# Patient Record
Sex: Male | Born: 1960 | Race: Black or African American | Hispanic: No | Smoking: Never smoker
Health system: Southern US, Community
[De-identification: ages and names within clinical notes are randomized; demographics above are authoritative.]

## PROBLEM LIST (undated history)

## (undated) DIAGNOSIS — I10 Essential (primary) hypertension: Secondary | ICD-10-CM

## (undated) DIAGNOSIS — M109 Gout, unspecified: Secondary | ICD-10-CM

## (undated) DIAGNOSIS — I2699 Other pulmonary embolism without acute cor pulmonale: Secondary | ICD-10-CM

## (undated) DIAGNOSIS — E119 Type 2 diabetes mellitus without complications: Secondary | ICD-10-CM

## (undated) HISTORY — DX: Type 2 diabetes mellitus without complications: E11.9

## (undated) HISTORY — PX: ROTATOR CUFF REPAIR: SHX139

---

## 1997-06-23 ENCOUNTER — Emergency Department (HOSPITAL_COMMUNITY): Admission: EM | Admit: 1997-06-23 | Discharge: 1997-06-23 | Payer: Self-pay | Admitting: Emergency Medicine

## 1997-06-30 ENCOUNTER — Encounter: Admission: RE | Admit: 1997-06-30 | Discharge: 1997-06-30 | Payer: Self-pay | Admitting: Internal Medicine

## 1997-07-07 ENCOUNTER — Encounter: Admission: RE | Admit: 1997-07-07 | Discharge: 1997-07-07 | Payer: Self-pay | Admitting: Hematology and Oncology

## 1997-07-10 ENCOUNTER — Ambulatory Visit: Admission: RE | Admit: 1997-07-10 | Discharge: 1997-07-10 | Payer: Self-pay

## 1997-07-16 ENCOUNTER — Encounter: Admission: RE | Admit: 1997-07-16 | Discharge: 1997-07-16 | Payer: Self-pay | Admitting: Hematology and Oncology

## 1997-09-28 ENCOUNTER — Encounter: Payer: Self-pay | Admitting: Emergency Medicine

## 1997-09-28 ENCOUNTER — Emergency Department (HOSPITAL_COMMUNITY): Admission: EM | Admit: 1997-09-28 | Discharge: 1997-09-28 | Payer: Self-pay | Admitting: Emergency Medicine

## 1997-12-24 ENCOUNTER — Emergency Department (HOSPITAL_COMMUNITY): Admission: EM | Admit: 1997-12-24 | Discharge: 1997-12-24 | Payer: Self-pay | Admitting: Emergency Medicine

## 1999-03-07 ENCOUNTER — Emergency Department (HOSPITAL_COMMUNITY): Admission: EM | Admit: 1999-03-07 | Discharge: 1999-03-07 | Payer: Self-pay | Admitting: Internal Medicine

## 1999-06-21 ENCOUNTER — Emergency Department (HOSPITAL_COMMUNITY): Admission: EM | Admit: 1999-06-21 | Discharge: 1999-06-21 | Payer: Self-pay | Admitting: Emergency Medicine

## 1999-07-13 ENCOUNTER — Emergency Department (HOSPITAL_COMMUNITY): Admission: EM | Admit: 1999-07-13 | Discharge: 1999-07-13 | Payer: Self-pay | Admitting: Internal Medicine

## 2000-08-24 ENCOUNTER — Encounter: Payer: Self-pay | Admitting: Emergency Medicine

## 2000-08-24 ENCOUNTER — Emergency Department (HOSPITAL_COMMUNITY): Admission: EM | Admit: 2000-08-24 | Discharge: 2000-08-24 | Payer: Self-pay | Admitting: Emergency Medicine

## 2000-09-04 ENCOUNTER — Encounter: Admission: RE | Admit: 2000-09-04 | Discharge: 2000-12-03 | Payer: Self-pay | Admitting: Family Medicine

## 2002-03-19 ENCOUNTER — Emergency Department (HOSPITAL_COMMUNITY): Admission: EM | Admit: 2002-03-19 | Discharge: 2002-03-19 | Payer: Self-pay

## 2002-04-06 ENCOUNTER — Emergency Department (HOSPITAL_COMMUNITY): Admission: EM | Admit: 2002-04-06 | Discharge: 2002-04-06 | Payer: Self-pay | Admitting: Emergency Medicine

## 2004-08-01 ENCOUNTER — Emergency Department (HOSPITAL_COMMUNITY): Admission: EM | Admit: 2004-08-01 | Discharge: 2004-08-01 | Payer: Self-pay | Admitting: Family Medicine

## 2005-03-06 ENCOUNTER — Encounter: Admission: RE | Admit: 2005-03-06 | Discharge: 2005-03-06 | Payer: Self-pay | Admitting: Family Medicine

## 2005-04-03 ENCOUNTER — Emergency Department (HOSPITAL_COMMUNITY): Admission: EM | Admit: 2005-04-03 | Discharge: 2005-04-03 | Payer: Self-pay | Admitting: Emergency Medicine

## 2005-09-28 ENCOUNTER — Emergency Department (HOSPITAL_COMMUNITY): Admission: EM | Admit: 2005-09-28 | Discharge: 2005-09-28 | Payer: Self-pay | Admitting: Emergency Medicine

## 2005-10-21 ENCOUNTER — Inpatient Hospital Stay (HOSPITAL_COMMUNITY): Admission: EM | Admit: 2005-10-21 | Discharge: 2005-10-27 | Payer: Self-pay | Admitting: Emergency Medicine

## 2005-10-22 ENCOUNTER — Encounter: Payer: Self-pay | Admitting: Vascular Surgery

## 2005-12-26 ENCOUNTER — Emergency Department (HOSPITAL_COMMUNITY): Admission: EM | Admit: 2005-12-26 | Discharge: 2005-12-26 | Payer: Self-pay | Admitting: Emergency Medicine

## 2006-04-03 ENCOUNTER — Ambulatory Visit (HOSPITAL_BASED_OUTPATIENT_CLINIC_OR_DEPARTMENT_OTHER): Admission: RE | Admit: 2006-04-03 | Discharge: 2006-04-03 | Payer: Self-pay | Admitting: Orthopedic Surgery

## 2006-06-19 ENCOUNTER — Encounter: Admission: RE | Admit: 2006-06-19 | Discharge: 2006-06-19 | Payer: Self-pay | Admitting: Family Medicine

## 2006-09-08 ENCOUNTER — Emergency Department (HOSPITAL_COMMUNITY): Admission: EM | Admit: 2006-09-08 | Discharge: 2006-09-08 | Payer: Self-pay | Admitting: Emergency Medicine

## 2006-10-09 ENCOUNTER — Emergency Department (HOSPITAL_COMMUNITY): Admission: EM | Admit: 2006-10-09 | Discharge: 2006-10-09 | Payer: Self-pay | Admitting: Emergency Medicine

## 2007-02-14 ENCOUNTER — Emergency Department (HOSPITAL_COMMUNITY): Admission: EM | Admit: 2007-02-14 | Discharge: 2007-02-15 | Payer: Self-pay | Admitting: Emergency Medicine
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
## 2007-03-17 IMAGING — CR DG CHEST 2V
2 series · 2 of 2 positions shown · non-contrast
Comparison: None.

CLINICAL DATA: 44-year-old with difficulty breathing and shortness of breath. Chest pain.
 CHEST - 2 VIEW:

[view not recorded (1 of 2)]
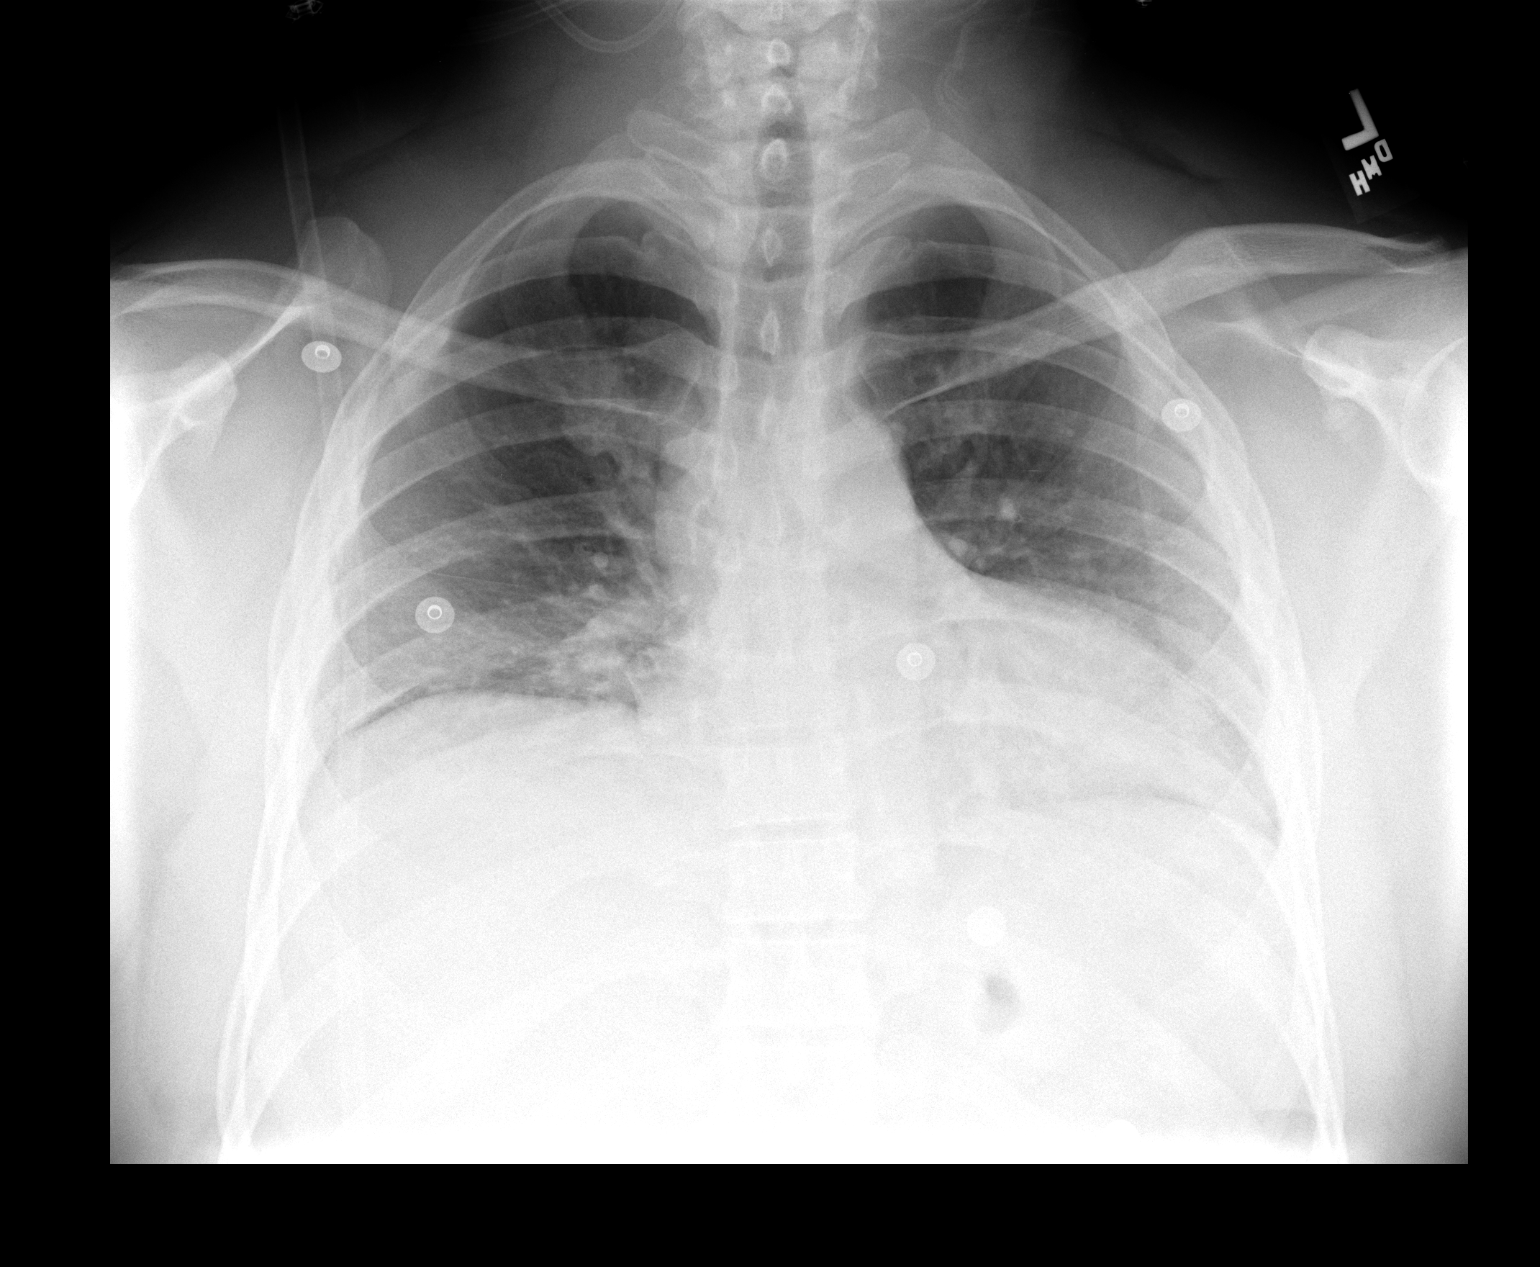

[view not recorded (2 of 2)]
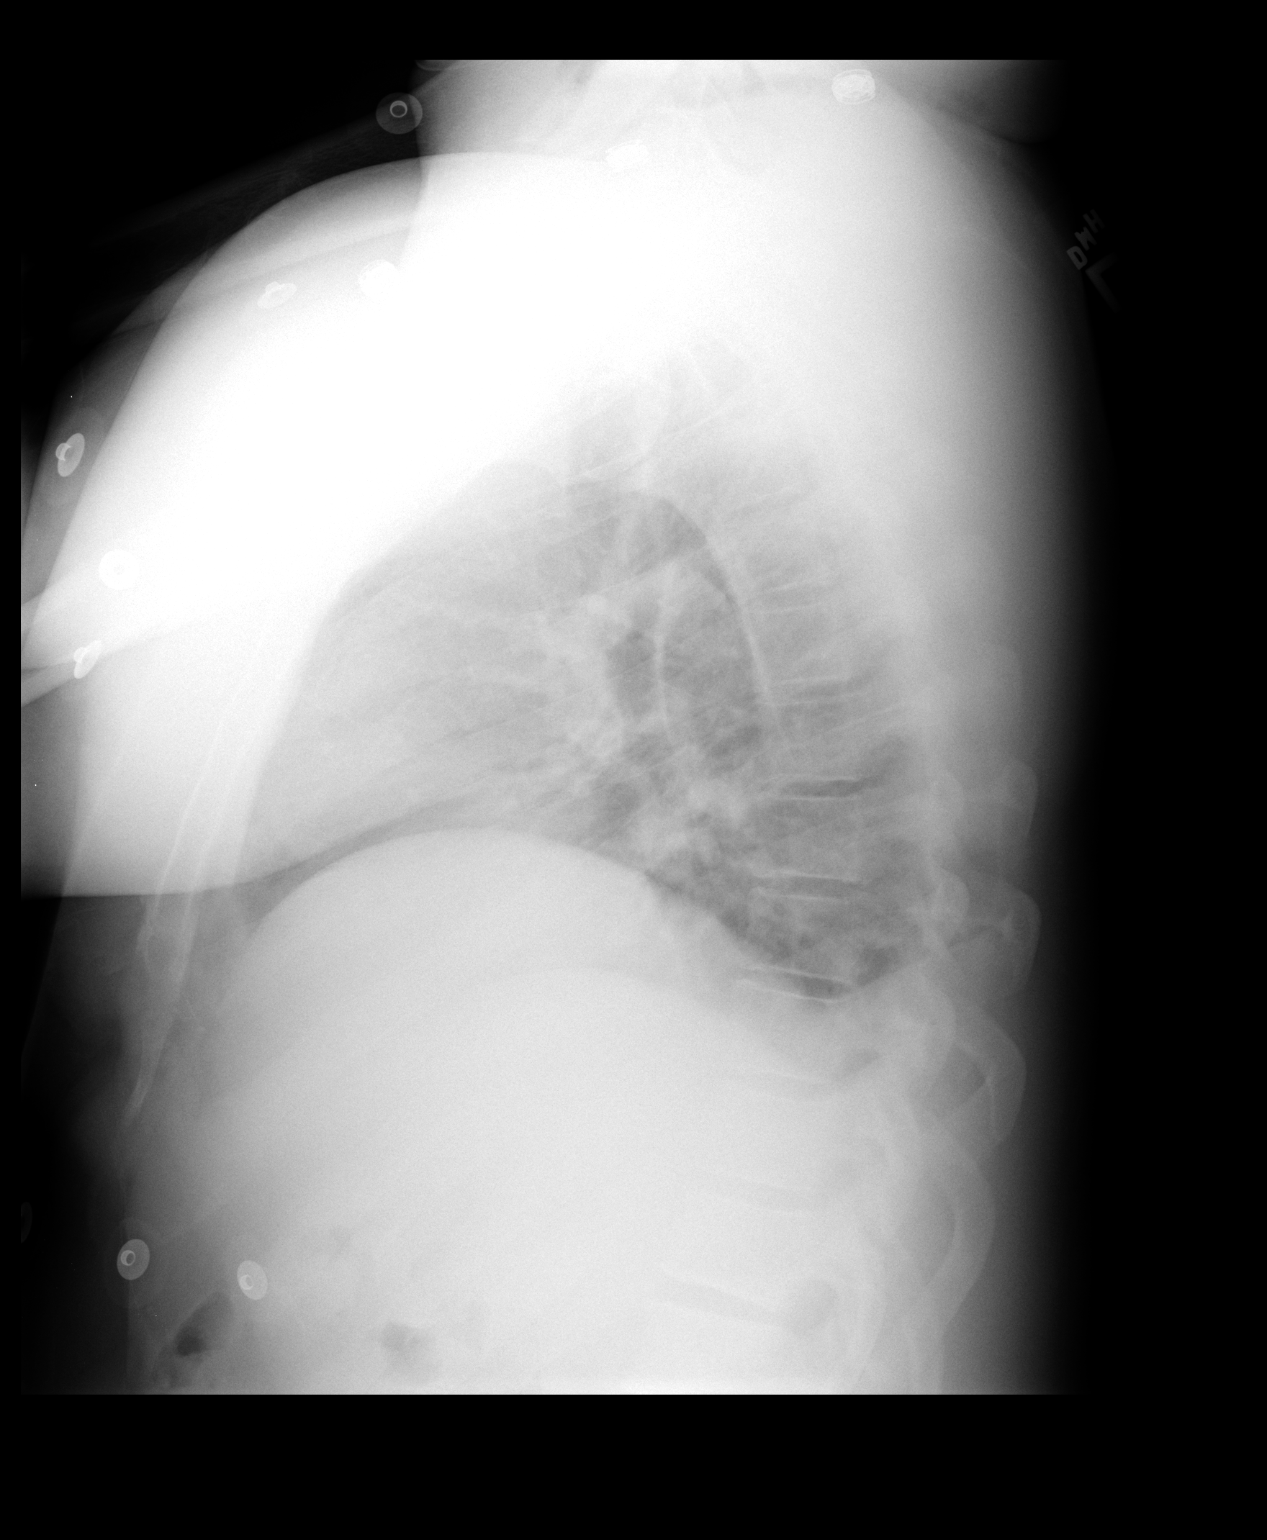

[2 of 2 positions shown; findings below may reference images not displayed]

FINDINGS: Very low volume chest film with vascular crowding, bibasilar atelectasis, and accentuation of heart size. No edema or effusions. Remote healed left clavicle fracture is noted.
IMPRESSION: Very low volume chest film with vascular crowding, bibasilar atelectasis, and accentuation of heart size.

## 2007-12-03 ENCOUNTER — Emergency Department (HOSPITAL_COMMUNITY): Admission: EM | Admit: 2007-12-03 | Discharge: 2007-12-03 | Payer: Self-pay | Admitting: Emergency Medicine

## 2008-10-06 ENCOUNTER — Encounter: Admission: RE | Admit: 2008-10-06 | Discharge: 2008-10-06 | Payer: Self-pay | Admitting: Family Medicine

## 2008-10-08 ENCOUNTER — Encounter: Admission: RE | Admit: 2008-10-08 | Discharge: 2008-11-19 | Payer: Self-pay | Admitting: Family Medicine

## 2009-04-07 ENCOUNTER — Emergency Department (HOSPITAL_COMMUNITY): Admission: EM | Admit: 2009-04-07 | Discharge: 2009-04-07 | Payer: Self-pay | Admitting: Family Medicine

## 2009-05-15 ENCOUNTER — Emergency Department (HOSPITAL_COMMUNITY): Admission: EM | Admit: 2009-05-15 | Discharge: 2009-05-15 | Payer: Self-pay | Admitting: Emergency Medicine

## 2009-07-12 ENCOUNTER — Emergency Department (HOSPITAL_COMMUNITY): Admission: EM | Admit: 2009-07-12 | Discharge: 2009-07-12 | Payer: Self-pay | Admitting: Emergency Medicine

## 2010-03-22 LAB — CBC
HCT: 38.9 % — ABNORMAL LOW (ref 39.0–52.0)
Hemoglobin: 12.9 g/dL — ABNORMAL LOW (ref 13.0–17.0)
Platelets: 213 10*3/uL (ref 150–400)
RDW: 13.5 % (ref 11.5–15.5)
WBC: 5.8 10*3/uL (ref 4.0–10.5)

## 2010-03-22 LAB — DIFFERENTIAL
Basophils Absolute: 0.1 10*3/uL (ref 0.0–0.1)
Eosinophils Relative: 1 % (ref 0–5)
Lymphocytes Relative: 40 % (ref 12–46)
Lymphs Abs: 2.3 10*3/uL (ref 0.7–4.0)
Neutro Abs: 3 10*3/uL (ref 1.7–7.7)

## 2010-03-22 LAB — URIC ACID: Uric Acid, Serum: 7.5 mg/dL (ref 4.0–7.8)

## 2010-03-22 LAB — SEDIMENTATION RATE: Sed Rate: 19 mm/hr — ABNORMAL HIGH (ref 0–16)
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
## 2010-09-23 LAB — CBC
HCT: 47.8
Platelets: 243
RDW: 13.1

## 2010-09-23 LAB — URINALYSIS, ROUTINE W REFLEX MICROSCOPIC
Hgb urine dipstick: NEGATIVE
Leukocytes, UA: NEGATIVE
Nitrite: NEGATIVE
Protein, ur: 100 — AB
Specific Gravity, Urine: 1.024
Urobilinogen, UA: 1

## 2010-09-23 LAB — URINE MICROSCOPIC-ADD ON

## 2010-09-23 LAB — BASIC METABOLIC PANEL
BUN: 19
Calcium: 9
Creatinine, Ser: 1.44
GFR calc non Af Amer: 53 — ABNORMAL LOW
Glucose, Bld: 158 — ABNORMAL HIGH
Potassium: 3.3 — ABNORMAL LOW

## 2010-09-23 LAB — DIFFERENTIAL
Basophils Absolute: 0.1
Eosinophils Relative: 0
Lymphocytes Relative: 24
Neutro Abs: 8.5 — ABNORMAL HIGH

## 2010-10-13 LAB — RPR: RPR Ser Ql: NONREACTIVE

## 2011-05-01 ENCOUNTER — Emergency Department (HOSPITAL_COMMUNITY)
Admission: EM | Admit: 2011-05-01 | Discharge: 2011-05-01 | Disposition: A | Discharge status: Home / Self Care | Payer: Self-pay | Attending: Emergency Medicine | Admitting: Emergency Medicine

## 2011-05-01 ENCOUNTER — Encounter (HOSPITAL_COMMUNITY): Payer: Self-pay | Admitting: *Deleted

## 2011-05-01 DIAGNOSIS — IMO0001 Reserved for inherently not codable concepts without codable children: Secondary | ICD-10-CM | POA: Insufficient documentation

## 2011-05-01 DIAGNOSIS — I10 Essential (primary) hypertension: Secondary | ICD-10-CM | POA: Insufficient documentation

## 2011-05-01 DIAGNOSIS — L0231 Cutaneous abscess of buttock: Secondary | ICD-10-CM | POA: Insufficient documentation

## 2011-05-01 DIAGNOSIS — L03317 Cellulitis of buttock: Secondary | ICD-10-CM | POA: Insufficient documentation

## 2011-05-01 DIAGNOSIS — R609 Edema, unspecified: Secondary | ICD-10-CM | POA: Insufficient documentation

## 2011-05-01 DIAGNOSIS — Z79899 Other long term (current) drug therapy: Secondary | ICD-10-CM | POA: Insufficient documentation

## 2011-05-01 HISTORY — DX: Essential (primary) hypertension: I10

## 2011-05-01 MED ORDER — ATENOLOL 100 MG PO TABS
100.0000 mg | ORAL_TABLET | Freq: Once | ORAL | Status: AC
  Administered 2011-05-01: 100 mg via ORAL
  Filled 2011-05-01: qty 1

## 2011-05-01 MED ORDER — TRAMADOL HCL 50 MG PO TABS: 50.0000 mg | ORAL_TABLET | Freq: Four times a day (QID) | ORAL | Status: AC | PRN

## 2011-05-01 MED ORDER — SULFAMETHOXAZOLE-TRIMETHOPRIM 800-160 MG PO TABS: 1.0000 | ORAL_TABLET | Freq: Two times a day (BID) | ORAL | Status: AC

## 2011-05-01 MED ORDER — CEPHALEXIN 500 MG PO CAPS: 500.0000 mg | ORAL_CAPSULE | Freq: Four times a day (QID) | ORAL | Status: AC

## 2011-05-01 NOTE — ED Notes (Signed)
Pt in c/o abscess x3 weeks, over last few days new drainage noted, abscess to buttock area

## 2011-05-01 NOTE — Discharge Instructions (Signed)
Return in 2-3 days if your abscess condition worsen. Continue with sitz bath, changing dressing at least twice daily, and taking antibiotic as prescribed.  You will also need to take your blood pressure medications as your blood pressure is high today.  Follow up with your doctor for further management.     Abscess Care After An abscess (also called a boil or furuncle) is an infected area that contains a collection of pus. Signs and symptoms of an abscess include pain, tenderness, redness, or hardness, or you may feel a moveable soft area under your skin. An abscess can occur anywhere in the body. The infection may spread to surrounding tissues causing cellulitis. A cut (incision) by the surgeon was made over your abscess and the pus was drained out. Gauze may have been packed into the space to provide a drain that will allow the cavity to heal from the inside outwards. The boil may be painful for 5 to 7 days. Most people with a boil do not have high fevers. Your abscess, if seen early, may not have localized, and may not have been lanced. If not, another appointment may be required for this if it does not get better on its own or with medications. HOME CARE INSTRUCTIONS   Only take over-the-counter or prescription medicines for pain, discomfort, or fever as directed by your caregiver.   When you bathe, soak and then remove gauze or iodoform packs at least daily or as directed by your caregiver. You may then wash the wound gently with mild soapy water. Repack with gauze or do as your caregiver directs.  SEEK IMMEDIATE MEDICAL CARE IF:   You develop increased pain, swelling, redness, drainage, or bleeding in the wound site.   You develop signs of generalized infection including muscle aches, chills, fever, or a general ill feeling.   An oral temperature above 102 F (38.9 C) develops, not controlled by medication.  See your caregiver for a recheck if you develop any of the symptoms described above.  If medications (antibiotics) were prescribed, take them as directed. Document Released: 07/07/2004 Document Revised: 12/08/2010 Document Reviewed: 03/04/2007 [facility] Patient Information 2012 [place], [place].Sitz Bath A sitz bath is a warm water bath taken in the sitting position that covers only the hips and buttocks. It may be used for either healing or hygiene purposes. Sitz baths are also used to relieve pain, itching, or muscle spasms. The water may contain medicine. Moist heat will help you heal and relax.  HOME CARE INSTRUCTIONS   Fill the bathtub half full with warm water.   Sit in the water and open the drain a little.   Turn on the warm water to keep the tub half full. Keep the water running constantly.   Soak in the water for 15 to 20 minutes.   After the sitz bath, pat the affected area dry first.   Take 3 to 4 sitz baths a day.  SEEK MEDICAL CARE IF:  You get worse instead of better. Stop the sitz baths if you get worse. MAKE SURE YOU:  Understand these instructions.   Will watch your condition.   Will get help right away if you are not doing well or get worse.  Document Released: 09/11/2003 Document Revised: 12/08/2010 Document Reviewed: 03/18/2010 [facility] Patient Information 2012 [place], [place].

## 2011-05-01 NOTE — ED Provider Notes (Signed)
History     CSN: [id number]  Arrival date & time 05/01/11  1450   First MD Initiated Contact with Patient 05/01/11 1527      Chief Complaint  Patient presents with  . Abscess    (Consider location/radiation/quality/duration/timing/severity/associated sxs/prior treatment) HPI  51 year old male presents with chief complaints of abscess. Patient states for the past 3 weeks he has been having pain and swelling to his L buttock.  States that abscess has increased in size and tenderness but a few days ago it has ruptured on its own and has been draining pus and blood. He has noticed that the pain has improved. He denies fever, rectal pain, or abdominal pain. He denies any prior trauma. 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  Has prior hx of abscess in leg and arm in the past. His primary concern is the bloody drainage.  His wife has been changing dressing several times daily.    He takes ibuprofen for pain.  Pt also were found to have an elevated BP of 220/125 today.  He denies headache, chest pain, shortness of breath, numbness or tingling sensation.  Pt sts he works early in the morning and usually takes his medications in the dark.  He believes he may have been taking wrong medication.  Admits to take lisinopril daily but thinks he hasn't been taking his atenolol as prescribed.  Does have medication at home.  Pt does have a PCP to f/u.      Past Medical History  Diagnosis Date  . Hypertension     History reviewed. No pertinent past surgical history.  History reviewed. No pertinent family history.  History  Substance Use Topics  . Smoking status: Not on file  . Smokeless tobacco: Not on file  . Alcohol Use:       Review of Systems  All other systems reviewed and are negative.    Allergies  Iohexol  Home Medications   Current Outpatient Rx  Name Route Sig Dispense Refill  . ATENOLOL 100 MG PO TABS Oral Take 100 mg by mouth  daily.    . IBUPROFEN 200 MG PO TABS Oral Take 800 mg by mouth every 6 (six) hours as needed. PAIN    . LISINOPRIL-HYDROCHLOROTHIAZIDE 20-25 MG PO TABS Oral Take 1 tablet by mouth daily.    . ADULT MULTIVITAMIN W/MINERALS CH Oral Take 1 tablet by mouth daily.    Marland Kitchen NIACIN ER 500 MG PO CPCR Oral Take 500 mg by mouth at bedtime.      BP 220/125  Pulse 65  Temp(Src) 98.1 F (36.7 C) (Oral)  Resp 20  SpO2 99%  Physical Exam  Nursing note and vitals reviewed. Constitutional: He appears well-developed and well-nourished. No distress.  HENT:  Head: Atraumatic.  Eyes: Conjunctivae are normal.  Neck: Neck supple.  Cardiovascular: Normal rate and regular rhythm.   Pulmonary/Chest: Effort normal. He has no wheezes. He exhibits no tenderness.  Abdominal: Soft. There is no tenderness.  Musculoskeletal: Normal range of motion. He exhibits no edema.  Neurological: He is alert.  Skin: Skin is warm.       ED Course  Procedures (including critical care time)  Labs Reviewed - No data to display No results found.   No diagnosis found.    MDM  R upper buttock with a drainable abscess.  No significant cellulitis.  Able to expel serous sanguinous with mixture of pus with manual manipulation.  No obvious fluctuance on exam.  No lymphangitis.  No rectal involvement.  No requirement for I&D at this time.  Plan to offer abx and f/u instruction.  Pt amendable to plan.  Sitz bath recommended.     Patient has an elevated blood pressure of 220/125. He does have history of hypertension. He does have hypertension medication at home but states he has been taking it as prescribed. There is no evidence of end organ damage. Since he misses his morning dose of atenolol, will give a dose here and encourage resuming his meds tomorrow. Recommend f/u with PCP for further management.  Pt voice understanding.    4:29 PM Blood pressures improves. will discharge.    Fayrene Helper, PA-C 05/01/11 1629

## 2011-05-02 NOTE — ED Provider Notes (Signed)
Medical screening examination/treatment/procedure(s) were performed by non-physician practitioner and as supervising physician I was immediately available for consultation/collaboration.   Rolan Bucco, MD 05/02/11 0010

## 2012-10-24 ENCOUNTER — Emergency Department (HOSPITAL_COMMUNITY): Payer: Self-pay

## 2012-10-24 ENCOUNTER — Encounter (HOSPITAL_COMMUNITY): Payer: Self-pay | Admitting: Emergency Medicine

## 2012-10-24 ENCOUNTER — Emergency Department (HOSPITAL_COMMUNITY)
Admission: EM | Admit: 2012-10-24 | Discharge: 2012-10-24 | Disposition: A | Discharge status: Home / Self Care | Payer: Self-pay | Attending: Emergency Medicine | Admitting: Emergency Medicine

## 2012-10-24 DIAGNOSIS — M25469 Effusion, unspecified knee: Secondary | ICD-10-CM | POA: Insufficient documentation

## 2012-10-24 DIAGNOSIS — I10 Essential (primary) hypertension: Secondary | ICD-10-CM | POA: Insufficient documentation

## 2012-10-24 DIAGNOSIS — IMO0001 Reserved for inherently not codable concepts without codable children: Secondary | ICD-10-CM | POA: Insufficient documentation

## 2012-10-24 DIAGNOSIS — M25562 Pain in left knee: Secondary | ICD-10-CM

## 2012-10-24 DIAGNOSIS — M25569 Pain in unspecified knee: Secondary | ICD-10-CM | POA: Insufficient documentation

## 2012-10-24 DIAGNOSIS — Z79899 Other long term (current) drug therapy: Secondary | ICD-10-CM | POA: Insufficient documentation

## 2012-10-24 DIAGNOSIS — M25462 Effusion, left knee: Secondary | ICD-10-CM

## 2012-10-24 MED ORDER — TRAMADOL HCL 50 MG PO TABS: 50.0000 mg | ORAL_TABLET | Freq: Four times a day (QID) | ORAL | Status: DC | PRN

## 2012-10-24 MED ORDER — NAPROXEN 500 MG PO TABS
500.0000 mg | ORAL_TABLET | Freq: Once | ORAL | Status: AC
  Administered 2012-10-24: 500 mg via ORAL
  Filled 2012-10-24: qty 1

## 2012-10-24 MED ORDER — NAPROXEN 500 MG PO TABS: 500.0000 mg | ORAL_TABLET | Freq: Two times a day (BID) | ORAL | Status: DC

## 2012-10-24 NOTE — Progress Notes (Signed)
[facility] provided pt with a list of primary care resources and a [facility] [employer] Card application.  °

## 2012-10-24 NOTE — ED Provider Notes (Signed)
CSN: [id number]     Arrival date & time 10/24/12  1015 History   First MD Initiated Contact with Patient 10/24/12 1037     Chief Complaint  Patient presents with  . Knee Pain    left   (Consider location/radiation/quality/duration/timing/severity/associated sxs/prior Treatment) HPI Pt is a 52yo male c/o of persistent left knee pain associated with increased pain and swelling after twisting it 1 week ago.  Pain is constant, aching, throbbing, 8/10, worse with ambulation or standing after prolonged sitting. Describes knee as "stiff feeling."  Has tried ice and ibuprofen w/o relief. Reports he does do a lot of walking at work but no heavy lifting.  Knee is more sore when he gets home from work. Denies hx of previous injury to same knee. Denies hx of gout, fever, n/v/d.  Past Medical History  Diagnosis Date  . Hypertension    History reviewed. No pertinent past surgical history. No family history on file. History  Substance Use Topics  . Smoking status: Never Smoker   . Smokeless tobacco: Not on file  . Alcohol Use: Yes    Review of Systems  Constitutional: Negative for fever, chills and fatigue.  Musculoskeletal: Positive for arthralgias, joint swelling and myalgias. Negative for back pain and gait problem.  Skin: Negative for wound.  All other systems reviewed and are negative.    Allergies  Iohexol  Home Medications   Current Outpatient Rx  Name  Route  Sig  Dispense  Refill  . atenolol (TENORMIN) 100 MG tablet   Oral   Take 100 mg by mouth daily.         Marland Kitchen ibuprofen (ADVIL,MOTRIN) 200 MG tablet   Oral   Take 800 mg by mouth every 6 (six) hours as needed. PAIN         . lisinopril-hydrochlorothiazide (PRINZIDE,ZESTORETIC) 20-25 MG per tablet   Oral   Take 1 tablet by mouth daily.         . naproxen (NAPROSYN) 500 MG tablet   Oral   Take 1 tablet (500 mg total) by mouth 2 (two) times daily with a meal.   30 tablet   0   . traMADol (ULTRAM) 50 MG tablet   Oral   Take 1 tablet (50 mg total) by mouth every 6 (six) hours as needed for pain.   15 tablet   0    BP 158/103  Pulse 86  Temp(Src) 98.6 F (37 C)  Resp 20  SpO2 98% Physical Exam  Nursing note and vitals reviewed. Constitutional: He is oriented to person, place, and time. He appears well-developed and well-nourished.  HENT:  Head: Normocephalic and atraumatic.  Eyes: EOM are normal.  Neck: Normal range of motion.  Cardiovascular: Normal rate.   Pulmonary/Chest: Effort normal.  Musculoskeletal: Normal range of motion. He exhibits edema ( mild edema of left lateral knee) and tenderness ( left superior, lateral knee).       Legs: Neurological: He is alert and oriented to person, place, and time.  Skin: Skin is warm and dry. No erythema.  No erythema or warmth of left knee. Skin in tact.  Psychiatric: He has a normal mood and affect. His behavior is normal.    ED Course  Procedures (including critical care time) Labs Review Labs Reviewed - No data to display Imaging Review Dg Knee Complete 4 Views Left  10/24/2012   CLINICAL DATA:  Twisting injury 1 week ago with increasing pain and swelling.  EXAM: LEFT KNEE -  COMPLETE 4+ VIEW  COMPARISON:  Left knee radiographs 10/06/2008.  FINDINGS: The mineralization and alignment are normal. There is no evidence of acute fracture or dislocation. Bipartite patella appears stable. There is a new moderate size knee joint effusion. There appears to be mild subchondral cyst formation involving 1 of the femoral condyles posteriorly on the lateral view.  IMPRESSION: No acute osseous findings. Moderate size knee joint effusion may be a manifestation of internal derangement.   Electronically Signed   By: Roxy Horseman M.D.   On: 10/24/2012 11:51    EKG Interpretation   None       MDM   1. Left knee pain   2. Joint effusion, knee, left    Left knee pain and mild swelling after twisting 1 week ago. No hx of previous injury or gout. No  evidence of gout or cellulitis on exam.  Will get plain films.  Rx: knee sleeve, tramadol, ibuprofen, and [facility] f/u. Pt verbalized understanding and agreement with tx plan.    Junius Finner, PA-C 10/24/12 1828

## 2012-10-24 NOTE — ED Notes (Signed)
Pt states he twisted knee about 1 week ago and now states that knee is swelling up and painful.

## 2012-10-25 NOTE — ED Provider Notes (Signed)
Medical screening examination/treatment/procedure(s) were performed by non-physician practitioner and as supervising physician I was immediately available for consultation/collaboration.  EKG Interpretation   None         Roney Marion, MD 10/25/12 [phone]

## 2014-01-16 ENCOUNTER — Encounter (HOSPITAL_COMMUNITY): Payer: Self-pay | Admitting: Emergency Medicine

## 2014-01-16 ENCOUNTER — Emergency Department (HOSPITAL_COMMUNITY)
Admission: EM | Admit: 2014-01-16 | Discharge: 2014-01-16 | Disposition: A | Discharge status: Home / Self Care | Payer: Self-pay | Attending: Emergency Medicine | Admitting: Emergency Medicine

## 2014-01-16 DIAGNOSIS — Z79899 Other long term (current) drug therapy: Secondary | ICD-10-CM | POA: Insufficient documentation

## 2014-01-16 DIAGNOSIS — I10 Essential (primary) hypertension: Secondary | ICD-10-CM | POA: Insufficient documentation

## 2014-01-16 DIAGNOSIS — K0889 Other specified disorders of teeth and supporting structures: Secondary | ICD-10-CM

## 2014-01-16 DIAGNOSIS — K088 Other specified disorders of teeth and supporting structures: Secondary | ICD-10-CM | POA: Insufficient documentation

## 2014-01-16 MED ORDER — OXYCODONE-ACETAMINOPHEN 5-325 MG PO TABS: 2.0000 | ORAL_TABLET | ORAL | Status: DC | PRN

## 2014-01-16 MED ORDER — PENICILLIN V POTASSIUM 500 MG PO TABS: 500.0000 mg | ORAL_TABLET | Freq: Four times a day (QID) | ORAL | Status: AC

## 2014-01-16 MED ORDER — AMLODIPINE BESYLATE 10 MG PO TABS: 10.0000 mg | ORAL_TABLET | Freq: Once | ORAL | Status: DC

## 2014-01-16 MED ORDER — AMLODIPINE BESYLATE 10 MG PO TABS
10.0000 mg | ORAL_TABLET | Freq: Once | ORAL | Status: AC
  Administered 2014-01-16: 10 mg via ORAL
  Filled 2014-01-16: qty 1

## 2014-01-16 MED ORDER — CLONIDINE HCL 0.1 MG PO TABS
0.2000 mg | ORAL_TABLET | Freq: Three times a day (TID) | ORAL | Status: DC | PRN
  Administered 2014-01-16: 0.2 mg via ORAL
  Filled 2014-01-16: qty 2

## 2014-01-16 MED ORDER — BUPIVACAINE-EPINEPHRINE (PF) 0.5% -1:200000 IJ SOLN
1.8000 mL | Freq: Once | INTRAMUSCULAR | Status: AC
  Administered 2014-01-16: 1.8 mL
  Filled 2014-01-16: qty 1.8

## 2014-01-16 NOTE — ED Notes (Signed)
Pt reports dental pain x 3 weeks that is worse the past two day. Pain is lower dental pain with broken tooth and pain to right side of face. Pain 10/10.

## 2014-01-16 NOTE — ED Notes (Signed)
Per telephone.  OK to d/c pt home with elevated bp.  Notified of 180/118.  Give script for home medication to control.

## 2014-01-16 NOTE — ED Notes (Signed)
Pt bp elevated 187/117 and reports being off pain meds for 1 year.

## 2014-01-16 NOTE — Discharge Instructions (Signed)
Dental Care and Dentist Visits Dental care supports good overall health. Regular dental visits can also help you avoid dental pain, bleeding, infection, and other more serious health problems in the future. It is important to keep the mouth healthy because diseases in the teeth, gums, and other oral tissues can spread to other areas of the body. Some problems, such as diabetes, heart disease, and pre-term labor have been associated with poor oral health.  See your dentist every 6 months. If you experience emergency problems such as a toothache or broken tooth, go to the dentist right away. If you see your dentist regularly, you may catch problems early. It is easier to be treated for problems in the early stages.  WHAT TO EXPECT AT A DENTIST VISIT  Your dentist will look for many common oral health problems and recommend proper treatment. At your regular dental visit, you can expect:  Gentle cleaning of the teeth and gums. This includes scraping and polishing. This helps to remove the sticky substance around the teeth and gums (plaque). Plaque forms in the mouth shortly after eating. Over time, plaque hardens on the teeth as tartar. If tartar is not removed regularly, it can cause problems. Cleaning also helps remove stains.  Periodic X-rays. These pictures of the teeth and supporting bone will help your dentist assess the health of your teeth.  Periodic fluoride treatments. Fluoride is a natural mineral shown to help strengthen teeth. Fluoride treatmentinvolves applying a fluoride gel or varnish to the teeth. It is most commonly done in children.  Examination of the mouth, tongue, jaws, teeth, and gums to look for any oral health problems, such as:  Cavities (dental caries). This is decay on the tooth caused by plaque, sugar, and acid in the mouth. It is best to catch a cavity when it is small.  Inflammation of the gums caused by plaque buildup (gingivitis).  Problems with the mouth or malformed  or misaligned teeth.  Oral cancer or other diseases of the soft tissues or jaws. KEEP YOUR TEETH AND GUMS HEALTHY For healthy teeth and gums, follow these general guidelines as well as your dentist's specific advice:  Have your teeth professionally cleaned at the dentist every 6 months.  Brush twice daily with a fluoride toothpaste.  Floss your teeth daily.  Ask your dentist if you need fluoride supplements, treatments, or fluoride toothpaste.  Eat a healthy diet. Reduce foods and drinks with added sugar.  Avoid smoking. TREATMENT FOR ORAL HEALTH PROBLEMS If you have oral health problems, treatment varies depending on the conditions present in your teeth and gums.  Your caregiver will most likely recommend good oral hygiene at each visit.  For cavities, gingivitis, or other oral health disease, your caregiver will perform a procedure to treat the problem. This is typically done at a separate appointment. Sometimes your caregiver will refer you to another dental specialist for specific tooth problems or for surgery. SEEK IMMEDIATE DENTAL CARE IF:  You have pain, bleeding, or soreness in the gum, tooth, jaw, or mouth area.  A permanent tooth becomes loose or separated from the gum socket.  You experience a blow or injury to the mouth or jaw area. Document Released: 08/31/2010 Document Revised: 03/13/2011 Document Reviewed: 08/31/2010 [facility] Patient Information 2015 [place], [place]. This information is not intended to replace advice given to you by your health care provider. Make sure you discuss any questions you have with your health care provider.   Emergency Department Resource Guide 1) Find a  Doctor and Pay Out of Pocket Although you won't have to find out who is covered by your insurance plan, it is a good idea to ask around and get recommendations. You will then need to call the office and see if the doctor you have chosen will accept you as a new patient and what types of  options they offer for patients who are self-pay. Some doctors offer discounts or will set up payment plans for their patients who do not have insurance, but you will need to ask so you aren't surprised when you get to your appointment.  2) Contact Your Local Health Department Not all health departments have doctors that can see patients for sick visits, but many do, so it is worth a call to see if yours does. If you don't know where your local health department is, you can check in your phone book. The CDC also has a tool to help you locate your state's health department, and many state websites also have listings of all of their local health departments.  3) Find a Walk-in Clinic If your illness is not likely to be very severe or complicated, you may want to try a walk in clinic. These are popping up all over the country in pharmacies, drugstores, and shopping centers. They're usually staffed by nurse practitioners or physician assistants that have been trained to treat common illnesses and complaints. They're usually fairly quick and inexpensive. However, if you have serious medical issues or chronic medical problems, these are probably not your best option.  No Primary Care Doctor: - Call Health Connect at  [phone] - they can help you locate a primary care doctor that  accepts your insurance, provides certain services, etc. - Physician Referral Service- [phone]  Chronic Pain Problems: Organization         Address  Phone   Notes  [facility]  [phone] Patients need to be referred by their primary care doctor.   Medication Assistance: Organization         Address  Phone   Notes  [facility]ication [facility] [address] [place], [place] [address] [phone] --Must be a resident of [facility] -- Must have NO insurance coverage whatsoever (no Medicaid/ Medicare, etc.) -- The pt. MUST have a primary care doctor that directs  their care regularly and follows them in the community   MedAssist  [phone]) [phone]   [employer]  [phone]    Agencies that provide inexpensive medical care: Organization         Address  Phone   Notes  [facility]  [phone]   [facility]    [phone]   [facility] [address] [place], [place] [address] [phone]   [facility] [place] [address], [place] [phone]   Planned Parenthood    [phone]) [phone]   [facility]    [phone]   [facility]  [address], [place] Phone:  [phone], Fax:  [phone] Hours of Operation:  9 am - 6 pm, M-F.  Also accepts Medicaid/Medicare and self-pay.  [facility]  [address], [place] Phone: [phone]) [phone], Fax: [phone]. Hours of Operation:  8:30 am - 5:30 pm, M-F.  Also accepts Medicaid and self-pay.  [facility] [address], [place] Phone: [phone])  [place], [place], [place] [phone], Ext. [phone] Mondays & Thursdays: 7-9 AM.  First 15 patients are seen on a first come, first serve basis.    [place] Providers:  Organization         Address  Phone   Notes  [facility] [address], [place] [phone] Also accepts self-pay patients.  [facility] [address], [place]  [phone]   [place], [address], [place] [phone]   [facility] [address], [place] [phone]   Lucianne Lei [address], [place]   [phone] Only accepts [place] Access [place] patients after they have their name applied to their card.   Self-Pay (no insurance) in [facility]:  Organization          Address  Phone   Notes  Sickle Cell Patients, [facility] [place] [phone]   [facility] [place] [phone]   [facility] [place]  [place], [place], [place] [phone]   [facility]/Dr. Osei-Bonsu  [address] [place] or [address], [place] [phone] Phone number for both [place] and [place] locations is the same.  [facility] [address], [place] [phone]   [facility] [address] [place] or [address] [phone]   [facility] [address] [place] [phone], phone; [phone], fax Sees patients 1st and 3rd Saturday of every month.  Must not qualify for public or private insurance (i.e. Medicaid, Medicare, [place] Health Choice, Veterans' Benefits)  Household income should be no more than 200% of the poverty level The clinic cannot treat you if you are pregnant or think you are pregnant  Sexually transmitted diseases are not treated at the clinic.    Dental Care: Organization         Address  Phone  Notes  [facility] [place] [phone] Accepts children up to age 32 who are enrolled in [place] or [place]; pregnant women with a Medicaid card; and children who have applied for Medicaid or [place] Health Choice, but were declined, whose parents can pay a reduced fee at time of service.  [facility] Department of [facility]  [address], [place] [phone] Accepts children up to age 4 who are enrolled in [place] or [place]; pregnant women with a Medicaid card; and children who have applied for Medicaid or [place] Health Choice, but were declined, whose parents can pay a reduced fee at time of  service.  [place] [facility] Access PRO[facility]  [place] [phone] Patients are seen by appointment only. Walk-ins are not accepted. [place] will see patients 52 years of age and older. Monday - Tuesday (8am-5pm) Most Wednesdays (8:30-5pm) $30 per visit, cash only  [facility] Adult Dental Access PROGRAM  [address], [facility] [phone] Patients are seen by appointment only. Walk-ins are not accepted. [place] will see patients 3 years of age and older. One Wednesday Evening (Monthly: Volunteer Based).  $  30 per visit, cash only  Needles  [phone] for adults; Children under age 56, call Graduate Pediatric Dentistry at [phone]. Children aged 5-14, please call [phone] to request a pediatric application.  Dental services are provided in all areas of dental care including fillings, crowns and bridges, complete and partial dentures, implants, gum treatment, root canals, and extractions. Preventive care is also provided. Treatment is provided to both adults and children. Patients are selected via a lottery and there is often a waiting list.   [facility] [address] [place]  [phone] [email or web address]   [facility] [address] [place], [place] [phone], Ext. [phone] Second and Fourth Thursday of each month, opens at 6:30 AM; Clinic ends at 9 AM.  Patients are seen on a first-come first-served basis, and a limited number are seen during each clinic.   [facility]  [address] [place], [place] [phone]   Eligibility Requirements You must have lived in [place], [place], or [place] counties for at least the last three months.   You cannot be eligible for state or federal sponsored [employer], including [employer], [place], or [employer].   You generally cannot be eligible for healthcare insurance through your employer.     How to apply: Eligibility screenings are held every Tuesday and Wednesday afternoon from 1:00 pm until 4:00 pm. You do not need an appointment for the interview!  [facility] [address] [place], [place]   [place] Department  [place]  [phone]    Behavioral Health Resources in the Community: Intensive Outpatient Programs Organization         Address  Phone  Notes  [place] [address] [place], [place] [phone]   [facility]patient [address] [place], [place]   ADS: Alcohol & Drug Svcs [address] [place], [place]   [place] [address],  [place], [place] or [phone]   Substance Abuse Resources Organization         Address  Phone  Notes  Alcohol and Drug Services  [phone]   [place]  [phone]   [facility] [place]   Chinita Pester  [phone]   Residential & Outpatient Substance Abuse Program  [phone]   Psychological Services Organization         Address  Phone  Notes  [facility] [place]  [place]  [phone]   [place] [address] [place] or [phone]    Mobile Crisis Teams Organization         Address  Phone  Notes  [facility], Mobile Crisis Care Unit  [phone]   [facility]  [address] [place], [place]   [place] [address], [place] [phone]    Self-Help/Support Groups Organization         Address  Phone             Notes  [place]. of [place] - variety of support groups  [place] Call for more information  Narcotics Anonymous (NA), [facility] [address], [employer] [place]  2 meetings at this location   Special educational needs teacher  Address  Phone  Notes  [facility] [address],    [place]  [phone]   [facility]  [address], [place] [address], [place], [place] [phone]   [facility] [address] [place], [place] [phone] Admissions: 8am-3pm M-F  [facility] [address]    [place], [place] [phone]   [facility] [address] [place], [place], [place] [phone]   [facility] [address]  [place], [place] [phone]   [facility] - Intensive Outpatient [address], [place], [place] [phone]   [facility] ([facility] [address]  [place], [place] [phone] or [phone]   [facility] Services (RTS) [address], [place], [place] [phone] Accepts Medicaid  [facility] [place] [address]  [place] [phone] Substance Abuse/Addiction Treatment   [facility] Organization         Address  Phone  Notes  [facility]  [phone]   Angie Fava, PhD [address] Ervin Knack [place], [place]   [phone] or [phone]   [facility]   [address] [place], [place] [phone]   [facility] [address] [place], [place] [phone] Insurance/Medicaid/sponsorship through [facility] and Families [address]                                    [place], [place] [phone] Therapy/tele-psych/case  [facility] [address], [place] [phone]    Dr. Lolly Mustache  [phone]   [facility]  [employer] [facility] Dept. 1) [address] [place] 2) [address] [place] 3)  [address], [place] [phone]  [phone]   [facility] Child Abuse  Hotline 321-138-2904 or [phone] (After Hours)      It is important for you to follow-up with dentistry for definitive care of your dental pain. Please take all of your antibiotics as prescribed. He may take your pain medicine as needed for severe pain. Continue to take your Motrin for mild discomfort. Please take your blood pressure medicine as prescribed and follow-up with your primary care for further evaluation and management of your blood pressure.

## 2014-01-16 NOTE — ED Provider Notes (Signed)
CSN: [id number]     Arrival date & time 01/16/14  0907 History   First MD Initiated Contact with Patient 01/16/14 [phone]     Chief Complaint  Patient presents with  . Dental Pain     (Consider location/radiation/quality/duration/timing/severity/associated sxs/prior Treatment) HPI Edward Cabrera is a 54 y.o. male who comes in for evaluation of tooth pain. Patient states for the past month or so he has had right lower tooth pain. Reports he is going to the dentist next week. He characterizes the pain as a dull ache. Pain is worsened with chewing and cold temperatures. Ibuprofen will relieve the discomfort temporarily. Denies fevers, difficulties breathing, difficulty swallowing, sore throat, drooling, recent illnesses.  Past Medical History  Diagnosis Date  . Hypertension    History reviewed. No pertinent past surgical history. No family history on file. History  Substance Use Topics  . Smoking status: Never Smoker   . Smokeless tobacco: Not on file  . Alcohol Use: Yes    Review of Systems  Constitutional: Negative for fever.  HENT: Negative for drooling.   Gastrointestinal: Negative for abdominal pain.  All other systems reviewed and are negative.     Allergies  Iohexol  Home Medications   Prior to Admission medications   Medication Sig Start Date End Date Taking? Authorizing Provider  ibuprofen (ADVIL,MOTRIN) 200 MG tablet Take 800 mg by mouth every 6 (six) hours as needed. PAIN   Yes Historical Provider, MD  Omega-3 Fatty Acids (OMEGA 3 PO) Take 1 capsule by mouth daily.   Yes Historical Provider, MD  amLODipine (NORVASC) 10 MG tablet Take 1 tablet (10 mg total) by mouth once. 01/16/14   Sharlene MottsBenjamin W Brentlee Delage, PA-C  naproxen (NAPROSYN) 500 MG tablet Take 1 tablet (500 mg total) by mouth 2 (two) times daily with a meal. Patient not taking: Reported on 01/16/2014 10/24/12   Junius FinnerErin O'Malley, PA-C  oxyCODONE-acetaminophen (PERCOCET/ROXICET) 5-325 MG per tablet Take 2 tablets by mouth  every 4 (four) hours as needed for moderate pain or severe pain. 01/16/14   Earle GellBenjamin W Zaelynn Fuchs, PA-C  penicillin v potassium (VEETID) 500 MG tablet Take 1 tablet (500 mg total) by mouth 4 (four) times daily. 01/16/14 01/23/14  Earle GellBenjamin W Laqueena Hinchey, PA-C  traMADol (ULTRAM) 50 MG tablet Take 1 tablet (50 mg total) by mouth every 6 (six) hours as needed for pain. Patient not taking: Reported on 01/16/2014 10/24/12   Junius FinnerErin O'Malley, PA-C   BP 167/110 mmHg  Pulse 66  Temp(Src) 98.6 F (37 C) (Oral)  Resp 18  SpO2 99% Physical Exam  Constitutional:  Awake, alert, nontoxic appearance.  HENT:  Head: Atraumatic.  Mouth/Throat: Oropharynx is clear and moist. No oropharyngeal exudate.  Tenderness to right lower posterior molar, with significant erosion. Overall poor dentition. Multiple missing teeth with active caries. Mucous membranes are moist. No unilateral tonsillar swelling, uvula midline, no glossal swelling or elevation. No trismus. No fluctuance or evidence of a drainable abscess. No other evidence of emergent infection, Ludwig or Vincents angina. Tolerating secretions well. Patent airway   Eyes: Conjunctivae are normal. Right eye exhibits no discharge. Left eye exhibits no discharge.  Neck: Normal range of motion. Neck supple.  Cardiovascular: Normal rate, regular rhythm, normal heart sounds and intact distal pulses.   Pulmonary/Chest: Effort normal and breath sounds normal. No respiratory distress. He has no wheezes. He has no rales. He exhibits no tenderness.  Abdominal: Soft. He exhibits no distension. There is no tenderness. There is no rebound.  Musculoskeletal:  Normal range of motion. He exhibits no edema or tenderness.  Baseline ROM, no obvious new focal weakness.  Lymphadenopathy:    He has no cervical adenopathy.  Neurological:  Mental status and motor strength appears baseline for patient and situation.  Skin: No rash noted.  Psychiatric: He has a normal mood and affect.  Nursing  note and vitals reviewed.   ED Course  Procedures (including critical care time) Labs Review Labs Reviewed - No data to display  Imaging Review No results found.   EKG Interpretation None     NERVE BLOCK Performed by: Sharlene Motts Consent: Verbal consent obtained. Required items: required blood products, implants, devices, and special equipment available Time out: Immediately prior to procedure a "time out" was called to verify the correct patient, procedure, equipment, support staff and site/side marked as required.  Indication: Dental pain  Nerve block body site: Inferior alveolar, right   Preparation: Patient was prepped and draped in the usual sterile fashion. Needle gauge: 27 G Location technique: anatomical landmarks  Local anesthetic: Bupivacaine   Anesthetic total: 2 ml  Outcome: pain improved Patient tolerance: Patient tolerated the procedure well with no immediate complications.  Meds given in ED:  Medications  cloNIDine (CATAPRES) tablet 0.2 mg (0.2 mg Oral Given 01/16/14 1125)  bupivacaine-epinephrine (MARCAINE W/ EPI) 0.5% -1:200000 injection 1.8 mL (1.8 mLs Infiltration Given 01/16/14 1016)  amLODipine (NORVASC) tablet 10 mg (10 mg Oral Given 01/16/14 1052)    New Prescriptions   AMLODIPINE (NORVASC) 10 MG TABLET    Take 1 tablet (10 mg total) by mouth once.   OXYCODONE-ACETAMINOPHEN (PERCOCET/ROXICET) 5-325 MG PER TABLET    Take 2 tablets by mouth every 4 (four) hours as needed for moderate pain or severe pain.   PENICILLIN V POTASSIUM (VEETID) 500 MG TABLET    Take 1 tablet (500 mg total) by mouth 4 (four) times daily.   Filed Vitals:   01/16/14 0937 01/16/14 1052 01/16/14 1055 01/16/14 1125  BP: 187/117 185/123 192/131 167/110  Pulse:   66   Temp:      TempSrc:      Resp:      SpO2:   99%     MDM  Patient with hypertension--improving in ED, states he has been off blood pressure medications for the past year. Vitals otherwise stable.  Given amlodipine 10 mg in the ED, we'll discharge with same and instructed to follow-up with primary care. Resource guide given. Pt resting comfortably in ED. pain much improved after dental block PE--patient has poor dentition but there is no evidence of Ludwig or vincents angina. No drainable abscess. Otherwise normal exam, no evidence of end organ damage.  Patient reports that he will follow-up with dentistry next week. Referral given for other dentistry options as well as resource guide. Discharged with pain medicines, antibiotics and blood pressure medicine. Instructions to follow up with and establish care with primary.  I discussed all relevant lab findings and imaging results with pt and they verbalized understanding. Discussed f/u with PCP within 48 hrs and return precautions, pt very amenable to plan.  Prior to patient discharge, I discussed and reviewed this case with Dr.Zavitz    Final diagnoses:  Pain, dental  Essential hypertension        Sharlene Motts, PA-C 01/16/14 1148  Enid Skeens, MD 01/19/14 1014

## 2014-03-04 ENCOUNTER — Encounter (HOSPITAL_COMMUNITY): Payer: Self-pay | Admitting: Emergency Medicine

## 2014-03-04 ENCOUNTER — Emergency Department (HOSPITAL_COMMUNITY)
Admission: EM | Admit: 2014-03-04 | Discharge: 2014-03-04 | Disposition: A | Discharge status: Home / Self Care | Payer: 59 | Attending: Emergency Medicine | Admitting: Emergency Medicine

## 2014-03-04 ENCOUNTER — Emergency Department (HOSPITAL_COMMUNITY): Payer: 59

## 2014-03-04 DIAGNOSIS — I10 Essential (primary) hypertension: Secondary | ICD-10-CM | POA: Insufficient documentation

## 2014-03-04 DIAGNOSIS — R0602 Shortness of breath: Secondary | ICD-10-CM | POA: Insufficient documentation

## 2014-03-04 DIAGNOSIS — M546 Pain in thoracic spine: Secondary | ICD-10-CM | POA: Diagnosis present

## 2014-03-04 DIAGNOSIS — Z86711 Personal history of pulmonary embolism: Secondary | ICD-10-CM | POA: Diagnosis not present

## 2014-03-04 DIAGNOSIS — Z79899 Other long term (current) drug therapy: Secondary | ICD-10-CM | POA: Insufficient documentation

## 2014-03-04 DIAGNOSIS — M5489 Other dorsalgia: Secondary | ICD-10-CM

## 2014-03-04 HISTORY — DX: Other pulmonary embolism without acute cor pulmonale: I26.99

## 2014-03-04 LAB — D-DIMER, QUANTITATIVE (NOT AT ARMC)

## 2014-03-04 MED ORDER — KETOROLAC TROMETHAMINE 60 MG/2ML IM SOLN
60.0000 mg | Freq: Once | INTRAMUSCULAR | Status: AC
  Administered 2014-03-04: 60 mg via INTRAMUSCULAR
  Filled 2014-03-04: qty 2

## 2014-03-04 MED ORDER — IBUPROFEN 600 MG PO TABS: 600.0000 mg | ORAL_TABLET | Freq: Four times a day (QID) | ORAL | Status: DC | PRN

## 2014-03-04 MED ORDER — METHOCARBAMOL 750 MG PO TABS: 750.0000 mg | ORAL_TABLET | Freq: Four times a day (QID) | ORAL | Status: DC

## 2014-03-04 NOTE — Discharge Instructions (Signed)

## 2014-03-04 NOTE — ED Notes (Signed)
Pt states has had back pain for last 3 days. Pt denies shortness of breath. Pt states pain in back is more painful with movement. 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 Pt states has been off coumadin since 2011.
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
## 2014-03-04 NOTE — ED Notes (Signed)
Pt denied questions/concerns, a&o x 4 ambulatory at dc

## 2015-05-04 ENCOUNTER — Encounter: Payer: Self-pay | Admitting: Gastroenterology

## 2015-06-14 ENCOUNTER — Telehealth: Payer: Self-pay

## 2015-06-14 NOTE — Telephone Encounter (Signed)
[facility] and avoid procedure [facility]-28-15 being canceled.

## 2015-06-28 ENCOUNTER — Encounter: Payer: Self-pay | Admitting: Gastroenterology

## 2015-11-29 ENCOUNTER — Telehealth: Payer: Self-pay | Admitting: *Deleted

## 2015-11-29 ENCOUNTER — Emergency Department (HOSPITAL_COMMUNITY)
Admission: EM | Admit: 2015-11-29 | Discharge: 2015-11-29 | Disposition: A | Discharge status: Home / Self Care | Payer: BLUE CROSS/BLUE SHIELD | Attending: Emergency Medicine | Admitting: Emergency Medicine

## 2015-11-29 ENCOUNTER — Encounter (HOSPITAL_COMMUNITY): Payer: Self-pay | Admitting: *Deleted

## 2015-11-29 DIAGNOSIS — Z79899 Other long term (current) drug therapy: Secondary | ICD-10-CM | POA: Diagnosis not present

## 2015-11-29 DIAGNOSIS — Y999 Unspecified external cause status: Secondary | ICD-10-CM | POA: Diagnosis not present

## 2015-11-29 DIAGNOSIS — Z760 Encounter for issue of repeat prescription: Secondary | ICD-10-CM | POA: Diagnosis not present

## 2015-11-29 DIAGNOSIS — Y939 Activity, unspecified: Secondary | ICD-10-CM | POA: Insufficient documentation

## 2015-11-29 DIAGNOSIS — I10 Essential (primary) hypertension: Secondary | ICD-10-CM | POA: Diagnosis not present

## 2015-11-29 DIAGNOSIS — Y92410 Unspecified street and highway as the place of occurrence of the external cause: Secondary | ICD-10-CM | POA: Insufficient documentation

## 2015-11-29 DIAGNOSIS — M542 Cervicalgia: Secondary | ICD-10-CM | POA: Insufficient documentation

## 2015-11-29 MED ORDER — LISINOPRIL-HYDROCHLOROTHIAZIDE 10-12.5 MG PO TABS: 1.0000 | ORAL_TABLET | Freq: Every day | ORAL | 0 refills | Status: DC

## 2015-11-29 MED ORDER — METHOCARBAMOL 500 MG PO TABS: 500.0000 mg | ORAL_TABLET | Freq: Three times a day (TID) | ORAL | 0 refills | Status: DC | PRN

## 2015-11-29 MED ORDER — AMLODIPINE BESYLATE 10 MG PO TABS: 10.0000 mg | ORAL_TABLET | Freq: Once | ORAL | 0 refills | Status: DC

## 2015-11-29 MED ORDER — IBUPROFEN 600 MG PO TABS: 600.0000 mg | ORAL_TABLET | Freq: Four times a day (QID) | ORAL | 0 refills | Status: DC | PRN

## 2015-11-29 NOTE — ED Notes (Signed)
Bed: WTR8 Expected date:  Expected time:  Means of arrival:  Comments: 

## 2015-11-29 NOTE — ED Notes (Signed)
Pt verbalized understanding of discharge instructions and denies any further questions at this time.   

## 2015-11-29 NOTE — Telephone Encounter (Signed)
Pharmacy called to verify directions for Norvasc.  EDCM ([facility]) notified EDCM ([facility]) to clarify as EDP was still there.
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
## 2017-02-06 ENCOUNTER — Encounter: Payer: Self-pay | Admitting: Specialist

## 2017-03-12 ENCOUNTER — Encounter (HOSPITAL_COMMUNITY): Payer: Self-pay | Admitting: Emergency Medicine

## 2017-03-12 ENCOUNTER — Emergency Department (HOSPITAL_COMMUNITY)
Admission: EM | Admit: 2017-03-12 | Discharge: 2017-03-12 | Disposition: A | Discharge status: Home / Self Care | Payer: BLUE CROSS/BLUE SHIELD | Attending: Emergency Medicine | Admitting: Emergency Medicine

## 2017-03-12 DIAGNOSIS — I10 Essential (primary) hypertension: Secondary | ICD-10-CM | POA: Insufficient documentation

## 2017-03-12 DIAGNOSIS — R1011 Right upper quadrant pain: Secondary | ICD-10-CM | POA: Insufficient documentation

## 2017-03-12 DIAGNOSIS — Z79899 Other long term (current) drug therapy: Secondary | ICD-10-CM | POA: Insufficient documentation

## 2017-03-12 LAB — CBC
HCT: 38.9 % — ABNORMAL LOW (ref 39.0–52.0)
Hemoglobin: 13.1 g/dL (ref 13.0–17.0)
MCH: 27.8 pg (ref 26.0–34.0)
MCHC: 33.7 g/dL (ref 30.0–36.0)
MCV: 82.6 fL (ref 78.0–100.0)
PLATELETS: 222 10*3/uL (ref 150–400)
RBC: 4.71 MIL/uL (ref 4.22–5.81)
RDW: 13.5 % (ref 11.5–15.5)
WBC: 7.2 10*3/uL (ref 4.0–10.5)

## 2017-03-12 LAB — COMPREHENSIVE METABOLIC PANEL
ALK PHOS: 64 U/L (ref 38–126)
ALT: 26 U/L (ref 17–63)
AST: 17 U/L (ref 15–41)
Albumin: 3.3 g/dL — ABNORMAL LOW (ref 3.5–5.0)
Anion gap: 6 (ref 5–15)
BUN: 14 mg/dL (ref 6–20)
CHLORIDE: 104 mmol/L (ref 101–111)
CO2: 27 mmol/L (ref 22–32)
CREATININE: 1.22 mg/dL (ref 0.61–1.24)
Calcium: 8.8 mg/dL — ABNORMAL LOW (ref 8.9–10.3)
GFR calc Af Amer: >60 mL/min (ref >60)
Glucose, Bld: 225 mg/dL — ABNORMAL HIGH (ref 65–99)
Potassium: 3.6 mmol/L (ref 3.5–5.1)
SODIUM: 137 mmol/L (ref 135–145)
Total Bilirubin: 0.6 mg/dL (ref 0.3–1.2)
Total Protein: 6.8 g/dL (ref 6.5–8.1)

## 2017-03-12 LAB — LIPASE, BLOOD: LIPASE: 26 U/L (ref 11–51)

## 2017-03-12 LAB — URINALYSIS, ROUTINE W REFLEX MICROSCOPIC
Bacteria, UA: NONE SEEN
Bilirubin Urine: NEGATIVE
Glucose, UA: 50 mg/dL — AB
Hgb urine dipstick: NEGATIVE
KETONES UR: NEGATIVE mg/dL
Leukocytes, UA: NEGATIVE
Nitrite: NEGATIVE
PH: 5 (ref 5.0–8.0)
Protein, ur: 30 mg/dL — AB
RBC / HPF: NONE SEEN RBC/hpf (ref 0–5)
SQUAMOUS EPITHELIAL / LPF: NONE SEEN
Specific Gravity, Urine: 1.015 (ref 1.005–1.030)

## 2017-03-12 MED ORDER — ROSUVASTATIN CALCIUM 10 MG PO TABS: 10.0000 mg | ORAL_TABLET | Freq: Every day | ORAL | 0 refills | Status: DC

## 2017-03-12 NOTE — ED Notes (Signed)
Patient states he had a PE in 2007.

## 2017-03-12 NOTE — ED Triage Notes (Signed)
Pt c/o right side pain for couple days that is now constant. Pt denies n/v/d or urinary problems. Reports that has more constipation now.

## 2017-03-12 NOTE — ED Notes (Signed)
LAST INGESTION OF "POT 4 MONTHS AGO". INGESTED 800 MG MOTRIN DAILY FOR THE PAST MONTH.

## 2017-03-12 NOTE — ED Provider Notes (Signed)
[facility]-EMERGENCY DEPT Provider Note   CSN: [id number] Arrival date & time: 03/12/17  0843     History   Chief Complaint Chief Complaint  Patient presents with  . Abdominal Pain    HPI Edward Cabrera is a 57 y.o. male.  HPI Patient presents with concern of pain in the right infracostal area. Onset  was a subtle, yesterday, since onset the pain is been persistent, with worsening pain, described as sharp in the same area, with no radiation, no associated chest pain, other abdominal pain, dysuria, hematuria, nausea, vomiting, dyspnea. Patient has history of hypertension, prior blood clot, is not on anticoagulant, states that he was well prior to the onset of symptoms. Patient does take ibuprofen for other complaints, notes no change in dosing after this pain began, and there are no clear alleviating or exacerbating factors. Past Medical History:  Diagnosis Date  . Hypertension   . Pulmonary embolism (HCC)     There are no active problems to display for this patient.   Past Surgical History:  Procedure Laterality Date  . ROTATOR CUFF REPAIR Left        Home Medications    Prior to Admission medications   Medication Sig Start Date End Date Taking? Authorizing Provider  amLODipine (NORVASC) 10 MG tablet Take 1 tablet (10 mg total) by mouth once. 11/29/15 03/12/17 Yes Fayrene Helper, PA-C  ibuprofen (ADVIL,MOTRIN) 800 MG tablet Take 800 mg by mouth daily as needed for moderate pain.   Yes [provider]  lisinopril-hydrochlorothiazide (PRINZIDE,ZESTORETIC) 10-12.5 MG tablet Take 1 tablet by mouth daily. 11/29/15  Yes Fayrene Helper, PA-C  Omega-3 Fatty Acids (OMEGA 3 PO) Take 1 capsule by mouth daily.   Yes [provider]  ibuprofen (ADVIL,MOTRIN) 600 MG tablet Take 1 tablet (600 mg total) by mouth every 6 (six) hours as needed. Patient not taking: Reported on 03/12/2017 11/29/15   Fayrene Helper, PA-C  methocarbamol (ROBAXIN) 500 MG tablet  Take 1 tablet (500 mg total) by mouth every 8 (eight) hours as needed for muscle spasms. Patient not taking: Reported on 03/12/2017 11/29/15   Fayrene Helper, PA-C    Family History No family history on file.  Social History Social History   Tobacco Use  . Smoking status: Never Smoker  . Smokeless tobacco: Never Used  Substance Use Topics  . Alcohol use: Yes  . Drug use: No     Allergies   Iohexol   Review of Systems Review of Systems  Constitutional:       Per HPI, otherwise negative  HENT:       Per HPI, otherwise negative  Respiratory:       Per HPI, otherwise negative  Cardiovascular:       Per HPI, otherwise negative  Gastrointestinal: Negative for vomiting.  Endocrine:       Negative aside from HPI  Genitourinary:       Neg aside from HPI   Musculoskeletal:       Per HPI, otherwise negative  Skin: Negative.   Neurological: Negative for syncope.     Physical Exam Updated Vital Signs BP 130/78   Pulse 73   Temp 98.6 F (37 C) (Oral)   Resp 18   Ht 5\' 8"  (1.727 m)   Wt 100.2 kg (221 lb)   SpO2 97%   BMI 33.60 kg/m   Physical Exam  Constitutional: He is oriented to person, place, and time. He appears well-developed. No distress.  HENT:  Head: Normocephalic and atraumatic.  Eyes: Conjunctivae and EOM are normal.  Cardiovascular: Normal rate and regular rhythm.  Pulmonary/Chest: Effort normal. No stridor. No respiratory distress.  Abdominal: He exhibits no distension. There is tenderness.  Tenderness in the right inferior axilla, without crepitus, without deformity  Musculoskeletal: He exhibits no edema.  Neurological: He is alert and oriented to person, place, and time.  Skin: Skin is warm and dry.  Psychiatric: He has a normal mood and affect.  Nursing note and vitals reviewed.    ED Treatments / Results  Labs (all labs ordered are listed, but only abnormal results are displayed) Labs Reviewed  COMPREHENSIVE METABOLIC PANEL - Abnormal;  Notable for the following components:      Result Value   Glucose, Bld 225 (*)    Calcium 8.8 (*)    Albumin 3.3 (*)    All other components within normal limits  CBC - Abnormal; Notable for the following components:   HCT 38.9 (*)    All other components within normal limits  URINALYSIS, ROUTINE W REFLEX MICROSCOPIC - Abnormal; Notable for the following components:   Glucose, UA 50 (*)    Protein, ur 30 (*)    All other components within normal limits  LIPASE, BLOOD    EKG  EKG Interpretation None       Radiology No results found.  Procedures Procedures (including critical care time)  Medications Ordered in ED Medications - No data to display   Initial Impression / Assessment and Plan / ED Course  I have reviewed the triage vital signs and the nursing notes.  Pertinent labs & imaging results that were available during my care of the patient were reviewed by me and considered in my medical decision making (see chart for details).     12:43 PM On repeat exam the patient is awake and alert, no new complaints, appears calm, in no distress, we discussed all findings at length, including reassuring urinalysis, no evidence for stone or infection, reassuring labs, no evidence for hepatobiliary dysfunction. Given the patient's generally reassuring physical exam, some suspicion for either musculoskeletal etiology or possibly a passed kidney stone. Lower suspicion for colonic pathology, the patient is already scheduling a colonoscopy, and was encouraged to ensure that this occurs soon. Given his reassuring findings, absence of distress, reassuring physical exam, patient was discharged with close outpatient follow-up Per request the patient was provided refill of his cholesterol medication.   Final Clinical Impressions(s) / ED Diagnoses  Abdominal pain, right upper quadrant   Gerhard MunchLockwood, Toye Rouillard, MD 03/12/17 1244

## 2017-03-12 NOTE — Discharge Instructions (Signed)
As discussed, your evaluation today has been largely reassuring.  But, it is important that you monitor your condition carefully, and do not hesitate to return to the ED if you develop new, or concerning changes in your condition.  Otherwise, please follow-up with your physician for appropriate ongoing care. Please be sure to schedule your colonoscopy as well.

## 2017-06-13 ENCOUNTER — Encounter (HOSPITAL_COMMUNITY): Payer: Self-pay | Admitting: Emergency Medicine

## 2017-06-13 ENCOUNTER — Emergency Department (HOSPITAL_COMMUNITY)
Admission: EM | Admit: 2017-06-13 | Discharge: 2017-06-13 | Disposition: A | Discharge status: Home / Self Care | Payer: BLUE CROSS/BLUE SHIELD | Attending: Emergency Medicine | Admitting: Emergency Medicine

## 2017-06-13 DIAGNOSIS — M25571 Pain in right ankle and joints of right foot: Secondary | ICD-10-CM | POA: Diagnosis present

## 2017-06-13 DIAGNOSIS — Z79899 Other long term (current) drug therapy: Secondary | ICD-10-CM | POA: Insufficient documentation

## 2017-06-13 DIAGNOSIS — M10071 Idiopathic gout, right ankle and foot: Secondary | ICD-10-CM | POA: Diagnosis not present

## 2017-06-13 DIAGNOSIS — I10 Essential (primary) hypertension: Secondary | ICD-10-CM | POA: Diagnosis not present

## 2017-06-13 MED ORDER — COLCHICINE 0.6 MG PO TABS
1.2000 mg | ORAL_TABLET | Freq: Once | ORAL | Status: AC
  Administered 2017-06-13: 1.2 mg via ORAL
  Filled 2017-06-13: qty 2

## 2017-06-13 MED ORDER — INDOMETHACIN 50 MG PO CAPS: 50.0000 mg | ORAL_CAPSULE | Freq: Three times a day (TID) | ORAL | 0 refills | Status: AC

## 2017-06-13 MED ORDER — IBUPROFEN 800 MG PO TABS: 800.0000 mg | ORAL_TABLET | Freq: Once | ORAL | Status: DC

## 2017-06-13 MED ORDER — COLCHICINE 0.6 MG PO TABS: 0.6000 mg | ORAL_TABLET | Freq: Once | ORAL | Status: DC

## 2017-06-13 MED ORDER — COLCHICINE 0.6 MG PO TABS
0.6000 mg | ORAL_TABLET | Freq: Once | ORAL | Status: DC
  Filled 2017-06-13: qty 1

## 2017-06-13 MED ORDER — INDOMETHACIN 25 MG PO CAPS: 50.0000 mg | ORAL_CAPSULE | Freq: Once | ORAL | Status: DC

## 2017-06-13 NOTE — Discharge Instructions (Addendum)
Follow-up with your PCP to discuss possible maintenance therapy to prevent future gout episodes.  Be mindful of your diet and try to cut back/eliminate foods that you know cause your gout to flare.

## 2017-06-13 NOTE — ED Provider Notes (Addendum)
[facility]-EMERGENCY DEPT Provider Note  CSN: [id number] Arrival date & time: 06/13/17  0930    History   Chief Complaint Chief Complaint  Patient presents with  . Gout    HPI Edward Cabrera is a 57 y.o. male with a medical history of gout HTN and PE who presented to the ED for right ankle pain x1 day. Patient endorses swelling, redness and warmth of affected joint. Denies recent trauma, injury or accidents. Admits to increased alcohol intake over the last couple of weeks which he thinks precipitated this flare.Patient endorses past gout attacks which have usually affected his right great toe and that are relieved over days with NSAIDs. Currently not on maintenance or preventive therapy for gout. Patient has tried nothing prior to coming to the ED.  Past Medical History:  Diagnosis Date  . Hypertension   . Pulmonary embolism (HCC)     There are no active problems to display for this patient.   Past Surgical History:  Procedure Laterality Date  . ROTATOR CUFF REPAIR Left         Home Medications    Prior to Admission medications   Medication Sig Start Date End Date Taking? Authorizing Provider  amLODipine (NORVASC) 10 MG tablet Take 1 tablet (10 mg total) by mouth once. 11/29/15 03/12/17  Fayrene Helperran, [place], PA-C  ibuprofen (ADVIL,MOTRIN) 600 MG tablet Take 1 tablet (600 mg total) by mouth every 6 (six) hours as needed. Patient not taking: Reported on 03/12/2017 11/29/15   Fayrene Helperran, [place], PA-C  ibuprofen (ADVIL,MOTRIN) 800 MG tablet Take 800 mg by mouth daily as needed for moderate pain.    [provider]  indomethacin (INDOCIN) 50 MG capsule Take 1 capsule (50 mg total) by mouth 3 (three) times daily for 14 days. 06/13/17 06/27/17  Jestine Bicknell, Jerrel IvoryGabrielle I, PA-C  lisinopril-hydrochlorothiazide (PRINZIDE,ZESTORETIC) 10-12.5 MG tablet Take 1 tablet by mouth daily. 11/29/15   Fayrene Helperran, [place], PA-C  methocarbamol (ROBAXIN) 500 MG tablet Take 1 tablet (500 mg total) by  mouth every 8 (eight) hours as needed for muscle spasms. Patient not taking: Reported on 03/12/2017 11/29/15   Fayrene Helperran, [place], PA-C  Omega-3 Fatty Acids (OMEGA 3 PO) Take 1 capsule by mouth daily.    [provider]  rosuvastatin (CRESTOR) 10 MG tablet Take 1 tablet (10 mg total) by mouth daily. 03/12/17   Gerhard MunchLockwood, Robert, MD    Family History No family history on file.  Social History Social History   Tobacco Use  . Smoking status: Never Smoker  . Smokeless tobacco: Never Used  Substance Use Topics  . Alcohol use: Yes  . Drug use: No     Allergies   Iohexol   Review of Systems Review of Systems  Constitutional: Negative for activity change, chills and fever.  Endocrine: Negative.   Musculoskeletal: Positive for arthralgias, gait problem and joint swelling. Negative for myalgias.  Skin: Negative for color change and rash.  Neurological: Negative for weakness and numbness.     Physical Exam Updated Vital Signs BP (!) 148/88   Pulse 78   Temp 98.3 F (36.8 C) (Oral)   Resp 18   SpO2 99%   Physical Exam  Constitutional: He appears well-developed and well-nourished. No distress.  Cardiovascular:  Pulses:      Dorsalis pedis pulses are 2+ on the right side, and 2+ on the left side.       Posterior tibial pulses are 2+ on the right side, and 2+ on the left side.  Musculoskeletal:       Right knee: Normal.       Left knee: Normal.       Right ankle: He exhibits decreased range of motion and swelling. He exhibits no ecchymosis and no deformity. No tenderness.       Left ankle: Normal.  Feet:  Right Foot:  Skin Integrity: Positive for warmth.  Neurological: He displays normal reflexes. No sensory deficit. He exhibits normal muscle tone. Coordination normal.  Decreased strength in right ankle, but neurovascular intact.   Skin: Skin is warm and dry. Capillary refill takes less than 2 seconds.  Nursing note and vitals reviewed.    ED Treatments / Results    Labs (all labs ordered are listed, but only abnormal results are displayed) Labs Reviewed - No data to display  EKG None  Radiology No results found.  Procedures Procedures (including critical care time)  Medications Ordered in ED Medications  colchicine tablet 0.6 mg (has no administration in time range)  colchicine tablet 1.2 mg (1.2 mg Oral Given 06/13/17 1151)     Initial Impression / Assessment and Plan / ED Course  Triage vital signs and the nursing notes have been reviewed.  Pertinent labs & imaging results that were available during care of the patient were reviewed and considered in medical decision making (see chart for details). Clinical Course as of Jun 14 1219  Wed Jun 13, 2017  1131 Colchicine 1.2mg  x1 given in theED for acute flare.   [GM]    Clinical Course User Index [GM] Alizae Bechtel, Sharyon Medicus, PA-C   Patient presents in no acute distress. Right ankle joint is swollen, tender and warm; however, he is still able to ambulate and bear weight. No bony tenderness that would warrant imaging. He denies systemic s/s that would suggest an infectious etiology. Pt's PCP office does not utilize Epic system, but pt states he has been seen by them in the past for gout flares. Treated acute gout flare with colchicine in the ED since patient is within 24 hour window for that and will follow with NSAIDs after discharge.   Final Clinical Impressions(s) / ED Diagnoses  1. Gout. Colchicine 1.2mg  x1 given in the ED with instructions to take 0.6mg  1 hour later. Indocin 50mg  TID prescribed. Education provided on gout and appropriate diet recommendations. Advised to follow-up with PCP to discuss maintenance or preventative therapy.  Dispo: Home. After thorough clinical evaluation, this patient is determined to be medically stable and can be safely discharged with the previously mentioned treatment and/or outpatient follow-up/referral(s). At this time, there are no other apparent medical  conditions that require further screening, evaluation or treatment.  Final diagnoses:  Acute idiopathic gout of right ankle    ED Discharge Orders        Ordered    indomethacin (INDOCIN) 50 MG capsule  3 times daily     06/13/17 1146        Daysie Helf, [place] I, PA-C 06/13/17 1225    [address] [place] I, PA-C 06/13/17 1226    Derwood Kaplan, MD 06/13/17 1643

## 2017-06-13 NOTE — ED Triage Notes (Signed)
Pt c/o gout in right ankle that started this morning.

## 2018-01-21 ENCOUNTER — Other Ambulatory Visit: Payer: Self-pay

## 2018-01-21 ENCOUNTER — Encounter (HOSPITAL_COMMUNITY): Payer: Self-pay

## 2018-01-21 ENCOUNTER — Emergency Department (HOSPITAL_COMMUNITY)
Admission: EM | Admit: 2018-01-21 | Discharge: 2018-01-21 | Disposition: A | Discharge status: Home / Self Care | Payer: BLUE CROSS/BLUE SHIELD | Attending: Emergency Medicine | Admitting: Emergency Medicine

## 2018-01-21 DIAGNOSIS — M10071 Idiopathic gout, right ankle and foot: Secondary | ICD-10-CM | POA: Diagnosis not present

## 2018-01-21 DIAGNOSIS — Z79899 Other long term (current) drug therapy: Secondary | ICD-10-CM | POA: Insufficient documentation

## 2018-01-21 DIAGNOSIS — M109 Gout, unspecified: Secondary | ICD-10-CM

## 2018-01-21 DIAGNOSIS — M25571 Pain in right ankle and joints of right foot: Secondary | ICD-10-CM | POA: Diagnosis present

## 2018-01-21 MED ORDER — OXYCODONE-ACETAMINOPHEN 5-325 MG PO TABS
1.0000 | ORAL_TABLET | Freq: Once | ORAL | Status: AC
  Administered 2018-01-21: 1 via ORAL
  Filled 2018-01-21: qty 1

## 2018-01-21 MED ORDER — PREDNISONE 20 MG PO TABS: 40.0000 mg | ORAL_TABLET | Freq: Every day | ORAL | 0 refills | Status: DC

## 2018-01-21 MED ORDER — HYDROCODONE-ACETAMINOPHEN 5-325 MG PO TABS: 1.0000 | ORAL_TABLET | Freq: Four times a day (QID) | ORAL | 0 refills | Status: DC | PRN

## 2018-01-21 MED ORDER — COLCHICINE 0.6 MG PO TABS
1.2000 mg | ORAL_TABLET | Freq: Once | ORAL | Status: AC
  Administered 2018-01-21: 1.2 mg via ORAL
  Filled 2018-01-21: qty 2

## 2018-01-21 MED ORDER — PREDNISONE 20 MG PO TABS
40.0000 mg | ORAL_TABLET | Freq: Once | ORAL | Status: AC
  Administered 2018-01-21: 40 mg via ORAL
  Filled 2018-01-21: qty 2

## 2018-01-21 MED ORDER — KETOROLAC TROMETHAMINE 15 MG/ML IJ SOLN
15.0000 mg | Freq: Once | INTRAMUSCULAR | Status: AC
  Administered 2018-01-21: 15 mg via INTRAMUSCULAR
  Filled 2018-01-21: qty 1

## 2018-01-21 NOTE — ED Triage Notes (Signed)
Pt c/o right foot and ankle swelling and pain x 1 month. Hx of gout.

## 2018-01-28 NOTE — ED Provider Notes (Signed)
[facility]-EMERGENCY DEPT Provider Note   CSN: [id number] Arrival date & time: 01/21/18  0759     History   Chief Complaint Chief Complaint  Patient presents with  . Foot Pain    HPI Edward Cabrera is a 58 y.o. male.  HPI   58 year old male with right foot and ankle pain.  Onset about a month ago.  Progressively worse in the past several days.  Past history of gout.  Denies any acute trauma.  No fevers or chills.  No unusual numbness or tingling.  Past Medical History:  Diagnosis Date  . Hypertension   . Pulmonary embolism (HCC)     There are no active problems to display for this patient.   Past Surgical History:  Procedure Laterality Date  . ROTATOR CUFF REPAIR Left         Home Medications    Prior to Admission medications   Medication Sig Start Date End Date Taking? Authorizing Provider  acetaminophen (TYLENOL) 325 MG tablet Take 650 mg by mouth every 6 (six) hours as needed for mild pain.   Yes [provider]  amLODipine (NORVASC) 10 MG tablet Take 10 mg by mouth daily. 11/07/17  Yes [provider]  atorvastatin (LIPITOR) 10 MG tablet Take 10 mg by mouth daily. 12/27/17  Yes [provider]  lisinopril-hydrochlorothiazide (PRINZIDE,ZESTORETIC) 10-12.5 MG tablet Take 1 tablet by mouth daily. 11/29/15  Yes Fayrene Helper, PA-C  amLODipine (NORVASC) 10 MG tablet Take 1 tablet (10 mg total) by mouth once. 11/29/15 03/12/17  Fayrene Helper, PA-C  HYDROcodone-acetaminophen (NORCO/VICODIN) 5-325 MG tablet Take 1 tablet by mouth every 6 (six) hours as needed. 01/21/18   Raeford Razor, MD  ibuprofen (ADVIL,MOTRIN) 600 MG tablet Take 1 tablet (600 mg total) by mouth every 6 (six) hours as needed. Patient not taking: Reported on 03/12/2017 11/29/15   Fayrene Helper, PA-C  methocarbamol (ROBAXIN) 500 MG tablet Take 1 tablet (500 mg total) by mouth every 8 (eight) hours as needed for muscle spasms. Patient not taking: Reported on  03/12/2017 11/29/15   Fayrene Helper, PA-C  predniSONE (DELTASONE) 20 MG tablet Take 2 tablets (40 mg total) by mouth daily. 01/21/18   Raeford Razor, MD  rosuvastatin (CRESTOR) 10 MG tablet Take 1 tablet (10 mg total) by mouth daily. Patient not taking: Reported on 01/21/2018 03/12/17   Gerhard Munch, MD    Family History History reviewed. No pertinent family history.  Social History Social History   Tobacco Use  . Smoking status: Never Smoker  . Smokeless tobacco: Never Used  Substance Use Topics  . Alcohol use: Yes  . Drug use: No     Allergies   Iohexol   Review of Systems Review of Systems All systems reviewed and negative, other than as noted in HPI.   Physical Exam Updated Vital Signs BP (!) 175/97   Pulse 79   Temp 98.1 F (36.7 C) (Oral)   Resp 16   Ht 5\' 8"  (1.727 m)   Wt 105.2 kg   SpO2 98%   BMI 35.28 kg/m   Physical Exam Vitals signs and nursing note reviewed.  Constitutional:      General: He is not in acute distress.    Appearance: He is well-developed.  HENT:     Head: Normocephalic and atraumatic.  Eyes:     General:        Right eye: No discharge.        Left eye: No discharge.  Conjunctiva/sclera: Conjunctivae normal.  Neck:     Musculoskeletal: Neck supple.  Cardiovascular:     Rate and Rhythm: Normal rate and regular rhythm.     Heart sounds: Normal heart sounds. No murmur. No friction rub. No gallop.   Pulmonary:     Effort: Pulmonary effort is normal. No respiratory distress.     Breath sounds: Normal breath sounds.  Abdominal:     General: There is no distension.     Palpations: Abdomen is soft.     Tenderness: There is no abdominal tenderness.  Musculoskeletal:        General: Swelling and tenderness present.     Comments: Right foot/ankle mild swelling.  Faint erythema over the dorsum of the foot extending to around the area of the first MT joint.  Severe pain with attempted range of motion of the first MTP joint.  No  abscess.  Sensation intact light touch.  Palpable DP pulse.  Skin:    General: Skin is warm and dry.  Neurological:     Mental Status: He is alert.  Psychiatric:        Behavior: Behavior normal.        Thought Content: Thought content normal.      ED Treatments / Results  Labs (all labs ordered are listed, but only abnormal results are displayed) Labs Reviewed - No data to display  EKG None  Radiology No results found.  Procedures Procedures (including critical care time)  Medications Ordered in ED Medications  colchicine tablet 1.2 mg (1.2 mg Oral Given 01/21/18 1014)  ketorolac (TORADOL) 15 MG/ML injection 15 mg (15 mg Intramuscular Given 01/21/18 1014)  predniSONE (DELTASONE) tablet 40 mg (40 mg Oral Given 01/21/18 1014)  oxyCODONE-acetaminophen (PERCOCET/ROXICET) 5-325 MG per tablet 1 tablet (1 tablet Oral Given 01/21/18 1014)     Initial Impression / Assessment and Plan / ED Course  I have reviewed the triage vital signs and the nursing notes.  Pertinent labs & imaging results that were available during my care of the patient were reviewed by me and considered in my medical decision making (see chart for details).     58 year old male with history and exam consistent with gout.  Will treat as such.  No signs of acute vascular insufficiency.  Doubt infectious process. Final Clinical Impressions(s) / ED Diagnoses   Final diagnoses:  Gout of right foot, unspecified cause, unspecified chronicity    ED Discharge Orders         Ordered    predniSONE (DELTASONE) 20 MG tablet  Daily     01/21/18 0949    HYDROcodone-acetaminophen (NORCO/VICODIN) 5-325 MG tablet  Every 6 hours PRN     01/21/18 0949           Raeford Razor, MD 01/28/18 1031

## 2018-03-02 ENCOUNTER — Encounter (HOSPITAL_COMMUNITY): Payer: Self-pay | Admitting: *Deleted

## 2018-03-02 ENCOUNTER — Other Ambulatory Visit: Payer: Self-pay

## 2018-03-02 ENCOUNTER — Emergency Department (HOSPITAL_COMMUNITY)
Admission: EM | Admit: 2018-03-02 | Discharge: 2018-03-02 | Disposition: A | Discharge status: Home / Self Care | Payer: BLUE CROSS/BLUE SHIELD | Attending: Emergency Medicine | Admitting: Emergency Medicine

## 2018-03-02 DIAGNOSIS — M7918 Myalgia, other site: Secondary | ICD-10-CM | POA: Insufficient documentation

## 2018-03-02 DIAGNOSIS — J029 Acute pharyngitis, unspecified: Secondary | ICD-10-CM | POA: Insufficient documentation

## 2018-03-02 DIAGNOSIS — I10 Essential (primary) hypertension: Secondary | ICD-10-CM | POA: Diagnosis not present

## 2018-03-02 DIAGNOSIS — R05 Cough: Secondary | ICD-10-CM | POA: Diagnosis not present

## 2018-03-02 DIAGNOSIS — Z79899 Other long term (current) drug therapy: Secondary | ICD-10-CM | POA: Diagnosis not present

## 2018-03-02 DIAGNOSIS — R6889 Other general symptoms and signs: Secondary | ICD-10-CM

## 2018-03-02 DIAGNOSIS — R509 Fever, unspecified: Secondary | ICD-10-CM | POA: Diagnosis not present

## 2018-03-02 MED ORDER — BENZONATATE 100 MG PO CAPS: 100.0000 mg | ORAL_CAPSULE | Freq: Three times a day (TID) | ORAL | 0 refills | Status: DC | PRN

## 2018-03-02 MED ORDER — ACETAMINOPHEN 325 MG PO TABS
650.0000 mg | ORAL_TABLET | Freq: Once | ORAL | Status: AC
  Administered 2018-03-02: 650 mg via ORAL
  Filled 2018-03-02: qty 2

## 2018-03-02 NOTE — ED Triage Notes (Signed)
Pt complains of generalized body aches, productive cough since yesterday. Pt states he has been around people who have been sick at work.

## 2018-03-02 NOTE — ED Provider Notes (Signed)
[facility]-EMERGENCY DEPT Provider Note   CSN: [id number] Arrival date & time: 03/02/18  1121    History   Chief Complaint Chief Complaint  Patient presents with  . Generalized Body Aches  . Cough    HPI Edward Cabrera is a 58 y.o. male with a history of hypertension presenting today to the emergency department with chief complaint of flulike symptoms x1 day.  Patient reports when he woke up this morning he had generalized body aches, nonproductive cough, sore throat.   He describes the cough as mild and gradual onset.  It has been intermittent and remains unchanged since onset.  He does not smoke.  He describes his sore throat as constant, feeling scratchy when he swallows.  He did not take anything for his symptoms prior to arrival.  Patient admits to multiple sick contacts at work.  He reports several coworkers have the flu.  Patient did not get influenza vaccine this year.  Patient did not check his temperature prior to arrival but states he felt warm. Patient denies associated chest pain, shortness of breath, nausea, vomiting, diarrhea.       Past Medical History:  Diagnosis Date  . Hypertension   . Pulmonary embolism (HCC)     There are no active problems to display for this patient.   Past Surgical History:  Procedure Laterality Date  . ROTATOR CUFF REPAIR Left         Home Medications    Prior to Admission medications   Medication Sig Start Date End Date Taking? Authorizing Provider  acetaminophen (TYLENOL) 325 MG tablet Take 650 mg by mouth every 6 (six) hours as needed for mild pain.    [provider]  amLODipine (NORVASC) 10 MG tablet Take 1 tablet (10 mg total) by mouth once. 11/29/15 03/12/17  Fayrene Helper, PA-C  amLODipine (NORVASC) 10 MG tablet Take 10 mg by mouth daily. 11/07/17   [provider]  atorvastatin (LIPITOR) 10 MG tablet Take 10 mg by mouth daily. 12/27/17   [provider]  benzonatate  (TESSALON) 100 MG capsule Take 1 capsule (100 mg total) by mouth 3 (three) times daily as needed for cough. 03/02/18   Albrizze, Kaitlyn E, PA-C  HYDROcodone-acetaminophen (NORCO/VICODIN) 5-325 MG tablet Take 1 tablet by mouth every 6 (six) hours as needed. 01/21/18   Raeford Razor, MD  ibuprofen (ADVIL,MOTRIN) 600 MG tablet Take 1 tablet (600 mg total) by mouth every 6 (six) hours as needed. Patient not taking: Reported on 03/12/2017 11/29/15   Fayrene Helper, PA-C  lisinopril-hydrochlorothiazide (PRINZIDE,ZESTORETIC) 10-12.5 MG tablet Take 1 tablet by mouth daily. 11/29/15   Fayrene Helper, PA-C  methocarbamol (ROBAXIN) 500 MG tablet Take 1 tablet (500 mg total) by mouth every 8 (eight) hours as needed for muscle spasms. Patient not taking: Reported on 03/12/2017 11/29/15   Fayrene Helper, PA-C  predniSONE (DELTASONE) 20 MG tablet Take 2 tablets (40 mg total) by mouth daily. 01/21/18   Raeford Razor, MD  rosuvastatin (CRESTOR) 10 MG tablet Take 1 tablet (10 mg total) by mouth daily. Patient not taking: Reported on 01/21/2018 03/12/17   Gerhard Munch, MD    Family History No family history on file.  Social History Social History   Tobacco Use  . Smoking status: Never Smoker  . Smokeless tobacco: Never Used  Substance Use Topics  . Alcohol use: Yes  . Drug use: No     Allergies   Iohexol   Review of Systems Review of Systems  Constitutional: Positive for fever. Negative for chills and diaphoresis.  HENT: Positive for sore throat. Negative for congestion, ear pain and trouble swallowing.   Eyes: Negative for pain and visual disturbance.  Respiratory: Positive for cough. Negative for shortness of breath.   Cardiovascular: Negative for chest pain and palpitations.  Gastrointestinal: Negative for abdominal pain, diarrhea and vomiting.  Genitourinary: Negative for dysuria and hematuria.  Musculoskeletal: Positive for myalgias. Negative for arthralgias and back pain.  Skin: Negative for color  change and rash.  Neurological: Negative for seizures and syncope.  All other systems reviewed and are negative.    Physical Exam Updated Vital Signs BP (!) 150/94 (BP Location: Right Arm)   Pulse 88   Temp 99 F (37.2 C) (Oral)   Resp 20   SpO2 96%   Physical Exam Vitals signs and nursing note reviewed.  Constitutional:      Appearance: He is well-developed. He is not toxic-appearing.  HENT:     Head: Normocephalic and atraumatic.     Comments: Sinuses nontender to palpation.    Right Ear: Tympanic membrane and external ear normal.     Left Ear: Tympanic membrane and external ear normal.     Nose: Nose normal.     Mouth/Throat:     Mouth: Mucous membranes are moist.     Pharynx: Oropharynx is clear.     Comments: Minor erythema to oropharynx, no edema, no exudate, no tonsillar swelling, voice normal, neck supple without lymphadenopathy  Eyes:     General: No scleral icterus.       Right eye: No discharge.        Left eye: No discharge.     Conjunctiva/sclera: Conjunctivae normal.     Pupils: Pupils are equal, round, and reactive to light.  Neck:     Musculoskeletal: Normal range of motion. No muscular tenderness.  Cardiovascular:     Rate and Edward: Normal rate and regular Edward.     Pulses: Normal pulses.     Heart sounds: Normal heart sounds.  Pulmonary:     Effort: Pulmonary effort is normal.     Breath sounds: Normal breath sounds.  Abdominal:     General: There is no distension.     Palpations: Abdomen is soft.  Musculoskeletal: Normal range of motion.  Lymphadenopathy:     Cervical: No cervical adenopathy.  Skin:    General: Skin is warm and dry.  Neurological:     Mental Status: He is oriented to person, place, and time.  Psychiatric:        Behavior: Behavior normal.      ED Treatments / Results  Labs (all labs ordered are listed, but only abnormal results are displayed) Labs Reviewed - No data to display  EKG None  Radiology No results  found.  Procedures Procedures (including critical care time)  Medications Ordered in ED Medications  acetaminophen (TYLENOL) tablet 650 mg (650 mg Oral Given 03/02/18 1225)     Initial Impression / Assessment and Plan / ED Course  I have reviewed the triage vital signs and the nursing notes.  Pertinent labs & imaging results that were available during my care of the patient were reviewed by me and considered in my medical decision making (see chart for details).    Patient with symptoms consistent with influenza.  Vitals are stable, low-grade fever.  No signs of dehydration, tolerating PO's.  Lungs are clear. Due to patient's presentation and physical exam a chest x-ray was not  ordered bc likely diagnosis of flu.  Patient given Tylenol for his fever and body aches given that he has not taken anything prior to arrival.  Discussed the risk versus benefit of Tamiflu treatment with the patient.  The patient understands that symptoms are in than the recommended 24-48 hour window of treatment however he declines Tamiflu.  We discussed at length over-the-counter symptomatic treatment.  Patient will be discharged with instructions to orally hydrate, rest, and use over-the-counter medications Tylenol for fever and muscle aches. Patient will also be given a cough suppressant.  Patient has a primary care doctor that he plans to follow-up with by the end of the week.      Final Clinical Impressions(s) / ED Diagnoses   Final diagnoses:  Flu-like symptoms    ED Discharge Orders         Ordered    benzonatate (TESSALON) 100 MG capsule  3 times daily PRN     03/02/18 1227           Sherene Sires, PA-C 03/02/18 1308    Benjiman Core, MD 03/02/18 [phone]

## 2018-03-02 NOTE — Discharge Instructions (Addendum)
You're symptoms are consistent with the flu andthis is a viral infection that will likely start to improve after 5-7 days, antibiotics are not helpful in treating viral infections.  You will benefit from symptomatic treatment.    Please make sure you are drinking plenty of fluids.  Prescription has been sent to your pharmacy for [facility].  This is a cough medicine, please take as directed.   You can treat your symptoms supportively with tylenol/ for fevers and pains, and Flonase to heal with nasal congestion, and throat lozenges to help with cough. If your symptoms are not improving please follow up with you Primary doctor.   If you develop persistent fevers, shortness of breath or difficulty breathing, chest pain, severe headache and neck pain, persistent nausea and vomiting or other new or concerning symptoms return to the Emergency department.

## 2019-04-10 ENCOUNTER — Encounter (HOSPITAL_COMMUNITY): Payer: Self-pay

## 2019-04-10 ENCOUNTER — Ambulatory Visit (HOSPITAL_COMMUNITY)
Admission: EM | Admit: 2019-04-10 | Discharge: 2019-04-10 | Disposition: A | Discharge status: Home / Self Care | Payer: 59 | Attending: Family Medicine | Admitting: Family Medicine

## 2019-04-10 ENCOUNTER — Other Ambulatory Visit: Payer: Self-pay

## 2019-04-10 DIAGNOSIS — I10 Essential (primary) hypertension: Secondary | ICD-10-CM | POA: Diagnosis not present

## 2019-04-10 DIAGNOSIS — Z760 Encounter for issue of repeat prescription: Secondary | ICD-10-CM

## 2019-04-10 MED ORDER — AMLODIPINE BESYLATE 10 MG PO TABS: 10.0000 mg | ORAL_TABLET | Freq: Every day | ORAL | 1 refills | Status: DC

## 2019-04-10 MED ORDER — LISINOPRIL-HYDROCHLOROTHIAZIDE 10-12.5 MG PO TABS: 1.0000 | ORAL_TABLET | Freq: Every day | ORAL | 1 refills | Status: DC

## 2019-04-10 NOTE — Discharge Instructions (Signed)
Take the BP medicine every day Return as needed

## 2019-04-10 NOTE — ED Triage Notes (Signed)
Pt reports his insurance recently changed, has appointment with new PCP in a month, but needs refill on lisinopril-hctz 10/12.5mg  tab and amlodipine 10mg  tab.

## 2019-04-10 NOTE — ED Provider Notes (Signed)
[facility]    CSN: [id number] Arrival date & time: 04/10/19  1001      History   Chief Complaint Chief Complaint  Patient presents with  . Medication Refill    HPI Edward Cabrera is a 59 y.o. male.   HPI  Patient is here for medication refill He is on lisinopril and amlodipine, has not had this medicine since yesterday Blood pressure today is reasonably well controlled at 137/102 He has no headache, dizzy spell.  No history of heart disease.  No chest pain or shortness of breath He previously took lisinopril 10/HCTZ 12.5 and Amlodipine 10. His chart indicates that he also used to take lipid-lowering medications.  He states he no longer takes these.  I told him when he sees his new PCP that he needs to discuss this with them He starts with a new primary care doctor next month  Past Medical History:  Diagnosis Date  . Hypertension   . Pulmonary embolism (HCC)     There are no problems to display for this patient.   Past Surgical History:  Procedure Laterality Date  . ROTATOR CUFF REPAIR Left        Home Medications    Prior to Admission medications   Medication Sig Start Date End Date Taking? Authorizing Provider  acetaminophen (TYLENOL) 325 MG tablet Take 650 mg by mouth every 6 (six) hours as needed for mild pain.    [provider]  amLODipine (NORVASC) 10 MG tablet Take 1 tablet (10 mg total) by mouth daily. 04/10/19   Eustace Moore, MD  atorvastatin (LIPITOR) 10 MG tablet Take 10 mg by mouth daily. 12/27/17   [provider]  benzonatate (TESSALON) 100 MG capsule Take 1 capsule (100 mg total) by mouth 3 (three) times daily as needed for cough. 03/02/18   Albrizze, Kaitlyn E, PA-C  HYDROcodone-acetaminophen (NORCO/VICODIN) 5-325 MG tablet Take 1 tablet by mouth every 6 (six) hours as needed. 01/21/18   Raeford Razor, MD  lisinopril-hydrochlorothiazide (ZESTORETIC) 10-12.5 MG tablet Take 1 tablet by mouth daily. 04/10/19   Eustace Moore, MD  rosuvastatin (CRESTOR) 10 MG tablet Take 1 tablet (10 mg total) by mouth daily. Patient not taking: Reported on 01/21/2018 03/12/17 04/10/19  Gerhard Munch, MD    Family History No family history on file.  Social History Social History   Tobacco Use  . Smoking status: Never Smoker  . Smokeless tobacco: Never Used  Substance Use Topics  . Alcohol use: Yes  . Drug use: No     Allergies   Iohexol   Review of Systems Review of Systems  Constitutional:       No complaints     Physical Exam Triage Vital Signs ED Triage Vitals  Enc Vitals Group     BP 04/10/19 1016 (!) 137/102     Pulse Rate 04/10/19 1016 87     Resp --      Temp 04/10/19 1016 98.5 F (36.9 C)     Temp Source 04/10/19 1016 Oral     SpO2 04/10/19 1016 97 %     Weight --      Height --      Head Circumference --      Peak Flow --      Pain Score 04/10/19 1013 0     Pain Loc --      Pain Edu? --      Excl. in [place]? --    No data  found.  Updated Vital Signs BP (!) 137/102 (BP Location: Right Arm)   Pulse 87   Temp 98.5 F (36.9 C) (Oral)   SpO2 97%      Physical Exam Constitutional:      General: He is not in acute distress.    Appearance: He is well-developed.  HENT:     Head: Normocephalic and atraumatic.  Eyes:     Conjunctiva/sclera: Conjunctivae normal.     Pupils: Pupils are equal, round, and reactive to light.  Cardiovascular:     Rate and Rhythm: Normal rate and regular rhythm.     Heart sounds: Normal heart sounds.  Pulmonary:     Effort: Pulmonary effort is normal. No respiratory distress.     Breath sounds: Normal breath sounds.  Abdominal:     General: There is no distension.     Palpations: Abdomen is soft.  Musculoskeletal:        General: Normal range of motion.     Cervical back: Normal range of motion.  Skin:    General: Skin is warm and dry.  Neurological:     Mental Status: He is alert.  Psychiatric:        Mood and Affect: Mood normal.         Behavior: Behavior normal.      UC Treatments / Results  Labs (all labs ordered are listed, but only abnormal results are displayed) Labs Reviewed - No data to display  EKG   Radiology No results found.  Procedures Procedures (including critical care time)  Medications Ordered in UC Medications - No data to display  Initial Impression / Assessment and Plan / UC Course  I have reviewed the triage vital signs and the nursing notes.  Pertinent labs & imaging results that were available during my care of the patient were reviewed by me and considered in my medical decision making (see chart for details).      Final Clinical Impressions(s) / UC Diagnoses   Final diagnoses:  Essential hypertension  Encounter for medication refill     Discharge Instructions     Take the BP medicine every day Return as needed   ED Prescriptions    Medication Sig Dispense Auth. Provider   amLODipine (NORVASC) 10 MG tablet Take 1 tablet (10 mg total) by mouth daily. 30 tablet Raylene Everts, MD   lisinopril-hydrochlorothiazide (ZESTORETIC) 10-12.5 MG tablet Take 1 tablet by mouth daily. 30 tablet Raylene Everts, MD     PDMP not reviewed this encounter.   Raylene Everts, MD 04/10/19 [phone]

## 2019-04-14 ENCOUNTER — Ambulatory Visit (HOSPITAL_COMMUNITY)
Admission: EM | Admit: 2019-04-14 | Discharge: 2019-04-14 | Disposition: A | Discharge status: Home / Self Care | Payer: 59 | Attending: Family Medicine | Admitting: Family Medicine

## 2019-04-14 ENCOUNTER — Other Ambulatory Visit: Payer: Self-pay

## 2019-04-14 ENCOUNTER — Encounter (HOSPITAL_COMMUNITY): Payer: Self-pay

## 2019-04-14 DIAGNOSIS — M109 Gout, unspecified: Secondary | ICD-10-CM

## 2019-04-14 MED ORDER — PREDNISONE 10 MG (21) PO TBPK: ORAL_TABLET | ORAL | 0 refills | Status: DC

## 2019-04-14 NOTE — Discharge Instructions (Addendum)
Treating you for gout Take the prednisone as prescribed Recommend getting callus pad for the bottom of your foot. Rest, ice and elevate Follow up as needed for continued or worsening symptoms

## 2019-04-14 NOTE — ED Provider Notes (Signed)
[facility]    CSN: [id number] Arrival date & time: 04/14/19  7619      History   Chief Complaint Chief Complaint  Patient presents with  . Toe Pain    HPI Edward Cabrera is a 59 y.o. male.   Patient is a 59 year old male past medical history of hypertension and PE.  He presents today with approximately 2 weeks or more of left great toe pain.  Symptoms have been constant, waxing waning.  He was initially taking 800 ibuprofen with minimal relief.  He has had left great toe swelling, erythema and limit range of motion.  Reports that he has not been eating much red meat or drinking any alcohol.  No injuries to the foot, numbness or tingling.  He also has hardened area to bottom of foot.  Painful with walking.  Reports that he walks long hours at work on concrete floors but wears supportive shoes and insoles. No fever, open wounds or drainage. No hx of diabetes.   ROS per HPI        Past Medical History:  Diagnosis Date  . Hypertension   . Pulmonary embolism (HCC)     There are no problems to display for this patient.   Past Surgical History:  Procedure Laterality Date  . ROTATOR CUFF REPAIR Left        Home Medications    Prior to Admission medications   Medication Sig Start Date End Date Taking? Authorizing Provider  acetaminophen (TYLENOL) 325 MG tablet Take 650 mg by mouth every 6 (six) hours as needed for mild pain.    [provider]  amLODipine (NORVASC) 10 MG tablet Take 1 tablet (10 mg total) by mouth daily. 04/10/19   Eustace Moore, MD  atorvastatin (LIPITOR) 10 MG tablet Take 10 mg by mouth daily. 12/27/17   [provider]  benzonatate (TESSALON) 100 MG capsule Take 1 capsule (100 mg total) by mouth 3 (three) times daily as needed for cough. 03/02/18   Albrizze, Kaitlyn E, PA-C  HYDROcodone-acetaminophen (NORCO/VICODIN) 5-325 MG tablet Take 1 tablet by mouth every 6 (six) hours as needed. 01/21/18   Raeford Razor, MD    lisinopril-hydrochlorothiazide (ZESTORETIC) 10-12.5 MG tablet Take 1 tablet by mouth daily. 04/10/19   Eustace Moore, MD  predniSONE (STERAPRED UNI-PAK 21 TAB) 10 MG (21) TBPK tablet 6 tabs for 1 day, then 5 tabs for 1 das, then 4 tabs for 1 day, then 3 tabs for 1 day, 2 tabs for 1 day, then 1 tab for 1 day 04/14/19   Dahlia Byes A, NP  rosuvastatin (CRESTOR) 10 MG tablet Take 1 tablet (10 mg total) by mouth daily. Patient not taking: Reported on 01/21/2018 03/12/17 04/10/19  Gerhard Munch, MD    Family History No family history on file.  Social History Social History   Tobacco Use  . Smoking status: Never Smoker  . Smokeless tobacco: Never Used  Substance Use Topics  . Alcohol use: Yes  . Drug use: No     Allergies   Iohexol   Review of Systems Review of Systems   Physical Exam Triage Vital Signs ED Triage Vitals  Enc Vitals Group     BP 04/14/19 0849 (!) 152/98     Pulse Rate 04/14/19 0849 80     Resp 04/14/19 0849 16     Temp 04/14/19 0849 98.3 F (36.8 C)     Temp Source 04/14/19 0849 Oral     SpO2 04/14/19  0849 97 %     Weight --      Height --      Head Circumference --      Peak Flow --      Pain Score 04/14/19 0850 10     Pain Loc --      Pain Edu? --      Excl. in [place]? --    No data found.  Updated Vital Signs BP (!) 152/98 (BP Location: Left Arm)   Pulse 80   Temp 98.3 F (36.8 C) (Oral)   Resp 16   SpO2 97%   Visual Acuity Right Eye Distance:   Left Eye Distance:   Bilateral Distance:    Right Eye Near:   Left Eye Near:    Bilateral Near:     Physical Exam Vitals and nursing note reviewed.  Constitutional:      Appearance: Normal appearance.  HENT:     Head: Normocephalic and atraumatic.     Nose: Nose normal.  Eyes:     Conjunctiva/sclera: Conjunctivae normal.  Pulmonary:     Effort: Pulmonary effort is normal.  Musculoskeletal:        General: Normal range of motion.     Cervical back: Normal range of motion.      Comments: Left great toe pain and swelling with mild erythema Pain mostly located to joint space.  Small, round hardened area .  To plantar aspect of foot   Skin:    General: Skin is warm and dry.  Neurological:     Mental Status: He is alert.  Psychiatric:        Mood and Affect: Mood normal.      UC Treatments / Results  Labs (all labs ordered are listed, but only abnormal results are displayed) Labs Reviewed - No data to display  EKG   Radiology No results found.  Procedures Procedures (including critical care time)  Medications Ordered in UC Medications - No data to display  Initial Impression / Assessment and Plan / UC Course  I have reviewed the triage vital signs and the nursing notes.  Pertinent labs & imaging results that were available during my care of the patient were reviewed by me and considered in my medical decision making (see chart for details).     Gout-treating with prednisone taper over the next 6 days. Recommended rest and work note given.   Final Clinical Impressions(s) / UC Diagnoses   Final diagnoses:  Acute gout involving toe of left foot, unspecified cause     Discharge Instructions     Treating you for gout Take the prednisone as prescribed Recommend getting callus pad for the bottom of your foot. Rest, ice and elevate Follow up as needed for continued or worsening symptoms        ED Prescriptions    Medication Sig Dispense Auth. Provider   predniSONE (STERAPRED UNI-PAK 21 TAB) 10 MG (21) TBPK tablet 6 tabs for 1 day, then 5 tabs for 1 das, then 4 tabs for 1 day, then 3 tabs for 1 day, 2 tabs for 1 day, then 1 tab for 1 day 21 tablet Zhane Donlan A, NP     PDMP not reviewed this encounter.   Orvan July, NP 04/14/19 [phone]

## 2019-04-14 NOTE — ED Triage Notes (Signed)
C/o left great toe pain. Pt has hx gout. Reports this feels similar.

## 2019-04-21 ENCOUNTER — Other Ambulatory Visit: Payer: Self-pay

## 2019-04-21 ENCOUNTER — Ambulatory Visit (INDEPENDENT_AMBULATORY_CARE_PROVIDER_SITE_OTHER): Payer: 59 | Admitting: Primary Care

## 2019-04-21 ENCOUNTER — Encounter (INDEPENDENT_AMBULATORY_CARE_PROVIDER_SITE_OTHER): Payer: Self-pay | Admitting: Primary Care

## 2019-04-21 VITALS — BP 118/82 | HR 91 | Temp 97.2°F | Ht 67.72 in | Wt 228.0 lb

## 2019-04-21 DIAGNOSIS — E6609 Other obesity due to excess calories: Secondary | ICD-10-CM

## 2019-04-21 DIAGNOSIS — Z6834 Body mass index (BMI) 34.0-34.9, adult: Secondary | ICD-10-CM

## 2019-04-21 DIAGNOSIS — I10 Essential (primary) hypertension: Secondary | ICD-10-CM | POA: Diagnosis not present

## 2019-04-21 DIAGNOSIS — Z125 Encounter for screening for malignant neoplasm of prostate: Secondary | ICD-10-CM

## 2019-04-21 DIAGNOSIS — Z7689 Persons encountering health services in other specified circumstances: Secondary | ICD-10-CM

## 2019-04-21 DIAGNOSIS — E119 Type 2 diabetes mellitus without complications: Secondary | ICD-10-CM | POA: Diagnosis not present

## 2019-04-21 DIAGNOSIS — Z131 Encounter for screening for diabetes mellitus: Secondary | ICD-10-CM | POA: Diagnosis not present

## 2019-04-21 LAB — POCT GLYCOSYLATED HEMOGLOBIN (HGB A1C): Hemoglobin A1C: 8.9 % — AB (ref 4.0–5.6)

## 2019-04-21 MED ORDER — BLOOD GLUC METER DISP-STRIPS DEVI: 1.0000 | Freq: Two times a day (BID) | 0 refills | Status: DC

## 2019-04-21 MED ORDER — JANUMET 50-1000 MG PO TABS: 1.0000 | ORAL_TABLET | Freq: Two times a day (BID) | ORAL | 1 refills | Status: DC

## 2019-04-21 MED ORDER — AMLODIPINE BESYLATE 10 MG PO TABS: 10.0000 mg | ORAL_TABLET | Freq: Every day | ORAL | 1 refills | Status: DC

## 2019-04-21 MED ORDER — GLIPIZIDE 10 MG PO TABS: 10.0000 mg | ORAL_TABLET | Freq: Two times a day (BID) | ORAL | 3 refills | Status: DC

## 2019-04-21 MED ORDER — BLOOD GLUCOSE METER KIT: PACK | 0 refills | Status: DC

## 2019-04-21 MED ORDER — LISINOPRIL-HYDROCHLOROTHIAZIDE 10-12.5 MG PO TABS: 1.0000 | ORAL_TABLET | Freq: Every day | ORAL | 1 refills | Status: DC

## 2019-04-21 NOTE — Patient Instructions (Signed)

## 2019-04-21 NOTE — Progress Notes (Signed)
acc  New Patient Office Visit  Subjective:  Patient ID: Edward Cabrera, male    DOB: 05/14/1960  Age: 59 y.o. MRN: [id number]  CC:  Chief Complaint  Patient presents with  . New Patient (Initial Visit)    HTN  . Referral    podiatry     HPI Edward Cabrera  presents for establishment of care. He is a AfricanAmerican  Obese male alert oriented, approximately dressed  Blood sugar screening A1C 8.9 new diagnosis Type 2 diabetes. His blood pressure is unremarkable. .Denies shortness of breath, headaches, chest pain or lower extremity edema, sudden onset, vision changes, unilateral weakness, dizziness, paresthesias  Past Medical History:  Diagnosis Date  . Hypertension   . Pulmonary embolism [facility])     Past Surgical History:  Procedure Laterality Date  . ROTATOR CUFF REPAIR Left     History reviewed. No pertinent family history.  Social History   Socioeconomic History  . Marital status: Legally Separated    Spouse name: Not on file  . Number of children: Not on file  . Years of education: Not on file  . Highest education level: Not on file  Occupational History  . Not on file  Tobacco Use  . Smoking status: Never Smoker  . Smokeless tobacco: Never Used  Substance and Sexual Activity  . Alcohol use: Yes  . Drug use: No  . Sexual activity: Not on file  Other Topics Concern  . Not on file  Social History Narrative  . Not on file   Social Determinants of Health   Financial Resource Strain:   . Difficulty of Paying Living Expenses:   Food Insecurity:   . Worried About Charity fundraiser in the Last Year:   . Arboriculturist in the Last Year:   Transportation Needs:   . Film/video editor (Medical):   Marland Kitchen Lack of Transportation (Non-Medical):   Physical Activity:   . Days of Exercise per Week:   . Minutes of Exercise per Session:   Stress:   . Feeling of Stress :   Social Connections:   . Frequency of Communication with Friends and Family:   . Frequency of  Social Gatherings with Friends and Family:   . Attends Religious Services:   . Active Member of Clubs or Organizations:   . Attends Archivist Meetings:   Marland Kitchen Marital Status:   Intimate Partner Violence:   . Fear of Current or Ex-Partner:   . Emotionally Abused:   Marland Kitchen Physically Abused:   . Sexually Abused:     ROS Review of Systems  HENT: Positive for dental problem.   Eyes: Positive for visual disturbance.  Endocrine: Positive for polydipsia, polyphagia and polyuria.  Neurological: Positive for numbness.  All other systems reviewed and are negative.   Objective:   Today's Vitals: BP 118/82 (BP Location: Right Arm, Patient Position: Sitting, Cuff Size: Large)   Pulse 91   Temp (!) 97.2 F (36.2 C) (Temporal)   Ht 5' 7.72" (1.72 m)   Wt 228 lb (103.4 kg)   SpO2 96%   BMI 34.96 kg/m   Physical Exam Vitals reviewed.  Constitutional:      Appearance: He is obese.  HENT:     Head: Normocephalic.     Right Ear: Tympanic membrane normal.     Left Ear: Tympanic membrane normal.  Eyes:     Extraocular Movements: Extraocular movements intact.     Pupils: Pupils are equal, round, and  reactive to light.  Cardiovascular:     Rate and Rhythm: Normal rate and regular rhythm.  Pulmonary:     Effort: Pulmonary effort is normal.     Breath sounds: Normal breath sounds.  Abdominal:     General: Bowel sounds are normal.  Musculoskeletal:        General: Normal range of motion.     Cervical back: Normal range of motion and neck supple.  Skin:    General: Skin is warm and dry.  Neurological:     Mental Status: He is alert and oriented to person, place, and time.  Psychiatric:        Mood and Affect: Mood normal.        Behavior: Behavior normal.        Thought Content: Thought content normal.        Judgment: Judgment normal.     Assessment & Plan:  Edward Cabrera was seen today for new patient (initial visit) and referral.  Diagnoses and all orders for this  visit:  Screening for diabetes mellitus -     HgB A1c  Type 2 diabetes mellitus without complication, without long-term current use of insulin ([place]) New Diagnosis  Your A1C is a measure of your sugar over the past 3 months and is not affected by what you have eaten over the past few days. Diabetes increases your chances of stroke and heart attack over 300 % and is the leading cause of blindness and kidney failure in the [place]. Please make sure you decrease bad carbs like white bread, white rice, potatoes, corn, soft drinks, pasta, cereals, refined sugars, sweet tea, dried fruits, and fruit juice. Good carbs are okay to eat in moderation like sweet potatoes, brown rice, whole grain pasta/bread, most fruit (except dried fruit) and you can eat as many veggies as you want.   Greater than 6.5 is considered diabetic. Between 6.4 and 5.7 is prediabetic If your A1C is less than 5.7 you are NOT diabetic.  Targets for Glucose Readings: Time of Check Target for patients WITHOUT Diabetes Target for DIABETICS  Before Meals Less than 100  less than 150  Two hours after meals Less than 200  Less than 250   -     CBC with Differential  Class 1 obesity due to excess calories without serious comorbidity with body mass index (BMI) of 34.0 to 34.9 in adult Obesity is 30-39 indicating an excess in caloric intake or underlining conditions. This may lead to other co-morbidities- examples type 2 diabetes, hyperlipidemia and cardiovascular disease. Lifestyle modifications of diet and exercise may reduce obesity.  -     Lipid Panel  Essential hypertension  Blood pressure goal me less than  Is met 130/80, low-sodium, DASH diet, medication compliance, 150 minutes of moderate intensity exercise per week. Discussed medication compliance, adverse effects. -     amLODipine (NORVASC) 10 MG tablet; Take 1 tablet (10 mg total) by mouth daily. -     CMP14+EGFR -     lisinopril-hydrochlorothiazide (ZESTORETIC)  10-12.5 MG tablet; Take 1 tablet by mouth daily.  Encounter to establish care Juluis Mire, NP-C will be your  (PCP) she is mastered prepared . She is skilled to diagnosed and treat illness. Also able to answer health concern as well as continuing care of varied medical conditions, not limited by cause, organ system, or diagnosis.   Outpatient Encounter Medications as of 04/21/2019  Medication Sig  . amLODipine (NORVASC) 10 MG tablet Take 1 tablet (  10 mg total) by mouth daily.  Marland Kitchen lisinopril-hydrochlorothiazide (ZESTORETIC) 10-12.5 MG tablet Take 1 tablet by mouth daily.  . [DISCONTINUED] amLODipine (NORVASC) 10 MG tablet Take 1 tablet (10 mg total) by mouth daily.  . [DISCONTINUED] lisinopril-hydrochlorothiazide (ZESTORETIC) 10-12.5 MG tablet Take 1 tablet by mouth daily.  Marland Kitchen acetaminophen (TYLENOL) 325 MG tablet Take 650 mg by mouth every 6 (six) hours as needed for mild pain.  Marland Kitchen atorvastatin (LIPITOR) 10 MG tablet Take 10 mg by mouth daily.  . Blood Gluc Meter Disp-Strips (BLOOD GLUCOSE METER DISPOSABLE) DEVI 1 kit by Does not apply route 2 (two) times daily.  . blood glucose meter kit and supplies Dispense based on patient and insurance preference. Use up to four times daily as directed. (FOR ICD-10 E10.9, E11.9).  Marland Kitchen glipiZIDE (GLUCOTROL) 10 MG tablet Take 1 tablet (10 mg total) by mouth 2 (two) times daily before a meal.  . sitaGLIPtin-metformin (JANUMET) 50-1000 MG tablet Take 1 tablet by mouth 2 (two) times daily with a meal.  . [DISCONTINUED] benzonatate (TESSALON) 100 MG capsule Take 1 capsule (100 mg total) by mouth 3 (three) times daily as needed for cough.  . [DISCONTINUED] HYDROcodone-acetaminophen (NORCO/VICODIN) 5-325 MG tablet Take 1 tablet by mouth every 6 (six) hours as needed.  . [DISCONTINUED] predniSONE (STERAPRED UNI-PAK 21 TAB) 10 MG (21) TBPK tablet 6 tabs for 1 day, then 5 tabs for 1 das, then 4 tabs for 1 day, then 3 tabs for 1 day, 2 tabs for 1 day, then 1 tab for 1 day   . [DISCONTINUED] rosuvastatin (CRESTOR) 10 MG tablet Take 1 tablet (10 mg total) by mouth daily. (Patient not taking: Reported on 01/21/2018)   No facility-administered encounter medications on file as of 04/21/2019.    Follow-up: Return in about 3 months (around 07/21/2019) for DM in person.   [name], NP

## 2019-04-22 ENCOUNTER — Other Ambulatory Visit (INDEPENDENT_AMBULATORY_CARE_PROVIDER_SITE_OTHER): Payer: Self-pay | Admitting: Primary Care

## 2019-04-22 LAB — PSA: Prostate Specific Ag, Serum: 0.6 ng/mL (ref 0.0–4.0)

## 2019-04-22 LAB — CBC WITH DIFFERENTIAL/PLATELET
Basophils Absolute: 0 10*3/uL (ref 0.0–0.2)
Basos: 0 %
EOS (ABSOLUTE): 0.1 10*3/uL (ref 0.0–0.4)
Eos: 1 %
Hematocrit: 42.4 % (ref 37.5–51.0)
Hemoglobin: 14.3 g/dL (ref 13.0–17.7)
Immature Grans (Abs): 0 10*3/uL (ref 0.0–0.1)
Immature Granulocytes: 1 %
Lymphocytes Absolute: 3.3 10*3/uL — ABNORMAL HIGH (ref 0.7–3.1)
Lymphs: 46 %
MCH: 27.6 pg (ref 26.6–33.0)
MCHC: 33.7 g/dL (ref 31.5–35.7)
MCV: 82 fL (ref 79–97)
Monocytes Absolute: 0.5 10*3/uL (ref 0.1–0.9)
Monocytes: 7 %
Neutrophils Absolute: 3.1 10*3/uL (ref 1.4–7.0)
Neutrophils: 45 %
Platelets: 267 10*3/uL (ref 150–450)
RBC: 5.19 x10E6/uL (ref 4.14–5.80)
RDW: 14.1 % (ref 11.6–15.4)
WBC: 7.1 10*3/uL (ref 3.4–10.8)

## 2019-04-22 LAB — CMP14+EGFR
ALT: 26 IU/L (ref 0–44)
AST: 16 IU/L (ref 0–40)
Albumin/Globulin Ratio: 2 (ref 1.2–2.2)
Albumin: 4.5 g/dL (ref 3.8–4.9)
Alkaline Phosphatase: 78 IU/L (ref 39–117)
BUN/Creatinine Ratio: 13 (ref 9–20)
BUN: 16 mg/dL (ref 6–24)
Bilirubin Total: 0.2 mg/dL (ref 0.0–1.2)
CO2: 25 mmol/L (ref 20–29)
Calcium: 9.2 mg/dL (ref 8.7–10.2)
Chloride: 103 mmol/L (ref 96–106)
Creatinine, Ser: 1.26 mg/dL (ref 0.76–1.27)
GFR calc Af Amer: 73 mL/min/{1.73_m2} (ref >59)
GFR calc non Af Amer: 63 mL/min/{1.73_m2} (ref >59)
Globulin, Total: 2.3 g/dL (ref 1.5–4.5)
Glucose: 144 mg/dL — ABNORMAL HIGH (ref 65–99)
Potassium: 4.1 mmol/L (ref 3.5–5.2)
Sodium: 141 mmol/L (ref 134–144)
Total Protein: 6.8 g/dL (ref 6.0–8.5)

## 2019-04-22 LAB — LIPID PANEL
Chol/HDL Ratio: 4.4 ratio (ref 0.0–5.0)
Cholesterol, Total: 175 mg/dL (ref 100–199)
HDL: 40 mg/dL (ref >39)
LDL Chol Calc (NIH): 108 mg/dL — ABNORMAL HIGH (ref 0–99)
Triglycerides: 153 mg/dL — ABNORMAL HIGH (ref 0–149)
VLDL Cholesterol Cal: 27 mg/dL (ref 5–40)

## 2019-04-22 MED ORDER — ROSUVASTATIN CALCIUM 10 MG PO TABS: 10.0000 mg | ORAL_TABLET | Freq: Every day | ORAL | 1 refills | Status: DC

## 2019-04-24 ENCOUNTER — Telehealth (INDEPENDENT_AMBULATORY_CARE_PROVIDER_SITE_OTHER): Payer: Self-pay

## 2019-04-24 ENCOUNTER — Other Ambulatory Visit: Payer: Self-pay

## 2019-04-24 ENCOUNTER — Encounter (HOSPITAL_COMMUNITY): Payer: Self-pay

## 2019-04-24 ENCOUNTER — Ambulatory Visit (HOSPITAL_COMMUNITY)
Admission: EM | Admit: 2019-04-24 | Discharge: 2019-04-24 | Disposition: A | Discharge status: Home / Self Care | Payer: 59 | Attending: Family Medicine | Admitting: Family Medicine

## 2019-04-24 DIAGNOSIS — M1A9XX0 Chronic gout, unspecified, without tophus (tophi): Secondary | ICD-10-CM | POA: Diagnosis not present

## 2019-04-24 HISTORY — DX: Gout, unspecified: M10.9

## 2019-04-24 MED ORDER — COLCHICINE 0.6 MG PO TABS: 0.6000 mg | ORAL_TABLET | Freq: Every day | ORAL | 0 refills | Status: DC

## 2019-04-24 MED ORDER — PREDNISONE 10 MG PO TABS
40.0000 mg | ORAL_TABLET | Freq: Every day | ORAL | 0 refills | Status: AC
Start: 2019-04-24 — End: 2019-04-29

## 2019-04-24 NOTE — ED Provider Notes (Signed)
[place]    CSN: [id number] Arrival date & time: 04/24/19  0815      History   Chief Complaint Chief Complaint  Patient presents with  . foot swollen    HPI Edward Cabrera is a 59 y.o. male.   Pt is a 59 year male presents today with recurrent gout.  He was seen here approximate 1 week ago and treated with prednisone taper which he reports resolved his symptoms.  Symptoms returned yesterday.  Erythema, pain and swelling to left great toe joint.  Tender to touch and pain with walking.  Reporting he walks 12 hours a day at his job.  He states he has been avoiding diet related causes of gout flares.  No injury to the foot, fever, chills.  ROS per HPI      Past Medical History:  Diagnosis Date  . Gout   . Hypertension   . Pulmonary embolism (HCC)     There are no problems to display for this patient.   Past Surgical History:  Procedure Laterality Date  . ROTATOR CUFF REPAIR Left        Home Medications    Prior to Admission medications   Medication Sig Start Date End Date Taking? Authorizing Provider  acetaminophen (TYLENOL) 325 MG tablet Take 650 mg by mouth every 6 (six) hours as needed for mild pain.    [provider]  amLODipine (NORVASC) 10 MG tablet Take 1 tablet (10 mg total) by mouth daily. 04/21/19   [name], NP  atorvastatin (LIPITOR) 10 MG tablet Take 10 mg by mouth daily. 12/27/17   [provider]  Blood Gluc Meter Disp-Strips (BLOOD GLUCOSE METER DISPOSABLE) DEVI 1 kit by Does not apply route 2 (two) times daily. 04/21/19   [name], NP  blood glucose meter kit and supplies Dispense based on patient and insurance preference. Use up to four times daily as directed. (FOR ICD-10 E10.9, E11.9). 04/21/19   [name], NP  colchicine 0.6 MG tablet Take 1 tablet (0.6 mg total) by mouth daily. Take 2 tablets as soon as flare starts and then 1 pill an hour later. 04/24/19   Loura Halt A, NP  glipiZIDE  (GLUCOTROL) 10 MG tablet Take 1 tablet (10 mg total) by mouth 2 (two) times daily before a meal. 04/21/19   [name], NP  lisinopril-hydrochlorothiazide (ZESTORETIC) 10-12.5 MG tablet Take 1 tablet by mouth daily. 04/21/19   [name], NP  predniSONE (DELTASONE) 10 MG tablet Take 4 tablets (40 mg total) by mouth daily for 5 days. 04/24/19 04/29/19  Loura Halt A, NP  rosuvastatin (CRESTOR) 10 MG tablet Take 1 tablet (10 mg total) by mouth daily. 04/22/19   [name], NP  sitaGLIPtin-metformin (JANUMET) 50-1000 MG tablet Take 1 tablet by mouth 2 (two) times daily with a meal. 04/21/19   [name], NP    Family History Family History  Problem Relation Age of Onset  . Healthy Mother     Social History Social History   Tobacco Use  . Smoking status: Never Smoker  . Smokeless tobacco: Never Used  Substance Use Topics  . Alcohol use: Not Currently  . Drug use: Yes    Types: Marijuana     Allergies   Iohexol   Review of Systems Review of Systems   Physical Exam Triage Vital Signs ED Triage Vitals  Enc Vitals Group     BP 04/24/19 0835 119/65  Pulse Rate 04/24/19 0835 100     Resp 04/24/19 0835 18     Temp 04/24/19 0835 98.7 F (37.1 C)     Temp Source 04/24/19 0835 Oral     SpO2 04/24/19 0835 99 %     Weight 04/24/19 0836 228 lb (103.4 kg)     Height 04/24/19 0836 '5\' 8"'$  (1.727 m)     Head Circumference --      Peak Flow --      Pain Score 04/24/19 0836 8     Pain Loc --      Pain Edu? --      Excl. in [place]? --    No data found.  Updated Vital Signs BP 119/65   Pulse 100   Temp 98.7 F (37.1 C) (Oral)   Resp 18   Ht '5\' 8"'$  (1.727 m)   Wt 228 lb (103.4 kg)   SpO2 99%   BMI 34.67 kg/m   Visual Acuity Right Eye Distance:   Left Eye Distance:   Bilateral Distance:    Right Eye Near:   Left Eye Near:    Bilateral Near:     Physical Exam Vitals and nursing note reviewed.  Constitutional:      Appearance: Normal  appearance.  HENT:     Head: Normocephalic and atraumatic.     Nose: Nose normal.  Eyes:     Conjunctiva/sclera: Conjunctivae normal.  Pulmonary:     Effort: Pulmonary effort is normal.  Musculoskeletal:        General: Normal range of motion.     Cervical back: Normal range of motion.     Comments: Swelling, erythema and tenderness to left great toe with decreased range of motion.   Skin:    General: Skin is warm and dry.  Neurological:     Mental Status: He is alert.  Psychiatric:        Mood and Affect: Mood normal.      UC Treatments / Results  Labs (all labs ordered are listed, but only abnormal results are displayed) Labs Reviewed - No data to display  EKG   Radiology No results found.  Procedures Procedures (including critical care time)  Medications Ordered in UC Medications - No data to display  Initial Impression / Assessment and Plan / UC Course  I have reviewed the triage vital signs and the nursing notes.  Pertinent labs & imaging results that were available during my care of the patient were reviewed by me and considered in my medical decision making (see chart for details).     Chronic gout Treating with prednisone daily for 5 days. Recommend watching his sugar intake and drink plenty of water due to his diabetes. Recommend if this treatment fails or if a flareup returns soon after this episode did he can try colchicine for change in therapy to see if this works better. Recommended not taking any other medications while taking these medications. Follow up as needed for continued or worsening symptoms  Final Clinical Impressions(s) / UC Diagnoses   Final diagnoses:  Chronic gout involving toe of left foot without tophus, unspecified cause     Discharge Instructions     Take the prednisone as prescribed for 5 days.  Take with food.  Watch your sugar intake and monitor blood sugars.  If this treatment fails I will give you a prescription  for colchicine to try.  Follow up as needed for continued or worsening symptoms  ED Prescriptions    Medication Sig Dispense Auth. Provider   predniSONE (DELTASONE) 10 MG tablet Take 4 tablets (40 mg total) by mouth daily for 5 days. 20 tablet Jedediah Noda A, NP   colchicine 0.6 MG tablet Take 1 tablet (0.6 mg total) by mouth daily. Take 2 tablets as soon as flare starts and then 1 pill an hour later. 3 tablet Loura Halt A, NP     PDMP not reviewed this encounter.   Orvan July, NP 04/24/19 [phone]

## 2019-04-24 NOTE — Telephone Encounter (Signed)
-----   Message from Grayce Sessions, NP sent at 04/22/2019  5:30 PM EDT ----- Elevated cholesterol that can lead to heart attack and stroke. To lower your number you can decrease your fatty foods, red meat, cheese, milk and increase fiber like whole grains and veggies.  Crestor 10mg  sent in

## 2019-04-24 NOTE — ED Triage Notes (Signed)
Pt c/o 8/10 throbbing left foot pain. The side of the 1st left digit is erythematous and has 1+ edema. Pt states his foot is hot.

## 2019-04-24 NOTE — Telephone Encounter (Signed)
Patient is aware of elevated cholesterol which can lead to stroke and heart attack. He was advised to take Crestor and decrease fatty food, red meat, cheese and milk. Advised to increase whole grains and veggies. He verbalized understanding. Maryjean Morn, CMA

## 2019-04-24 NOTE — Discharge Instructions (Signed)
Take the prednisone as prescribed for 5 days.  Take with food.  Watch your sugar intake and monitor blood sugars.  If this treatment fails I will give you a prescription for colchicine to try.  Follow up as needed for continued or worsening symptoms

## 2019-04-30 ENCOUNTER — Telehealth (INDEPENDENT_AMBULATORY_CARE_PROVIDER_SITE_OTHER): Payer: Self-pay

## 2019-04-30 NOTE — Telephone Encounter (Signed)
Sent to PCP ?

## 2019-04-30 NOTE — Telephone Encounter (Signed)
Called patient left message to rtn call [phone]

## 2019-04-30 NOTE — Telephone Encounter (Signed)
Patient called stating that his medication and cause him to feel bad. Patient states he has develop headaches and has been feeling sick. Patient states he has been feeling sluggish and with low energy and feeling hot. Patient would like for PCP to give him a call back.   Please advice [phone]

## 2019-05-08 ENCOUNTER — Telehealth (INDEPENDENT_AMBULATORY_CARE_PROVIDER_SITE_OTHER): Payer: Self-pay

## 2019-05-08 NOTE — Telephone Encounter (Signed)
Patient called requesting to speak PCP in regards to his medication. Patient states that his blood glucose last night was 86 and this afternoon about 30 minutes ago he checked his blood glucose and it dropped to 56. Patient states he drunk orange but all day he has been feeling dizzy and nauseas. Patient states he does not understand why PCP prescribed medication to lower his blood sugars and they are dropping really low.  Please advice [phone]

## 2019-05-09 NOTE — Telephone Encounter (Signed)
Spoke with patient about how he is checking his glucose. Patient is checking sugars sporadically. Sometimes 5 minutes after eating. Patient does not feel like he needs the medication. States he feels sick and nauseas when he takes the medication. Has not taken medication today. Instructed patient on how to properly check glucose.  Patient asked if his sugar is normal can he not take the medication. Explained to him that I do not have the authority to discontinue his medication. As a patient it is his right. He will eat later and wait to check his sugar if his glucose is fine he will not take the medication.

## 2019-05-12 ENCOUNTER — Other Ambulatory Visit (INDEPENDENT_AMBULATORY_CARE_PROVIDER_SITE_OTHER): Payer: Self-pay | Admitting: *Deleted

## 2019-05-12 ENCOUNTER — Telehealth (INDEPENDENT_AMBULATORY_CARE_PROVIDER_SITE_OTHER): Payer: Self-pay

## 2019-05-12 MED ORDER — BLOOD GLUCOSE METER KIT: PACK | 0 refills | Status: DC

## 2019-05-12 MED ORDER — BLOOD GLUC METER DISP-STRIPS DEVI: 1.0000 | Freq: Two times a day (BID) | 0 refills | Status: DC

## 2019-05-12 NOTE — Telephone Encounter (Signed)
Patient aware to p/u rx from fron desk for monitor.

## 2019-05-12 NOTE — Telephone Encounter (Signed)
Patient called requesting a RX for blood glucose meter kit and supplies patient was advice PCP sent the Rx to pharmacy back in April. Patient stated he would call pharmacy and give a call back if he had any problems. Patient called back and stated pharmacy did not have RX. Front desk looked and RX was printed.  Please send RX to [facility] on [address].  Please advice [phone]   Patient will also like a call back from PCP.

## 2019-07-02 ENCOUNTER — Other Ambulatory Visit: Payer: Self-pay

## 2019-07-02 ENCOUNTER — Encounter: Payer: Self-pay | Admitting: Registered"

## 2019-07-02 ENCOUNTER — Encounter: Payer: 59 | Attending: Family Medicine | Admitting: Registered"

## 2019-07-02 DIAGNOSIS — E119 Type 2 diabetes mellitus without complications: Secondary | ICD-10-CM | POA: Diagnosis not present

## 2019-07-02 NOTE — Progress Notes (Signed)
Diabetes Self-Management Education  Visit Type:  First/Initial  Appt. Start Time: 8:10 Appt. End Time: 9:00  07/02/2019  [name], identified by name and date of birth, is a 59 y.o. male with a diagnosis of Diabetes: Type 2.   ASSESSMENT  Pt expectations: none reported  Recent A1c sent as physician note was 8.9. Pt states he has done a lot of research related to diabetes. States he checks his BS 2x/day: FBS (111-130) and late at night (120-139). States one morning it was 167 and he knows why it was high. States he is no longer drinking sodas; replaced with water and flavor packs. States he eats brown rice, whole wheat bread, almonds, and salads. Reports using olive oil when cooking. States he has lost 10-12 lbs since previous doctor's appointment.   Pt states he is not taking his diabetes medications because his numbers are well-managed. Reports only taking medications related to heart health.   States his job involves heavy lifting, 8 hrs, 5 days/week.  Sweats a lot at work.   There were no vitals taken for this visit. There is no height or weight on file to calculate BMI.    Diabetes Self-Management Education - 07/02/19 0825      Health Coping   How would you rate your overall health? Good      Psychosocial Assessment   Patient Belief/Attitude about Diabetes Motivated to manage diabetes    Self-care barriers None    Self-management support Doctor's office    Patient Concerns Nutrition/Meal planning    Special Needs None    Preferred Learning Style No preference indicated    Learning Readiness Change in progress      Complications   Last HgB A1C per patient/outside source 8.9 %    How often do you check your blood sugar? 1-2 times/day    Fasting Blood glucose range (mg/dL) 10-175    Postprandial Blood glucose range (mg/dL) 102-585;27-782    Number of hypoglycemic episodes per month 1    Can you tell when your blood sugar is low? Yes    What do you do if your blood  sugar is low? drunk orange juice and ate    Number of hyperglycemic episodes per week 0    Have you had a dilated eye exam in the past 12 months? Yes    Have you had a dental exam in the past 12 months? No    Are you checking your feet? No      Dietary Intake   Breakfast boiled eggs    Snack (morning) Brat    Lunch salad    Dinner rotisserie chicken + tortilla + cheese + picante sauce    Snack (evening) peanut butter crackers or almonds    Beverage(s) water with flavored packs (3-4*16 oz; 64 oz), beer      Exercise   Exercise Type ADL's   job requires heavy lifting     Patient Education   Previous Diabetes Education No    Disease state  Definition of diabetes, type 1 and 2, and the diagnosis of diabetes;Factors that contribute to the development of diabetes    Nutrition management  Food label reading, portion sizes and measuring food.    Physical activity and exercise  Role of exercise on diabetes management, blood pressure control and cardiac health.    Medications Reviewed patients medication for diabetes, action, purpose, timing of dose and side effects.    Monitoring Purpose and frequency of SMBG.;Taught/discussed recording of  test results and interpretation of SMBG.;Interpreting lab values - A1C, lipid, urine microalbumina.;Identified appropriate SMBG and/or A1C goals.;Daily foot exams;Yearly dilated eye exam    Acute complications Taught treatment of hypoglycemia - the 15 rule.;Discussed and identified patients' treatment of hyperglycemia.    Chronic complications Assessed and discussed foot care and prevention of foot problems;Lipid levels, blood glucose control and heart disease;Retinopathy and reason for yearly dilated eye exams;Nephropathy, what it is, prevention of, the use of ACE, ARB's and early detection of through urine microalbumia.;Reviewed with patient heart disease, higher risk of, and prevention;Applicable immunizations;Relationship between chronic complications and blood  glucose control;Identified and discussed with patient  current chronic complications    Psychosocial adjustment Role of stress on diabetes      Individualized Goals (developed by patient)   Nutrition Follow meal plan discussed;General guidelines for healthy choices and portions discussed    Physical Activity Exercise 5-7 days per week;30 minutes per day    Medications take my medication as prescribed    Monitoring  test my blood glucose as discussed    Reducing Risk examine blood glucose patterns;do foot checks daily;treat hypoglycemia with 15 grams of carbs if blood glucose less than 70mg /dL    Health Coping Not Applicable      Post-Education Assessment   Patient understands the diabetes disease and treatment process. Demonstrates understanding / competency    Patient understands incorporating nutritional management into lifestyle. Demonstrates understanding / competency    Patient undertands incorporating physical activity into lifestyle. Demonstrates understanding / competency    Patient understands using medications safely. Demonstrates understanding / competency    Patient understands monitoring blood glucose, interpreting and using results Demonstrates understanding / competency    Patient understands prevention, detection, and treatment of acute complications. Demonstrates understanding / competency    Patient understands prevention, detection, and treatment of chronic complications. Demonstrates understanding / competency    Patient understands how to develop strategies to address psychosocial issues. Demonstrates understanding / competency    Patient understands how to develop strategies to promote health/change behavior. Demonstrates understanding / competency      Outcomes   Program Status Completed           Learning Objective:  Patient will have a greater understanding of diabetes self-management. Patient education plan is to attend individual and/or group sessions per  assessed needs and concerns.   Plan:   Patient Instructions  Goals:  Follow Diabetes Meal Plan as instructed  Eat 3 meals and 2 snacks, every 3-5 hrs  Aim to have 1/2 plate non-starchy vegetables with lunch and dinner + 1/4 plate protein + 1/4 plate starch/grain  Add lean protein foods to meals/snacks  Monitor glucose levels as instructed by your doctor  Aim for 30 mins of physical activity daily  Bring food record and glucose log to your next nutrition visit  Keep up the great work checking your blood sugar throughout the day!     Expected Outcomes:  Demonstrated limited interest in learning.  Expect minimal changes  Education material provided: ADA - How to Thrive: A Guide for Your Journey with Diabetes  If problems or questions, patient to contact team via:  Phone and Email  Future DSME appointment: - PRN

## 2019-07-02 NOTE — Patient Instructions (Addendum)
Goals:  Follow Diabetes Meal Plan as instructed  Eat 3 meals and 2 snacks, every 3-5 hrs  Aim to have 1/2 plate non-starchy vegetables with lunch and dinner + 1/4 plate protein + 1/4 plate starch/grain  Add lean protein foods to meals/snacks  Monitor glucose levels as instructed by your doctor  Aim for 30 mins of physical activity daily  Bring food record and glucose log to your next nutrition visit  Keep up the great work checking your blood sugar throughout the day!

## 2020-05-13 ENCOUNTER — Encounter (HOSPITAL_COMMUNITY): Payer: Self-pay

## 2020-05-13 ENCOUNTER — Ambulatory Visit (HOSPITAL_COMMUNITY)
Admission: EM | Admit: 2020-05-13 | Discharge: 2020-05-13 | Disposition: A | Discharge status: Home / Self Care | Payer: 59 | Attending: Family Medicine | Admitting: Family Medicine

## 2020-05-13 ENCOUNTER — Other Ambulatory Visit: Payer: Self-pay

## 2020-05-13 DIAGNOSIS — K0889 Other specified disorders of teeth and supporting structures: Secondary | ICD-10-CM

## 2020-05-13 MED ORDER — AMOXICILLIN-POT CLAVULANATE 875-125 MG PO TABS: 1.0000 | ORAL_TABLET | Freq: Two times a day (BID) | ORAL | 0 refills | Status: DC

## 2020-05-13 MED ORDER — LIDOCAINE VISCOUS HCL 2 % MT SOLN: 10.0000 mL | OROMUCOSAL | 0 refills | Status: DC | PRN

## 2020-05-13 NOTE — ED Provider Notes (Signed)
[facility]    CSN: [id number] Arrival date & time: 05/13/20  1761      History   Chief Complaint Chief Complaint  Patient presents with  . Dental Pain    HPI Edward Cabrera is a 60 y.o. male.   Patient presenting today with 4-day history of right upper gingival pain, redness, swelling and now with facial swelling.  Denies fever, chills, drainage from the area, sweats, body aches.  Notes he is needing extensive dental work in this area but is trying to save of the money to do so at this time.  So far trying salt water gargles and Listerine with minimal relief.     Past Medical History:  Diagnosis Date  . Diabetes mellitus without complication ([place])   . Gout   . Hypertension   . Pulmonary embolism ([facility])     There are no problems to display for this patient.   Past Surgical History:  Procedure Laterality Date  . ROTATOR CUFF REPAIR Left        Home Medications    Prior to Admission medications   Medication Sig Start Date End Date Taking? Authorizing Provider  amoxicillin-clavulanate (AUGMENTIN) 875-125 MG tablet Take 1 tablet by mouth every 12 (twelve) hours. 05/13/20  Yes Volney American, PA-C  lidocaine (XYLOCAINE) 2 % solution Use as directed 10 mLs in the mouth or throat as needed for mouth pain. 05/13/20  Yes Volney American, PA-C  acetaminophen (TYLENOL) 325 MG tablet Take 650 mg by mouth every 6 (six) hours as needed for mild pain.    [provider]  amLODipine (NORVASC) 10 MG tablet Take 1 tablet (10 mg total) by mouth daily. 04/21/19   [name], NP  atorvastatin (LIPITOR) 10 MG tablet Take 10 mg by mouth daily. Patient not taking: Reported on 07/02/2019 12/27/17   [provider]  Blood Gluc Meter Disp-Strips (BLOOD GLUCOSE METER DISPOSABLE) DEVI 1 kit by Does not apply route 2 (two) times daily. 05/12/19   [name], NP  blood glucose meter kit and supplies Dispense based on patient and insurance  preference. Use up to four times daily as directed. (FOR ICD-10 E10.9, E11.9). 05/12/19   [name], NP  colchicine 0.6 MG tablet Take 1 tablet (0.6 mg total) by mouth daily. Take 2 tablets as soon as flare starts and then 1 pill an hour later. Patient not taking: Reported on 07/02/2019 04/24/19   Loura Halt A, NP  glipiZIDE (GLUCOTROL) 10 MG tablet Take 1 tablet (10 mg total) by mouth 2 (two) times daily before a meal. Patient not taking: Reported on 07/02/2019 04/21/19   [name], NP  lisinopril-hydrochlorothiazide (ZESTORETIC) 10-12.5 MG tablet Take 1 tablet by mouth daily. 04/21/19   [name], NP  rosuvastatin (CRESTOR) 10 MG tablet Take 1 tablet (10 mg total) by mouth daily. Patient not taking: Reported on 07/02/2019 04/22/19   [name], NP  sitaGLIPtin-metformin (JANUMET) 50-1000 MG tablet Take 1 tablet by mouth 2 (two) times daily with a meal. Patient not taking: Reported on 07/02/2019 04/21/19   [name], NP    Family History Family History  Problem Relation Age of Onset  . Healthy Mother   . Diabetes Other     Social History Social History   Tobacco Use  . Smoking status: Never Smoker  . Smokeless tobacco: Never Used  Vaping Use  . Vaping Use: Never used  Substance Use Topics  .  Alcohol use: Not Currently  . Drug use: Yes    Types: Marijuana     Allergies   Iohexol   Review of Systems Review of Systems Per HPI Physical Exam Triage Vital Signs ED Triage Vitals [05/13/20 0845]  Enc Vitals Group     BP (!) 144/88     Pulse Rate 77     Resp 16     Temp 98.2 F (36.8 C)     Temp Source Oral     SpO2 98 %     Weight      Height      Head Circumference      Peak Flow      Pain Score 8     Pain Loc      Pain Edu?      Excl. in [place]?    No data found.  Updated Vital Signs BP (!) 144/88 (BP Location: Left Arm)   Pulse 77   Temp 98.2 F (36.8 C) (Oral)   Resp 16   SpO2 98%   Visual Acuity Right Eye  Distance:   Left Eye Distance:   Bilateral Distance:    Right Eye Near:   Left Eye Near:    Bilateral Near:     Physical Exam Vitals and nursing note reviewed.  Constitutional:      Appearance: Normal appearance.  HENT:     Head: Atraumatic.     Mouth/Throat:     Mouth: Mucous membranes are moist.     Pharynx: Posterior oropharyngeal erythema present. No oropharyngeal exudate.     Comments: Right upper jaw gingiva diffusely erythematous and edematous, poor dentition with multiple missing teeth and decayed teeth Eyes:     Extraocular Movements: Extraocular movements intact.     Conjunctiva/sclera: Conjunctivae normal.  Cardiovascular:     Rate and Rhythm: Normal rate and regular rhythm.  Pulmonary:     Effort: Pulmonary effort is normal.     Breath sounds: Normal breath sounds.  Musculoskeletal:        General: Normal range of motion.     Cervical back: Normal range of motion and neck supple.  Lymphadenopathy:     Cervical: No cervical adenopathy.  Skin:    General: Skin is warm and dry.  Neurological:     General: No focal deficit present.     Mental Status: He is oriented to person, place, and time.  Psychiatric:        Mood and Affect: Mood normal.        Thought Content: Thought content normal.        Judgment: Judgment normal.      UC Treatments / Results  Labs (all labs ordered are listed, but only abnormal results are displayed) Labs Reviewed - No data to display  EKG   Radiology No results found.  Procedures Procedures (including critical care time)  Medications Ordered in UC Medications - No data to display  Initial Impression / Assessment and Plan / UC Course  I have reviewed the triage vital signs and the nursing notes.  Pertinent labs & imaging results that were available during my care of the patient were reviewed by me and considered in my medical decision making (see chart for details).     We will start Augmentin, viscous lidocaine  continue salt water gargles and follow-up with dental provider as soon as possible.  Over-the-counter pain relievers as needed.  Return for acutely worsening symptoms.  Final Clinical Impressions(s) / UC Diagnoses  Final diagnoses:  Pain, dental   Discharge Instructions   None    ED Prescriptions    Medication Sig Dispense Auth. Provider   amoxicillin-clavulanate (AUGMENTIN) 875-125 MG tablet Take 1 tablet by mouth every 12 (twelve) hours. 14 tablet Volney American, [place]   lidocaine (XYLOCAINE) 2 % solution Use as directed 10 mLs in the mouth or throat as needed for mouth pain. 100 mL Volney American, [place]     PDMP not reviewed this encounter.   Merrie Roof [employer], [place] 05/13/20 [phone]

## 2020-05-13 NOTE — ED Triage Notes (Signed)
C/o dental pain x 4 days. He states he has been having gum swelling.

## 2020-06-15 ENCOUNTER — Other Ambulatory Visit: Payer: Self-pay

## 2020-06-15 ENCOUNTER — Encounter (HOSPITAL_COMMUNITY): Payer: Self-pay

## 2020-06-15 ENCOUNTER — Ambulatory Visit (HOSPITAL_COMMUNITY)
Admission: EM | Admit: 2020-06-15 | Discharge: 2020-06-15 | Disposition: A | Discharge status: Home / Self Care | Payer: 59 | Attending: Student | Admitting: Student

## 2020-06-15 DIAGNOSIS — N529 Male erectile dysfunction, unspecified: Secondary | ICD-10-CM | POA: Diagnosis not present

## 2020-06-15 DIAGNOSIS — G44209 Tension-type headache, unspecified, not intractable: Secondary | ICD-10-CM

## 2020-06-15 DIAGNOSIS — Z760 Encounter for issue of repeat prescription: Secondary | ICD-10-CM

## 2020-06-15 DIAGNOSIS — Z0289 Encounter for other administrative examinations: Secondary | ICD-10-CM

## 2020-06-15 DIAGNOSIS — I10 Essential (primary) hypertension: Secondary | ICD-10-CM | POA: Diagnosis not present

## 2020-06-15 MED ORDER — LISINOPRIL-HYDROCHLOROTHIAZIDE 10-12.5 MG PO TABS: 1.0000 | ORAL_TABLET | Freq: Every day | ORAL | 0 refills | Status: DC

## 2020-06-15 MED ORDER — SILDENAFIL CITRATE 100 MG PO TABS: 100.0000 mg | ORAL_TABLET | Freq: Every day | ORAL | 0 refills | Status: DC | PRN

## 2020-06-15 MED ORDER — AMLODIPINE BESYLATE 10 MG PO TABS: 10.0000 mg | ORAL_TABLET | Freq: Every day | ORAL | 0 refills | Status: DC

## 2020-06-15 NOTE — Discharge Instructions (Addendum)
-  Restart medications as directed -Follow-up with new PCP as scheduled in about 1 week -Please check your blood pressure at home or at the pharmacy. If this continues to be >140/90, follow-up with your primary care provider for further blood pressure management/ medication titration. If you develop chest pain, shortness of breath, vision changes, the worst headache of your life- head straight to the ED or call 911.

## 2020-06-15 NOTE — ED Triage Notes (Signed)
Pt is here today for a refill on Amlodipine 10 mg, Sildenafil 100 mg and Lisinopril 10 mg. Pt states he is currently in the process of switching PCP's and has not been able to get a refill. Pt states he has developed a headache over the past few days due to not having any pills.

## 2020-06-15 NOTE — ED Provider Notes (Signed)
[facility]    CSN: [id number] Arrival date & time: 06/15/20  0804      History   Chief Complaint Chief Complaint  Patient presents with   Medication Refill    HPI Edward Cabrera is a 60 y.o. male presenting for medication refills.  States his old primary care left and he does not establish care with his new PCP for about 1 more week.  Has been out of his blood pressure medications and Viagra for 2 days now.  States his blood pressure typically runs 110/80, so current reading is a little bit high.  He does note a 3/10 throbbing headache behind his forehead, states this is not unusual for him.  Adamantly denies vision changes, weakness in arms or legs, dizziness, chest pain, shortness of breath.  States he has been on all of these medications for years without issue.  He is also requesting work note.  HPI  Past Medical History:  Diagnosis Date   Diabetes mellitus without complication ([place])    Gout    Hypertension    Pulmonary embolism ([place])     There are no problems to display for this patient.   Past Surgical History:  Procedure Laterality Date   ROTATOR CUFF REPAIR Left        Home Medications    Prior to Admission medications   Medication Sig Start Date End Date Taking? Authorizing Provider  sildenafil (VIAGRA) 100 MG tablet Take 1 tablet (100 mg total) by mouth daily as needed for erectile dysfunction. 06/15/20  Yes Hazel Sams, PA-C  acetaminophen (TYLENOL) 325 MG tablet Take 650 mg by mouth every 6 (six) hours as needed for mild pain.    [provider]  amLODipine (NORVASC) 10 MG tablet Take 1 tablet (10 mg total) by mouth daily. 06/15/20   Hazel Sams, PA-C  amoxicillin-clavulanate (AUGMENTIN) 875-125 MG tablet Take 1 tablet by mouth every 12 (twelve) hours. 05/13/20   Volney American, PA-C  atorvastatin (LIPITOR) 10 MG tablet Take 10 mg by mouth daily. Patient not taking: Reported on 07/02/2019 12/27/17   [provider]   Blood Gluc Meter Disp-Strips (BLOOD GLUCOSE METER DISPOSABLE) DEVI 1 kit by Does not apply route 2 (two) times daily. 05/12/19   [name], NP  blood glucose meter kit and supplies Dispense based on patient and insurance preference. Use up to four times daily as directed. (FOR ICD-10 E10.9, E11.9). 05/12/19   [name], NP  colchicine 0.6 MG tablet Take 1 tablet (0.6 mg total) by mouth daily. Take 2 tablets as soon as flare starts and then 1 pill an hour later. Patient not taking: Reported on 07/02/2019 04/24/19   Loura Halt A, NP  glipiZIDE (GLUCOTROL) 10 MG tablet Take 1 tablet (10 mg total) by mouth 2 (two) times daily before a meal. Patient not taking: Reported on 07/02/2019 04/21/19   [name], NP  lidocaine (XYLOCAINE) 2 % solution Use as directed 10 mLs in the mouth or throat as needed for mouth pain. 05/13/20   Volney American, PA-C  lisinopril-hydrochlorothiazide (ZESTORETIC) 10-12.5 MG tablet Take 1 tablet by mouth daily. 06/15/20   Hazel Sams, PA-C  rosuvastatin (CRESTOR) 10 MG tablet Take 1 tablet (10 mg total) by mouth daily. Patient not taking: Reported on 07/02/2019 04/22/19   [name], NP  sitaGLIPtin-metformin (JANUMET) 50-1000 MG tablet Take 1 tablet by mouth 2 (two) times daily with a meal. Patient not taking: Reported  on 07/02/2019 04/21/19   Grayce Sessions, NP    Family History Family History  Problem Relation Age of Onset   Healthy Mother    Diabetes Other     Social History Social History   Tobacco Use   Smoking status: Never   Smokeless tobacco: Never  Vaping Use   Vaping Use: Never used  Substance Use Topics   Alcohol use: Not Currently   Drug use: Yes    Types: Marijuana     Allergies   Iohexol   Review of Systems Review of Systems  Constitutional:  Negative for appetite change, chills and fever.  HENT:  Negative for congestion, ear pain, rhinorrhea, sinus pressure, sinus pain and sore throat.    Eyes:  Negative for redness and visual disturbance.  Respiratory:  Negative for apnea, cough, choking, chest tightness, shortness of breath, wheezing and stridor.   Cardiovascular:  Negative for chest pain, palpitations and leg swelling.  Gastrointestinal:  Negative for abdominal pain, constipation, diarrhea, nausea and vomiting.  Genitourinary:  Negative for decreased urine volume, dysuria, enuresis, flank pain, frequency, genital sores, hematuria, penile discharge, penile pain, penile swelling, scrotal swelling, testicular pain and urgency.  Musculoskeletal:  Negative for myalgias.  Neurological:  Negative for dizziness, weakness and headaches.  Psychiatric/Behavioral:  Negative for confusion.   All other systems reviewed and are negative.   Physical Exam Triage Vital Signs ED Triage Vitals  Enc Vitals Group     BP      Pulse      Resp      Temp      Temp src      SpO2      Weight      Height      Head Circumference      Peak Flow      Pain Score      Pain Loc      Pain Edu?      Excl. in [place]?    No data found.  Updated Vital Signs BP (!) 149/94 (BP Location: Left Arm)   Pulse 76   Temp 98.7 F (37.1 C) (Oral)   Resp 17   Visual Acuity Right Eye Distance:   Left Eye Distance:   Bilateral Distance:    Right Eye Near:   Left Eye Near:    Bilateral Near:     Physical Exam Vitals reviewed.  Constitutional:      General: He is not in acute distress.    Appearance: Normal appearance. He is not ill-appearing or diaphoretic.  HENT:     Head: Normocephalic and atraumatic.  Eyes:     Extraocular Movements: Extraocular movements intact.     Pupils: Pupils are equal, round, and reactive to light.  Cardiovascular:     Rate and Rhythm: Normal rate and regular rhythm.     Heart sounds: Normal heart sounds.  Pulmonary:     Effort: Pulmonary effort is normal.     Breath sounds: Normal breath sounds.  Skin:    General: Skin is warm.     Capillary Refill: Capillary  refill takes less than 2 seconds.  Neurological:     General: No focal deficit present.     Mental Status: He is alert and oriented to person, place, and time.     Comments: CN 2-12 grossly intact PERRLA, EOMI  Psychiatric:        Mood and Affect: Mood normal.        Behavior: Behavior normal.  Thought Content: Thought content normal.        Judgment: Judgment normal.     UC Treatments / Results  Labs (all labs ordered are listed, but only abnormal results are displayed) Labs Reviewed - No data to display  EKG   Radiology No results found.  Procedures Procedures (including critical care time)  Medications Ordered in UC Medications - No data to display  Initial Impression / Assessment and Plan / UC Course  I have reviewed the triage vital signs and the nursing notes.  Pertinent labs & imaging results that were available during my care of the patient were reviewed by me and considered in my medical decision making (see chart for details).     This patient is a 60 year old male presenting for medication refills- hypertension and erectile dysfunction. Also presenting with tension headache.   Patient has been out of his amlodipine and lisinopril-hydrochlorothiazide for 2 days, blood pressure is only mildly elevated at 149/94.  Recommended restarting medications and monitoring this at home.  Also refilled sildenafil as below.  Refill sent for 1 month.  He has been on all these medications for years.  Adamantly denies or worst headache of life, vision changes, weakness in arms or legs.  Benign exam.  Follow-up with primary care as scheduled in about 1 week.  ED return precautions discussed. Patient verbalizes understanding and agreement.   Coding this visit a level 4 for 2 stable chronic illnesses and prescription drug management.  Final Clinical Impressions(s) / UC Diagnoses   Final diagnoses:  Medication refill  Essential hypertension  Tension headache   Erectile dysfunction, unspecified erectile dysfunction type  Encounter to obtain excuse from work     Discharge Instructions      -Restart medications as directed -Follow-up with new PCP as scheduled in about 1 week -Please check your blood pressure at home or at the pharmacy. If this continues to be >140/90, follow-up with your primary care provider for further blood pressure management/ medication titration. If you develop chest pain, shortness of breath, vision changes, the worst headache of your life- head straight to the ED or call 911.      ED Prescriptions     Medication Sig Dispense Auth. Provider   sildenafil (VIAGRA) 100 MG tablet Take 1 tablet (100 mg total) by mouth daily as needed for erectile dysfunction. 30 tablet Hazel Sams, PA-C   lisinopril-hydrochlorothiazide (ZESTORETIC) 10-12.5 MG tablet Take 1 tablet by mouth daily. 30 tablet Hazel Sams, PA-C   amLODipine (NORVASC) 10 MG tablet Take 1 tablet (10 mg total) by mouth daily. 30 tablet Hazel Sams, PA-C      PDMP not reviewed this encounter.   Hazel Sams, PA-C 06/15/20 [phone]

## 2020-07-28 ENCOUNTER — Other Ambulatory Visit (INDEPENDENT_AMBULATORY_CARE_PROVIDER_SITE_OTHER): Payer: Self-pay | Admitting: Primary Care

## 2020-07-28 DIAGNOSIS — I10 Essential (primary) hypertension: Secondary | ICD-10-CM

## 2020-07-28 NOTE — Telephone Encounter (Signed)
Medication Refill - Medication: amLODipine (NORVASC) 10 MG tablet   lisinopril-hydrochlorothiazide (ZESTORETIC) 10-12.5 MG   sildenafil (VIAGRA) 100 MG tablet   Has the patient contacted their pharmacy? Yes.   (Agent: If no, request that the patient contact the pharmacy for the refill.) (Agent: If yes, when and what did the pharmacy advise?)  Preferred Pharmacy (with phone number or street name):  [id number] - [place] - [address]  [address] [place] [address]  Phone: [phone] Fax: [phone]    Agent: Please be advised that RX refills may take up to 3 business days. We ask that you follow-up with your pharmacy.

## 2020-07-28 NOTE — Telephone Encounter (Signed)
   Notes to clinic:  medication filled by a different provider  Review for refill    Requested Prescriptions  Pending Prescriptions Disp Refills   lisinopril-hydrochlorothiazide (ZESTORETIC) 10-12.5 MG tablet 30 tablet 0    Sig: Take 1 tablet by mouth daily.      Cardiovascular:  ACEI + Diuretic Combos Failed - 07/28/2020 12:42 PM      Failed - Na in normal range and within 180 days    Sodium  Date Value Ref Range Status  04/21/2019 141 134 - 144 mmol/L Final          Failed - K in normal range and within 180 days    Potassium  Date Value Ref Range Status  04/21/2019 4.1 3.5 - 5.2 mmol/L Final          Failed - Cr in normal range and within 180 days    Creatinine, Ser  Date Value Ref Range Status  04/21/2019 1.26 0.76 - 1.27 mg/dL Final          Failed - Ca in normal range and within 180 days    Calcium  Date Value Ref Range Status  04/21/2019 9.2 8.7 - 10.2 mg/dL Final          Failed - Last BP in normal range    BP Readings from Last 1 Encounters:  06/15/20 (!) 149/94          Failed - Valid encounter within last 6 months    Recent Outpatient Visits           1 year ago Screening for diabetes mellitus   [facility] Grayce Sessions, NP       Future Appointments             In 2 weeks Grayce Sessions, NP [facility]             Passed - Patient is not pregnant        amLODipine (NORVASC) 10 MG tablet 30 tablet 0    Sig: Take 1 tablet (10 mg total) by mouth daily.      Cardiovascular:  Calcium Channel Blockers Failed - 07/28/2020 12:42 PM      Failed - Last BP in normal range    BP Readings from Last 1 Encounters:  06/15/20 (!) 149/94          Failed - Valid encounter within last 6 months    Recent Outpatient Visits           1 year ago Screening for diabetes mellitus   [facility] Grayce Sessions, NP       Future Appointments             In 2 weeks  Grayce Sessions, NP [facility]               sildenafil (VIAGRA) 100 MG tablet 30 tablet 0    Sig: Take 1 tablet (100 mg total) by mouth daily as needed for erectile dysfunction.      There is no refill protocol information for this order

## 2020-07-29 ENCOUNTER — Other Ambulatory Visit (INDEPENDENT_AMBULATORY_CARE_PROVIDER_SITE_OTHER): Payer: Self-pay | Admitting: Primary Care

## 2020-07-29 MED ORDER — SILDENAFIL CITRATE 100 MG PO TABS: 100.0000 mg | ORAL_TABLET | Freq: Every day | ORAL | 0 refills | Status: DC | PRN

## 2020-07-29 MED ORDER — LISINOPRIL-HYDROCHLOROTHIAZIDE 10-12.5 MG PO TABS
1.0000 | ORAL_TABLET | Freq: Every day | ORAL | 0 refills | Status: DC
Start: 2020-07-29 — End: 2020-11-11

## 2020-07-29 MED ORDER — AMLODIPINE BESYLATE 10 MG PO TABS: 10.0000 mg | ORAL_TABLET | Freq: Every day | ORAL | 0 refills | Status: DC

## 2020-08-11 ENCOUNTER — Ambulatory Visit (INDEPENDENT_AMBULATORY_CARE_PROVIDER_SITE_OTHER): Payer: 59 | Admitting: Nurse Practitioner

## 2020-08-11 ENCOUNTER — Encounter (INDEPENDENT_AMBULATORY_CARE_PROVIDER_SITE_OTHER): Payer: Self-pay | Admitting: Nurse Practitioner

## 2020-08-11 ENCOUNTER — Other Ambulatory Visit: Payer: Self-pay

## 2020-08-11 VITALS — BP 122/85 | HR 82 | Temp 97.5°F | Ht 68.0 in | Wt 224.6 lb

## 2020-08-11 DIAGNOSIS — I10 Essential (primary) hypertension: Secondary | ICD-10-CM

## 2020-08-11 DIAGNOSIS — E119 Type 2 diabetes mellitus without complications: Secondary | ICD-10-CM | POA: Diagnosis not present

## 2020-08-11 DIAGNOSIS — Z23 Encounter for immunization: Secondary | ICD-10-CM

## 2020-08-11 LAB — POCT GLYCOSYLATED HEMOGLOBIN (HGB A1C): Hemoglobin A1C: 7.3 % — AB (ref 4.0–5.6)

## 2020-08-11 NOTE — Patient Instructions (Addendum)
Hypertension Diabetes:   HGB A1C was 7.3 today, improved from HGB A1C 8.9 at last visit  Blood pressure stable in office today   Continue current medications  Heart healthy diabetic diet  Stay active   Follow up:  Follow up in 3 months or sooner if needed Hypertension, Adult High blood pressure (hypertension) is when the force of blood pumping through the arteries is too strong. The arteries are the blood vessels that carry blood from the heart throughout the body. Hypertension forces the heart to work harder to pump blood and may cause arteries to become narrow or stiff. Untreated or uncontrolled hypertension can cause a heart attack, heart failure, a stroke, kidney disease, and otherproblems. A blood pressure reading consists of a higher number over a lower number. Ideally, your blood pressure should be below 120/80. The first ("top") number is called the systolic pressure. It is a measure of the pressure in your arteries as your heart beats. The second ("bottom") number is called the diastolic pressure. It is a measure of the pressure in your arteries as theheart relaxes. What are the causes? The exact cause of this condition is not known. There are some conditions thatresult in or are related to high blood pressure. What increases the risk? Some risk factors for high blood pressure are under your control. The following factors may make you more likely to develop this condition: Smoking. Having type 2 diabetes mellitus, high cholesterol, or both. Not getting enough exercise or physical activity. Being overweight. Having too much fat, sugar, calories, or salt (sodium) in your diet. Drinking too much alcohol. Some risk factors for high blood pressure may be difficult or impossible to change. Some of these factors include: Having chronic kidney disease. Having a family history of high blood pressure. Age. Risk increases with age. Race. You may be at higher risk if you are African  American. Gender. Men are at higher risk than women before age 14. After age 32, women are at higher risk than men. Having obstructive sleep apnea. Stress. What are the signs or symptoms? High blood pressure may not cause symptoms. Very high blood pressure (hypertensive crisis) may cause: Headache. Anxiety. Shortness of breath. Nosebleed. Nausea and vomiting. Vision changes. Severe chest pain. Seizures. How is this diagnosed? This condition is diagnosed by measuring your blood pressure while you are seated, with your arm resting on a flat surface, your legs uncrossed, and your feet flat on the floor. The cuff of the blood pressure monitor will be placed directly against the skin of your upper arm at the level of your heart. It should be measured at least twice using the same arm. Certain conditions cancause a difference in blood pressure between your right and left arms. Certain factors can cause blood pressure readings to be lower or higher than normal for a short period of time: When your blood pressure is higher when you are in a health care provider's office than when you are at home, this is called white coat hypertension. Most people with this condition do not need medicines. When your blood pressure is higher at home than when you are in a health care provider's office, this is called masked hypertension. Most people with this condition may need medicines to control blood pressure. If you have a high blood pressure reading during one visit or you have normal blood pressure with other risk factors, you may be asked to: Return on a different day to have your blood pressure checked again. Monitor  your blood pressure at home for 1 week or longer. If you are diagnosed with hypertension, you may have other blood or imaging tests to help your health care provider understand your overall risk for otherconditions. How is this treated? This condition is treated by making healthy lifestyle  changes, such as eating healthy foods, exercising more, and reducing your alcohol intake. Your health care provider may prescribe medicine if lifestyle changes are not enough to get your blood pressure under control, and if: Your systolic blood pressure is above 130. Your diastolic blood pressure is above 80. Your personal target blood pressure may vary depending on your medicalconditions, your age, and other factors. Follow these instructions at home: Eating and drinking  Eat a diet that is high in fiber and potassium, and low in sodium, added sugar, and fat. An example eating plan is called the DASH (Dietary Approaches to Stop Hypertension) diet. To eat this way: Eat plenty of fresh fruits and vegetables. Try to fill one half of your plate at each meal with fruits and vegetables. Eat whole grains, such as whole-wheat pasta, brown rice, or whole-grain bread. Fill about one fourth of your plate with whole grains. Eat or drink low-fat dairy products, such as skim milk or low-fat yogurt. Avoid fatty cuts of meat, processed or cured meats, and poultry with skin. Fill about one fourth of your plate with lean proteins, such as fish, chicken without skin, beans, eggs, or tofu. Avoid pre-made and processed foods. These tend to be higher in sodium, added sugar, and fat. Reduce your daily sodium intake. Most people with hypertension should eat less than 1,500 mg of sodium a day. Do not drink alcohol if: Your health care provider tells you not to drink. You are pregnant, may be pregnant, or are planning to become pregnant. If you drink alcohol: Limit how much you use to: 0-1 drink a day for women. 0-2 drinks a day for men. Be aware of how much alcohol is in your drink. In the [place] one drink equals one 12 oz bottle of beer (355 mL), one 5 oz glass of wine (148 mL), or one 1 oz glass of hard liquor (44 mL).  Lifestyle  Work with your health care provider to maintain a healthy body weight or to lose  weight. Ask what an ideal weight is for you. Get at least 30 minutes of exercise most days of the week. Activities may include walking, swimming, or biking. Include exercise to strengthen your muscles (resistance exercise), such as Pilates or lifting weights, as part of your weekly exercise routine. Try to do these types of exercises for 30 minutes at least 3 days a week. Do not use any products that contain nicotine or tobacco, such as cigarettes, e-cigarettes, and chewing tobacco. If you need help quitting, ask your health care provider. Monitor your blood pressure at home as told by your health care provider. Keep all follow-up visits as told by your health care provider. This is important.  Medicines Take over-the-counter and prescription medicines only as told by your health care provider. Follow directions carefully. Blood pressure medicines must be taken as prescribed. Do not skip doses of blood pressure medicine. Doing this puts you at risk for problems and can make the medicine less effective. Ask your health care provider about side effects or reactions to medicines that you should watch for. Contact a health care provider if you: Think you are having a reaction to a medicine you are taking. Have headaches that  keep coming back (recurring). Feel dizzy. Have swelling in your ankles. Have trouble with your vision. Get help right away if you: Develop a severe headache or confusion. Have unusual weakness or numbness. Feel faint. Have severe pain in your chest or abdomen. Vomit repeatedly. Have trouble breathing. Summary Hypertension is when the force of blood pumping through your arteries is too strong. If this condition is not controlled, it may put you at risk for serious complications. Your personal target blood pressure may vary depending on your medical conditions, your age, and other factors. For most people, a normal blood pressure is less than 120/80. Hypertension is treated  with lifestyle changes, medicines, or a combination of both. Lifestyle changes include losing weight, eating a healthy, low-sodium diet, exercising more, and limiting alcohol. This information is not intended to replace advice given to you by your health care provider. Make sure you discuss any questions you have with your healthcare provider. Document Revised: 08/29/2017 Document Reviewed: 08/29/2017 Elsevier Patient Education  2022 [employer]  Managing Your Hypertension Hypertension, also called high blood pressure, is when the force of the blood pressing against the walls of the arteries is too strong. Arteries are blood vessels that carry blood from your heart throughout your body. Hypertension forces the heart to work harder to pump blood and may cause the arteries tobecome narrow or stiff. Understanding blood pressure readings Your personal target blood pressure may vary depending on your medical conditions, your age, and other factors. A blood pressure reading includes a higher number over a lower number. Ideally, your blood pressure should be below 120/80. You should know that: The first, or top, number is called the systolic pressure. It is a measure of the pressure in your arteries as your heart beats. The second, or bottom number, is called the diastolic pressure. It is a measure of the pressure in your arteries as the heart relaxes. Blood pressure is classified into four stages. Based on your blood pressure reading, your health care provider may use the following stages to determine what type of treatment you need, if any. Systolic pressure and diastolicpressure are measured in a unit called mmHg. Normal Systolic pressure: below 120. Diastolic pressure: below 80. Elevated Systolic pressure: 120-129. Diastolic pressure: below 80. Hypertension stage 1 Systolic pressure: 130-139. Diastolic pressure: 80-89. Hypertension stage 2 Systolic pressure: 140 or above. Diastolic pressure:  90 or above. How can this condition affect me? Managing your hypertension is an important responsibility. Over time, hypertension can damage the arteries and decrease blood flow to important parts of the body, including the brain, heart, and kidneys. Having untreated or uncontrolled hypertension can lead to: A heart attack. A stroke. A weakened blood vessel (aneurysm). Heart failure. Kidney damage. Eye damage. Metabolic syndrome. Memory and concentration problems. Vascular dementia. What actions can I take to manage this condition? Hypertension can be managed by making lifestyle changes and possibly by taking medicines. Your health care provider will help you make a plan to bring yourblood pressure within a normal range. Nutrition  Eat a diet that is high in fiber and potassium, and low in salt (sodium), added sugar, and fat. An example eating plan is called the Dietary Approaches to Stop Hypertension (DASH) diet. To eat this way: Eat plenty of fresh fruits and vegetables. Try to fill one-half of your plate at each meal with fruits and vegetables. Eat whole grains, such as whole-wheat pasta, brown rice, or whole-grain bread. Fill about one-fourth of your plate with whole grains. Eat  low-fat dairy products. Avoid fatty cuts of meat, processed or cured meats, and poultry with skin. Fill about one-fourth of your plate with lean proteins such as fish, chicken without skin, beans, eggs, and tofu. Avoid pre-made and processed foods. These tend to be higher in sodium, added sugar, and fat. Reduce your daily sodium intake. Most people with hypertension should eat less than 1,500 mg of sodium a day.  Lifestyle  Work with your health care provider to maintain a healthy body weight or to lose weight. Ask what an ideal weight is for you. Get at least 30 minutes of exercise that causes your heart to beat faster (aerobic exercise) most days of the week. Activities may include walking, swimming, or  biking. Include exercise to strengthen your muscles (resistance exercise), such as weight lifting, as part of your weekly exercise routine. Try to do these types of exercises for 30 minutes at least 3 days a week. Do not use any products that contain nicotine or tobacco, such as cigarettes, e-cigarettes, and chewing tobacco. If you need help quitting, ask your health care provider. Control any long-term (chronic) conditions you have, such as high cholesterol or diabetes. Identify your sources of stress and find ways to manage stress. This may include meditation, deep breathing, or making time for fun activities.  Alcohol use Do not drink alcohol if: Your health care provider tells you not to drink. You are pregnant, may be pregnant, or are planning to become pregnant. If you drink alcohol: Limit how much you use to: 0-1 drink a day for women. 0-2 drinks a day for men. Be aware of how much alcohol is in your drink. In the [place] one drink equals one 12 oz bottle of beer (355 mL), one 5 oz glass of wine (148 mL), or one 1 oz glass of hard liquor (44 mL). Medicines Your health care provider may prescribe medicine if lifestyle changes are not enough to get your blood pressure under control and if: Your systolic blood pressure is 130 or higher. Your diastolic blood pressure is 80 or higher. Take medicines only as told by your health care provider. Follow the directions carefully. Blood pressure medicines must be taken as told by your health care provider. The medicine does not work as well when you skip doses. Skippingdoses also puts you at risk for problems. Monitoring Before you monitor your blood pressure: Do not smoke, drink caffeinated beverages, or exercise within 30 minutes before taking a measurement. Use the bathroom and empty your bladder (urinate). Sit quietly for at least 5 minutes before taking measurements. Monitor your blood pressure at home as told by your health care provider. To  do this: Sit with your back straight and supported. Place your feet flat on the floor. Do not cross your legs. Support your arm on a flat surface, such as a table. Make sure your upper arm is at heart level. Each time you measure, take two or three readings one minute apart and record the results. You may also need to have your blood pressure checked regularly by your healthcare provider. General information Talk with your health care provider about your diet, exercise habits, and other lifestyle factors that may be contributing to hypertension. Review all the medicines you take with your health care provider because there may be side effects or interactions. Keep all visits as told by your health care provider. Your health care provider can help you create and adjust your plan for managing your high blood pressure. Where  to find more information National Heart, Lung, and Blood Institute: PopSteam.is American Heart Association: www.heart.org Contact a health care provider if: You think you are having a reaction to medicines you have taken. You have repeated (recurrent) headaches. You feel dizzy. You have swelling in your ankles. You have trouble with your vision. Get help right away if: You develop a severe headache or confusion. You have unusual weakness or numbness, or you feel faint. You have severe pain in your chest or abdomen. You vomit repeatedly. You have trouble breathing. These symptoms may represent a serious problem that is an emergency. Do not wait to see if the symptoms will go away. Get medical help right away. Call your local emergency services (911 in the [place] Do not drive yourself to the hospital. Summary Hypertension is when the force of blood pumping through your arteries is too strong. If this condition is not controlled, it may put you at risk for serious complications. Your personal target blood pressure may vary depending on your medical conditions, your  age, and other factors. For most people, a normal blood pressure is less than 120/80. Hypertension is managed by lifestyle changes, medicines, or both. Lifestyle changes to help manage hypertension include losing weight, eating a healthy, low-sodium diet, exercising more, stopping smoking, and limiting alcohol. This information is not intended to replace advice given to you by your health care provider. Make sure you discuss any questions you have with your healthcare provider. Document Revised: 01/24/2019 Document Reviewed: 11/19/2018 Elsevier Patient Education  2022 [employer].  [email or web address]">  DASH Eating Plan DASH stands for Dietary Approaches to Stop Hypertension. The DASH eating plan is a healthy eating plan that has been shown to: Reduce high blood pressure (hypertension). Reduce your risk for type 2 diabetes, heart disease, and stroke. Help with weight loss. What are tips for following this plan? Reading food labels Check food labels for the amount of salt (sodium) per serving. Choose foods with less than 5 percent of the Daily Value of sodium. Generally, foods with less than 300 milligrams (mg) of sodium per serving fit into this eating plan. To find whole grains, look for the word "whole" as the first word in the ingredient list. Shopping Buy products labeled as "low-sodium" or "no salt added." Buy fresh foods. Avoid canned foods and pre-made or frozen meals. Cooking Avoid adding salt when cooking. Use salt-free seasonings or herbs instead of table salt or sea salt. Check with your health care provider or pharmacist before using salt substitutes. Do not fry foods. Cook foods using healthy methods such as baking, boiling, grilling, roasting, and broiling instead. Cook with heart-healthy oils, such as olive, canola, avocado, soybean, or sunflower oil. Meal planning  Eat a balanced diet that includes: 4 or more servings of  fruits and 4 or more servings of vegetables each day. Try to fill one-half of your plate with fruits and vegetables. 6-8 servings of whole grains each day. Less than 6 oz (170 g) of lean meat, poultry, or fish each day. A 3-oz (85-g) serving of meat is about the same size as a deck of cards. One egg equals 1 oz (28 g). 2-3 servings of low-fat dairy each day. One serving is 1 cup (237 mL). 1 serving of nuts, seeds, or beans 5 times each week. 2-3 servings of heart-healthy fats. Healthy fats called omega-3 fatty acids are found in foods such as walnuts, flaxseeds, fortified milks, and eggs. These fats are also found in cold-water fish, such as  sardines, salmon, and mackerel. Limit how much you eat of: Canned or prepackaged foods. Food that is high in trans fat, such as some fried foods. Food that is high in saturated fat, such as fatty meat. Desserts and other sweets, sugary drinks, and other foods with added sugar. Full-fat dairy products. Do not salt foods before eating. Do not eat more than 4 egg yolks a week. Try to eat at least 2 vegetarian meals a week. Eat more home-cooked food and less restaurant, buffet, and fast food.  Lifestyle When eating at a restaurant, ask that your food be prepared with less salt or no salt, if possible. If you drink alcohol: Limit how much you use to: 0-1 drink a day for women who are not pregnant. 0-2 drinks a day for men. Be aware of how much alcohol is in your drink. In the [place] one drink equals one 12 oz bottle of beer (355 mL), one 5 oz glass of wine (148 mL), or one 1 oz glass of hard liquor (44 mL). General information Avoid eating more than 2,300 mg of salt a day. If you have hypertension, you may need to reduce your sodium intake to 1,500 mg a day. Work with your health care provider to maintain a healthy body weight or to lose weight. Ask what an ideal weight is for you. Get at least 30 minutes of exercise that causes your heart to beat faster  (aerobic exercise) most days of the week. Activities may include walking, swimming, or biking. Work with your health care provider or dietitian to adjust your eating plan to your individual calorie needs. What foods should I eat? Fruits All fresh, dried, or frozen fruit. Canned fruit in natural juice (without addedsugar). Vegetables Fresh or frozen vegetables (raw, steamed, roasted, or grilled). Low-sodium or reduced-sodium tomato and vegetable juice. Low-sodium or reduced-sodium tomatosauce and tomato paste. Low-sodium or reduced-sodium canned vegetables. Grains Whole-grain or whole-wheat bread. Whole-grain or whole-wheat pasta. Brown rice. Orpah Cobb. Bulgur. Whole-grain and low-sodium cereals. Pita bread.Low-fat, low-sodium crackers. Whole-wheat flour tortillas. Meats and other proteins Skinless chicken or [place]. Ground chicken or [place]. Pork with fat trimmed off. Fish and seafood. Egg whites. Dried beans, peas, or lentils. Unsalted nuts, nut butters, and seeds. Unsalted canned beans. Lean cuts of beef with fat trimmed off. Low-sodium, lean precooked or cured meat, such as sausages or meatloaves. Dairy Low-fat (1%) or fat-free (skim) milk. Reduced-fat, low-fat, or fat-free cheeses. Nonfat, low-sodium ricotta or cottage cheese. Low-fat or nonfatyogurt. Low-fat, low-sodium cheese. Fats and oils Soft margarine without trans fats. Vegetable oil. Reduced-fat, low-fat, or light mayonnaise and salad dressings (reduced-sodium). Canola, safflower, olive, avocado, soybean, andsunflower oils. Avocado. Seasonings and condiments Herbs. Spices. Seasoning mixes without salt. Other foods Unsalted popcorn and pretzels. Fat-free sweets. The items listed above may not be a complete list of foods and beverages you can eat. Contact a dietitian for more information. What foods should I avoid? Fruits Canned fruit in a light or heavy syrup. Fried fruit. Fruit in cream or buttersauce. Vegetables Creamed or  fried vegetables. Vegetables in a cheese sauce. Regular canned vegetables (not low-sodium or reduced-sodium). Regular canned tomato sauce and paste (not low-sodium or reduced-sodium). Regular tomato and vegetable juice(not low-sodium or reduced-sodium). Rosita Fire. Olives. Grains Baked goods made with fat, such as croissants, muffins, or some breads. Drypasta or rice meal packs. Meats and other proteins Fatty cuts of meat. Ribs. Fried meat. Tomasa Blase. Bologna, salami, and other precooked or cured meats, such as sausages or meat loaves.  Fat from the back of a pig (fatback). Bratwurst. Salted nuts and seeds. Canned beans with added salt. Canned orsmoked fish. Whole eggs or egg yolks. Chicken or [place] with skin. Dairy Whole or 2% milk, cream, and half-and-half. Whole or full-fat cream cheese. Whole-fat or sweetened yogurt. Full-fat cheese. Nondairy creamers. Whippedtoppings. Processed cheese and cheese spreads. Fats and oils Butter. Stick margarine. Lard. Shortening. Ghee. Bacon fat. Tropical oils, suchas coconut, palm kernel, or palm oil. Seasonings and condiments Onion salt, garlic salt, seasoned salt, table salt, and sea salt. Worcestershire sauce. Tartar sauce. Barbecue sauce. Teriyaki sauce. Soy sauce, including reduced-sodium. Steak sauce. Canned and packaged gravies. Fish sauce. Oyster sauce. Cocktail sauce. Store-bought horseradish. Ketchup. Mustard. Meat flavorings and tenderizers. Bouillon cubes. Hot sauces. Pre-made or packaged marinades. Pre-made or packaged taco seasonings. Relishes. Regular saladdressings. Other foods Salted popcorn and pretzels. The items listed above may not be a complete list of foods and beverages you should avoid. Contact a dietitian for more information. Where to find more information National Heart, Lung, and Blood Institute: PopSteam.is American Heart Association: www.heart.org Academy of Nutrition and Dietetics: www.eatright.org National Kidney Foundation:  www.kidney.org Summary The DASH eating plan is a healthy eating plan that has been shown to reduce high blood pressure (hypertension). It may also reduce your risk for type 2 diabetes, heart disease, and stroke. When on the DASH eating plan, aim to eat more fresh fruits and vegetables, whole grains, lean proteins, low-fat dairy, and heart-healthy fats. With the DASH eating plan, you should limit salt (sodium) intake to 2,300 mg a day. If you have hypertension, you may need to reduce your sodium intake to 1,500 mg a day. Work with your health care provider or dietitian to adjust your eating plan to your individual calorie needs. This information is not intended to replace advice given to you by your health care provider. Make sure you discuss any questions you have with your healthcare provider. Document Revised: 11/22/2018 Document Reviewed: 11/22/2018 Elsevier Patient Education  2022 [employer].

## 2020-08-11 NOTE — Progress Notes (Signed)
Subjective:    Patient here for follow-up of elevated blood pressure.  He is exercising and is adherent to a low-salt diet.  Blood pressure is well controlled at home. Cardiac symptoms: none. Patient denies: chest pain, chest pressure/discomfort, irregular heart beat, and palpitations. Cardiovascular risk factors: advanced age (older than 72 for men, 21 for women), diabetes mellitus, hypertension, male gender, and obesity (BMI >= 30 kg/m2). Use of agents associated with hypertension: none. History of target organ damage: none.  Patient also presents for follow up of diabetes. Diabetic Review of Systems - medication compliance: compliant most of the time.  Blood sugars ranging 100 - 135 at home. Other symptoms and concerns: none. none    Review of Systems Review of Systems  Constitutional: Negative.   HENT: Negative.    Respiratory: Negative.    Cardiovascular: Negative.   Gastrointestinal: Negative.   Genitourinary: Negative.   Musculoskeletal: Negative.   Skin: Negative.   Neurological: Negative.   Endo/Heme/Allergies: Negative.   Psychiatric/Behavioral: Negative.        Objective:    Physical Exam Constitutional:      General: He is not in acute distress. Cardiovascular:     Rate and Rhythm: Normal rate and regular rhythm.  Pulmonary:     Effort: Pulmonary effort is normal.     Breath sounds: Normal breath sounds.  Skin:    General: Skin is warm and dry.  Neurological:     Mental Status: He is alert and oriented to person, place, and time.  Psychiatric:        Mood and Affect: Mood and affect normal.        Behavior: Behavior normal.        Assessment:    Hypertension, Evidence of target organ damage: none.    Plan:    Dietary sodium restriction. Regular aerobic exercise. Check blood pressures weekly and record. Follow up: 3 months and as needed.

## 2020-11-11 ENCOUNTER — Telehealth (INDEPENDENT_AMBULATORY_CARE_PROVIDER_SITE_OTHER): Payer: Self-pay

## 2020-11-11 ENCOUNTER — Ambulatory Visit (INDEPENDENT_AMBULATORY_CARE_PROVIDER_SITE_OTHER): Payer: 59 | Admitting: Primary Care

## 2020-11-11 ENCOUNTER — Other Ambulatory Visit: Payer: Self-pay

## 2020-11-11 ENCOUNTER — Encounter (INDEPENDENT_AMBULATORY_CARE_PROVIDER_SITE_OTHER): Payer: Self-pay | Admitting: Primary Care

## 2020-11-11 DIAGNOSIS — Z1211 Encounter for screening for malignant neoplasm of colon: Secondary | ICD-10-CM

## 2020-11-11 DIAGNOSIS — E119 Type 2 diabetes mellitus without complications: Secondary | ICD-10-CM

## 2020-11-11 DIAGNOSIS — F5221 Male erectile disorder: Secondary | ICD-10-CM | POA: Diagnosis not present

## 2020-11-11 DIAGNOSIS — I10 Essential (primary) hypertension: Secondary | ICD-10-CM | POA: Diagnosis not present

## 2020-11-11 DIAGNOSIS — E6609 Other obesity due to excess calories: Secondary | ICD-10-CM | POA: Diagnosis not present

## 2020-11-11 DIAGNOSIS — Z6834 Body mass index (BMI) 34.0-34.9, adult: Secondary | ICD-10-CM

## 2020-11-11 LAB — POCT GLYCOSYLATED HEMOGLOBIN (HGB A1C): Hemoglobin A1C: 7.6 % — AB (ref 4.0–5.6)

## 2020-11-11 MED ORDER — ATORVASTATIN CALCIUM 10 MG PO TABS: 10.0000 mg | ORAL_TABLET | Freq: Every day | ORAL | 1 refills | Status: DC

## 2020-11-11 MED ORDER — SILDENAFIL CITRATE 100 MG PO TABS: 50.0000 mg | ORAL_TABLET | Freq: Every day | ORAL | 3 refills | Status: DC | PRN

## 2020-11-11 MED ORDER — JANUMET 50-1000 MG PO TABS: 1.0000 | ORAL_TABLET | Freq: Two times a day (BID) | ORAL | 1 refills | Status: DC

## 2020-11-11 MED ORDER — GLIPIZIDE 10 MG PO TABS: 10.0000 mg | ORAL_TABLET | Freq: Two times a day (BID) | ORAL | 1 refills | Status: DC

## 2020-11-11 MED ORDER — LISINOPRIL-HYDROCHLOROTHIAZIDE 10-12.5 MG PO TABS: 1.0000 | ORAL_TABLET | Freq: Every day | ORAL | 1 refills | Status: DC

## 2020-11-11 NOTE — Progress Notes (Signed)
I connected with  Edward Cabrera on 11/11/20 by a video enabled telemedicine application and verified that I am speaking with the correct person using two identifiers.   I discussed the limitations of evaluation and management by telemedicine. The patient expressed understanding and agreed to proceed.   Patient is at home Weighed himself this am result was 228.8 lbs BP this am without meds 132/92 Does not check glucose regularly

## 2020-11-11 NOTE — Telephone Encounter (Signed)
Copied from CRM [id number]. Topic: General - Other >> Nov 11, 2020  3:04 PM Marylen Ponto wrote: Reason for CRM: Pt stated he went to [facility] to get his prescriptions but none of them were called in. Pt stated he just had an appt and he was told his prescriptions would be sent to his pharmacy. Pt request that the prescriptions be sent asap.

## 2020-11-11 NOTE — Progress Notes (Signed)
[facility]  Telephone Note  I connected with Edward Cabrera, on 11/11/2020 at 10:43 AM telephone and verified that I am speaking with the correct person using two identifiers.   Consent: I discussed the limitations, risks, security and privacy concerns of performing an evaluation and management service by telephone and the availability of in person appointments. I also discussed with the patient that there may be a patient responsible charge related to this service. The patient expressed understanding and agreed to proceed.   Location of Patient: Home will come to office for labs  Location of Provider: [facility]   Persons participating in Telemedicine visit: Salih Williamson Inez Pilgrim,  NP [place] [facility] , CMA  History of Present Illness: [name] is a 60 year old obese male following up for the management of type 2 diabetes and HTN. Current symptoms/problems include none and have been unchanged. He denies shortness of breath, headaches, chest pain or lower extremity edema .Requesting refill for his happy pill.   Current diabetic medications include none.   The patient was initially diagnosed with Type 2 diabetes mellitus based on the following criteria:  ADA guidelines A1C .  Current monitoring regimen: none Any episodes of hypoglycemia? no   Past Medical History:  Diagnosis Date   Diabetes mellitus without complication (HCC)    Gout    Hypertension    Pulmonary embolism (HCC)    Allergies  Allergen Reactions   Iohexol      Code: HIVES, Desc: PT. WAS GIVEN 50 MG BENADRLY IV 30 MINUTES PRE-PROCEDURE W/ NO REACTION-----ARS 10-21-05, Onset Date: [id number]     Current Outpatient Medications on File Prior to Visit  Medication Sig Dispense Refill   amLODipine (NORVASC) 10 MG tablet Take 1 tablet (10 mg total) by mouth daily. 90 tablet 0   lisinopril-hydrochlorothiazide (ZESTORETIC) 10-12.5  MG tablet Take 1 tablet by mouth daily. 90 tablet 0   sildenafil (VIAGRA) 100 MG tablet Take by mouth daily as needed for erectile dysfunction.     acetaminophen (TYLENOL) 325 MG tablet Take 650 mg by mouth every 6 (six) hours as needed for mild pain.     atorvastatin (LIPITOR) 10 MG tablet Take 10 mg by mouth daily. (Patient not taking: No sig reported)     Blood Gluc Meter Disp-Strips (BLOOD GLUCOSE METER DISPOSABLE) DEVI 1 kit by Does not apply route 2 (two) times daily. 1 Bag 0   blood glucose meter kit and supplies Dispense based on patient and insurance preference. Use up to four times daily as directed. (FOR ICD-10 E10.9, E11.9). 1 each 0   glipiZIDE (GLUCOTROL) 10 MG tablet Take 1 tablet (10 mg total) by mouth 2 (two) times daily before a meal. (Patient not taking: No sig reported) 60 tablet 3   sitaGLIPtin-metformin (JANUMET) 50-1000 MG tablet Take 1 tablet by mouth 2 (two) times daily with a meal. (Patient not taking: No sig reported) 180 tablet 1   No current facility-administered medications on file prior to visit.    Observations/Objective: There were no vitals taken for this visit.   Assessment and Plan: Trusten was seen today for diabetes.  Diagnoses and all orders for this visit:  Essential hypertension Counseled on blood pressure goal of less than 130/80, low-sodium, DASH diet, medication compliance, 150 minutes of moderate intensity exercise per week. Discussed medication compliance, adverse effects.  -     CMP14+EGFR  Type 2 diabetes mellitus without complication,  without long-term current use of insulin (HCC) Continues foods that are high in carbohydrates are the following rice, potatoes, breads, sugars, and pastas.  Reduction in the intake (eating) will assist in lowering your blood sugars.  -     CBC with Differential -     Lipid Panel -     POCT glycosylated hemoglobin (Hb A1C)  Class 1 obesity due to excess calories without serious comorbidity with body mass  index (BMI) of 34.0 to 34.9 in adult Obesity is 30-39 indicating an excess in caloric intake or underlining conditions. This may lead to other co-morbidities. Lifestyle modifications of diet and exercise may reduce obesity.   -     Lipid Panel    Follow Up Instructions: 3 months    I discussed the assessment and treatment plan with the patient. The patient was provided an opportunity to ask questions and all were answered. The patient agreed with the plan and demonstrated an understanding of the instructions.   The patient was advised to call back or seek an in-person evaluation if the symptoms worsen or if the condition fails to improve as anticipated.  I provided 15 minutes total of non-face-to-face time during this encounter including median intraservice time, reviewing previous notes, investigations, ordering medications, medical decision making, coordinating care and patient verbalized understanding at the end of the visit.    This note has been created with Surveyor, quantity. Any transcriptional errors are unintentional.   [name], NP 11/11/2020, 10:43 AM

## 2020-11-12 LAB — CBC WITH DIFFERENTIAL/PLATELET
Basophils Absolute: 0 10*3/uL (ref 0.0–0.2)
Basos: 1 %
EOS (ABSOLUTE): 0.1 10*3/uL (ref 0.0–0.4)
Eos: 1 %
Hematocrit: 43.5 % (ref 37.5–51.0)
Hemoglobin: 14.8 g/dL (ref 13.0–17.7)
Immature Grans (Abs): 0 10*3/uL (ref 0.0–0.1)
Immature Granulocytes: 0 %
Lymphocytes Absolute: 2.8 10*3/uL (ref 0.7–3.1)
Lymphs: 46 %
MCH: 28.1 pg (ref 26.6–33.0)
MCHC: 34 g/dL (ref 31.5–35.7)
MCV: 83 fL (ref 79–97)
Monocytes Absolute: 0.4 10*3/uL (ref 0.1–0.9)
Monocytes: 8 %
Neutrophils Absolute: 2.6 10*3/uL (ref 1.4–7.0)
Neutrophils: 44 %
Platelets: 225 10*3/uL (ref 150–450)
RBC: 5.26 x10E6/uL (ref 4.14–5.80)
RDW: 14 % (ref 11.6–15.4)
WBC: 5.9 10*3/uL (ref 3.4–10.8)

## 2020-11-12 LAB — LIPID PANEL
Chol/HDL Ratio: 6.1 ratio — ABNORMAL HIGH (ref 0.0–5.0)
Cholesterol, Total: 233 mg/dL — ABNORMAL HIGH (ref 100–199)
HDL: 38 mg/dL — ABNORMAL LOW (ref >39)
LDL Chol Calc (NIH): 152 mg/dL — ABNORMAL HIGH (ref 0–99)
Triglycerides: 234 mg/dL — ABNORMAL HIGH (ref 0–149)
VLDL Cholesterol Cal: 43 mg/dL — ABNORMAL HIGH (ref 5–40)

## 2020-11-12 LAB — CMP14+EGFR
ALT: 30 IU/L (ref 0–44)
AST: 19 IU/L (ref 0–40)
Albumin/Globulin Ratio: 1.5 (ref 1.2–2.2)
Albumin: 4.3 g/dL (ref 3.8–4.9)
Alkaline Phosphatase: 73 IU/L (ref 44–121)
BUN/Creatinine Ratio: 12 (ref 9–20)
BUN: 17 mg/dL (ref 6–24)
Bilirubin Total: 0.2 mg/dL (ref 0.0–1.2)
CO2: 23 mmol/L (ref 20–29)
Calcium: 9.3 mg/dL (ref 8.7–10.2)
Chloride: 104 mmol/L (ref 96–106)
Creatinine, Ser: 1.45 mg/dL — ABNORMAL HIGH (ref 0.76–1.27)
Globulin, Total: 2.8 g/dL (ref 1.5–4.5)
Glucose: 118 mg/dL — ABNORMAL HIGH (ref 70–99)
Potassium: 4.5 mmol/L (ref 3.5–5.2)
Sodium: 140 mmol/L (ref 134–144)
Total Protein: 7.1 g/dL (ref 6.0–8.5)
eGFR: 56 mL/min/{1.73_m2} — ABNORMAL LOW (ref >59)

## 2020-11-15 ENCOUNTER — Other Ambulatory Visit (INDEPENDENT_AMBULATORY_CARE_PROVIDER_SITE_OTHER): Payer: Self-pay | Admitting: Primary Care

## 2020-11-15 ENCOUNTER — Other Ambulatory Visit: Payer: Self-pay

## 2020-11-15 DIAGNOSIS — E782 Mixed hyperlipidemia: Secondary | ICD-10-CM

## 2020-11-15 MED ORDER — ATORVASTATIN CALCIUM 40 MG PO TABS
40.0000 mg | ORAL_TABLET | Freq: Every day | ORAL | 3 refills | Status: DC
Start: 2020-11-15 — End: 2021-06-09

## 2020-11-17 ENCOUNTER — Ambulatory Visit (INDEPENDENT_AMBULATORY_CARE_PROVIDER_SITE_OTHER): Payer: 59 | Admitting: Primary Care

## 2021-02-09 ENCOUNTER — Other Ambulatory Visit: Payer: Self-pay

## 2021-02-09 ENCOUNTER — Ambulatory Visit (INDEPENDENT_AMBULATORY_CARE_PROVIDER_SITE_OTHER): Payer: 59 | Admitting: Primary Care

## 2021-02-09 ENCOUNTER — Encounter (INDEPENDENT_AMBULATORY_CARE_PROVIDER_SITE_OTHER): Payer: Self-pay | Admitting: Primary Care

## 2021-02-09 VITALS — BP 146/92 | HR 82 | Temp 97.6°F | Ht 68.0 in | Wt 234.0 lb

## 2021-02-09 DIAGNOSIS — F5221 Male erectile disorder: Secondary | ICD-10-CM

## 2021-02-09 DIAGNOSIS — Z23 Encounter for immunization: Secondary | ICD-10-CM

## 2021-02-09 DIAGNOSIS — Z6834 Body mass index (BMI) 34.0-34.9, adult: Secondary | ICD-10-CM

## 2021-02-09 DIAGNOSIS — E119 Type 2 diabetes mellitus without complications: Secondary | ICD-10-CM

## 2021-02-09 DIAGNOSIS — E6609 Other obesity due to excess calories: Secondary | ICD-10-CM

## 2021-02-09 DIAGNOSIS — I10 Essential (primary) hypertension: Secondary | ICD-10-CM

## 2021-02-09 LAB — POCT GLYCOSYLATED HEMOGLOBIN (HGB A1C): Hemoglobin A1C: 8.3 % — AB (ref 4.0–5.6)

## 2021-02-09 MED ORDER — SILDENAFIL CITRATE 100 MG PO TABS: 50.0000 mg | ORAL_TABLET | Freq: Every day | ORAL | 3 refills | Status: DC | PRN

## 2021-02-09 MED ORDER — LISINOPRIL-HYDROCHLOROTHIAZIDE 10-12.5 MG PO TABS: 1.0000 | ORAL_TABLET | Freq: Every day | ORAL | 1 refills | Status: DC

## 2021-02-09 MED ORDER — AMLODIPINE BESYLATE 10 MG PO TABS: 10.0000 mg | ORAL_TABLET | Freq: Every day | ORAL | 1 refills | Status: DC

## 2021-02-09 MED ORDER — GLIPIZIDE ER 10 MG PO TB24: 10.0000 mg | ORAL_TABLET | Freq: Every day | ORAL | 1 refills | Status: DC

## 2021-02-09 MED ORDER — JANUMET 50-1000 MG PO TABS: 1.0000 | ORAL_TABLET | Freq: Two times a day (BID) | ORAL | 1 refills | Status: DC

## 2021-02-09 NOTE — Patient Instructions (Signed)

## 2021-02-09 NOTE — Progress Notes (Signed)
[facility]  Edward Cabrera, is a 61 y.o. male  RKW:[id number]  [id number]  DOB - 1960-05-22  Chief Complaint  Patient presents with   Diabetes       Subjective:   Edward Cabrera is a 61 y.o. male here today for a follow up visit. Patient has No headache, No chest pain, No abdominal pain - No Nausea, No new weakness tingling or numbness, No Cough - SOB. Ate skins bag, saugage abd becan egg biscuitt thgis am No problems updated.  ALLERGIES: Allergies  Allergen Reactions   Iohexol      Code: HIVES, Desc: PT. WAS GIVEN 50 MG BENADRLY IV 30 MINUTES PRE-PROCEDURE W/ NO REACTION-----ARS 10-21-05, Onset Date: [id number]    Prednisone & Diphenhydramine Other (See Comments)    PAST MEDICAL HISTORY: Past Medical History:  Diagnosis Date   Diabetes mellitus without complication (HCC)    Gout    Hypertension    Pulmonary embolism (HCC)     MEDICATIONS AT HOME: Prior to Admission medications   Medication Sig Start Date End Date Taking? Authorizing Provider  acetaminophen (TYLENOL) 325 MG tablet Take 650 mg by mouth every 6 (six) hours as needed for mild pain.   Yes [provider]  amLODipine (NORVASC) 10 MG tablet Take 1 tablet (10 mg total) by mouth daily. 07/29/20  Yes Grayce Sessions, NP  atorvastatin (LIPITOR) 40 MG tablet Take 1 tablet (40 mg total) by mouth daily. 11/15/20  Yes Grayce Sessions, NP  Blood Gluc Meter Disp-Strips (BLOOD GLUCOSE METER DISPOSABLE) DEVI 1 kit by Does not apply route 2 (two) times daily. 05/12/19  Yes Grayce Sessions, NP  blood glucose meter kit and supplies Dispense based on patient and insurance preference. Use up to four times daily as directed. (FOR ICD-10 E10.9, E11.9). 05/12/19  Yes Grayce Sessions, NP  lisinopril-hydrochlorothiazide (ZESTORETIC) 10-12.5 MG tablet Take 1 tablet by mouth daily. 11/11/20  Yes Grayce Sessions, NP  sildenafil (VIAGRA) 100 MG tablet Take 0.5 tablets (50 mg total) by mouth  daily as needed for erectile dysfunction. 11/11/20  Yes Grayce Sessions, NP  sitaGLIPtin-metformin (JANUMET) 50-1000 MG tablet Take 1 tablet by mouth 2 (two) times daily with a meal. 11/11/20  Yes Grayce Sessions, NP  glipiZIDE (GLUCOTROL) 10 MG tablet Take 1 tablet (10 mg total) by mouth 2 (two) times daily before a meal. Patient not taking: Reported on 02/09/2021 11/11/20   Grayce Sessions, NP    Objective:   Vitals:   02/09/21 1056 02/09/21 1109  BP: (!) 149/92 (!) 146/92  Pulse: 79 82  Temp: 97.6 F (36.4 C)   TempSrc: Oral   SpO2: 94%   Weight: 234 lb (106.1 kg)   Height: 5\' 8"  (1.727 m)    Exam General appearance : Awake, alert, not in any distress. Speech Clear. Not toxic looking HEENT: Atraumatic and Normocephalic, pupils equally reactive to light and accomodation Neck: Supple, no JVD. No cervical lymphadenopathy.  Chest: Good air entry bilaterally, no added sounds  CVS: S1 S2 regular, no murmurs.  Abdomen: Bowel sounds present, Non tender and not distended with no gaurding, rigidity or rebound. Extremities: B/L Lower Ext shows no edema, both legs are warm to touch Neurology: Awake alert, and oriented X 3, Non focal Skin: No Rash  Data Review Lab Results  Component Value Date   HGBA1C 8.3 (A) 02/09/2021   HGBA1C 7.6 (A) 11/11/2020   HGBA1C 7.3 (A) 08/11/2020    Assessment &  Plan  Deloyd was seen today for diabetes.  Diagnoses and all orders for this visit:  Type 2 diabetes mellitus without complication, without long-term current use of insulin (HCC) -     HgB A1c 8.3 Monitor foods that are high in carbohydrates are the following rice, potatoes, breads, sugars, and pastas.  Reduction in the intake (eating) will assist in lowering your blood sugars.   sitaGLIPtin-metformin (JANUMET) 50-1000 MG tablet; Take 1 tablet by mouth 2 (two) times daily with a meal. ADDED glucotrol XL $RemoveBefo'10mg'RrcVTdwoaJw$  DAILY  Need for shingles vaccine -     Varicella-zoster vaccine IM  (Shingrix)  Essential hypertension Counseled on blood pressure goal of less than 130/80, low-sodium, DASH diet, medication compliance, 150 minutes of moderate intensity exercise per week. Discussed medication compliance, adverse effects.  -     amLODipine (NORVASC) 10 MG tablet; Take 1 tablet (10 mg total) by mouth daily. -     lisinopril-hydrochlorothiazide (ZESTORETIC) 10-12.5 MG tablet; Take 1 tablet by mouth daily.  Class 1 obesity due to excess calories without serious comorbidity with body mass index (BMI) of 34.0 to 34.9 in adult Obesity is 30-39 indicating an excess in caloric intake or underlining conditions. This may lead to other co-morbidities. Lifestyle modifications of diet and exercise may reduce obesity.  -      Erectile disorder, generalized, mild -     sildenafil (VIAGRA) 100 MG tablet; Take 0.5 tablets (50 mg total) by mouth daily as needed for erectile dysfunction.  Other orders -     glipiZIDE (GLUCOTROL XL) 10 MG 24 hr tablet; Take 1 tablet (10 mg total) by mouth daily with breakfast.     Patient have been counseled extensively about nutrition and exercise. Other issues discussed during this visit include: low cholesterol diet, weight control and daily exercise, foot care, annual eye examinations at [facility], importance of adherence with medications and regular follow-up. We also discussed long term complications of uncontrolled diabetes and hypertension.   No follow-ups on file.  The patient was given clear instructions to go to ER or return to medical center if symptoms don't improve, worsen or new problems develop. The patient verbalized understanding. The patient was told to call to get lab results if they haven't heard anything in the next week.   This note has been created with Surveyor, quantity. Any transcriptional errors are unintentional.   [name], NP 02/09/2021, 11:16 AM

## 2021-02-14 ENCOUNTER — Ambulatory Visit (INDEPENDENT_AMBULATORY_CARE_PROVIDER_SITE_OTHER): Payer: Self-pay

## 2021-02-14 NOTE — Telephone Encounter (Signed)
°  Chief Complaint: neck pain Symptoms: neck and shoulder pain since shingles vaccine given, COVID + cough Frequency: had shingles vaccine on 02/09/21 Pertinent Negatives: Patient denies SOB or chest pain Disposition: [] ED /[] Urgent Care (no appt availability in office) / [] Appointment(In office/virtual)/ []  [facility]/ [] Home Care/ [] Refused Recommended Disposition /[] [facility]/ [x]  Follow-up with PCP Additional Notes: Pt tested positive for COVID today but thought neck and shoulder pain was due to COVID but has been hurting since the vaccine was given but gotten worse. He states he has used heating pad but isnt really helping. Attempted to schedule pt for appt but due to work and was unable. Advised pt care advice and that I would send to provider and f/up with him in the morning.    Reason for Disposition  [1] Over 3 days (72 hours) since shot AND [2] redness, swelling or pain getting worse  Answer Assessment - Initial Assessment Questions 1. COVID-19 DIAGNOSIS: "Who made your COVID-19 diagnosis?" "Was it confirmed by a positive lab test or self-test?" If not diagnosed by a doctor (or NP/PA), ask "Are there lots of cases (community spread) where you live?" Note: See public health department website, if unsure.     Home test today 3. ONSET: "When did the COVID-19 symptoms start?"      Unsure  4. WORST SYMPTOM: "What is your worst symptom?" (e.g., cough, fever, shortness of breath, muscle aches)     No 5. COUGH: "Do you have a cough?" If Yes, ask: "How bad is the cough?"       yes 6. FEVER: "Do you have a fever?" If Yes, ask: "What is your temperature, how was it measured, and when did it start?"     No 7. RESPIRATORY STATUS: "Describe your breathing?" (e.g., shortness of breath, wheezing, unable to speak)      No 9. HIGH RISK DISEASE: "Do you have any chronic medical problems?" (e.g., asthma, heart or lung disease, weak immune system, obesity, etc.)     No 10.  VACCINE: "Have you had the COVID-19 vaccine?" If Yes, ask: "Which one, how many shots, when did you get it?"       yes 11. BOOSTER: "Have you received your COVID-19 booster?" If Yes, ask: "Which one and when did you get it?"       yes 13. OTHER SYMPTOMS: "Do you have any other symptoms?"  (e.g., chills, fatigue, headache, loss of smell or taste, muscle pain, sore throat)       Fatigue  Answer Assessment - Initial Assessment Questions 1. SYMPTOMS: "What is the main symptom?" (e.g., redness, swelling, pain)      Pain in shoulder and neck 2. ONSET: "When was the vaccine (shot) given?" "How much later did the sx begin?" (e.g., hours, days ago)      Last week  3. SEVERITY: "How bad is it?"      moderate 4. FEVER: "Is there a fever?" If Yes, ask: "What is it, how was it measured, and when did it start?"      unsure 5. IMMUNIZATIONS GIVEN: "What shots have you recently received?"     Shingles  6. PAST REACTIONS: "Have you reacted to immunizations before?" If Yes, ask: "What happened?"     NO  Protocols used: Coronavirus (COVID-19) Diagnosed or Suspected-A-AH, Immunization Reactions-A-AH

## 2021-02-15 ENCOUNTER — Telehealth: Payer: Self-pay | Admitting: Family Medicine

## 2021-02-15 DIAGNOSIS — U071 COVID-19: Secondary | ICD-10-CM

## 2021-02-15 MED ORDER — MOLNUPIRAVIR EUA 200MG CAPSULE: 4.0000 | ORAL_CAPSULE | Freq: Two times a day (BID) | ORAL | 0 refills | Status: DC

## 2021-02-15 MED ORDER — BENZONATATE 100 MG PO CAPS: 100.0000 mg | ORAL_CAPSULE | Freq: Two times a day (BID) | ORAL | 0 refills | Status: DC | PRN

## 2021-02-15 MED ORDER — MOLNUPIRAVIR EUA 200MG CAPSULE: 4.0000 | ORAL_CAPSULE | Freq: Two times a day (BID) | ORAL | 0 refills | Status: AC

## 2021-02-15 NOTE — Addendum Note (Signed)
Addended by: Perlie Mayo on: 02/15/2021 12:05 PM   Modules accepted: Orders

## 2021-02-15 NOTE — Telephone Encounter (Signed)
Patient reports he has pain in R side- joints and muscles. Patient has been using heat. Patient was given recommendations for COVID care. Patient states symptoms started Sunday/Monday.Patient scheduled virtual visit-UC for COVID treatment options

## 2021-02-15 NOTE — Progress Notes (Signed)
Virtual Visit Consent   Edward Cabrera, you are scheduled for a virtual visit with a [facility] provider today.     Just as with appointments in the office, your consent must be obtained to participate.  Your consent will be active for this visit and any virtual visit you may have with one of our providers in the next 365 days.     If you have a MyChart account, a copy of this consent can be sent to you electronically.  All virtual visits are billed to your insurance company just like a traditional visit in the office.    As this is a virtual visit, video technology does not allow for your provider to perform a traditional examination.  This may limit your provider's ability to fully assess your condition.  If your provider identifies any concerns that need to be evaluated in person or the need to arrange testing (such as labs, EKG, etc.), we will make arrangements to do so.     Although advances in technology are sophisticated, we cannot ensure that it will always work on either your end or our end.  If the connection with a video visit is poor, the visit may have to be switched to a telephone visit.  With either a video or telephone visit, we are not always able to ensure that we have a secure connection.     I need to obtain your verbal consent now.   Are you willing to proceed with your visit today?    Edward Cabrera has provided verbal consent on 02/15/2021 for a virtual visit (video or telephone).   Perlie Mayo, NP   Date: 02/15/2021 9:16 AM   Virtual Visit via Video Note   I, Perlie Mayo, connected with  Edward Cabrera  ([id number], [birth year]) on 02/15/21 at  9:15 AM EST by a video-enabled telemedicine application and verified that I am speaking with the correct person using two identifiers.  Location: Patient: Virtual Visit Location Patient: Home Provider: Virtual Visit Location Provider: Home Office   I discussed the limitations of evaluation and management by telemedicine  and the availability of in person appointments. The patient expressed understanding and agreed to proceed.    History of Present Illness: Edward Cabrera is a 61 y.o. who identifies as a male who was assigned male at birth, and is being seen today for COVID + , tested last night due to mother testing + yesterday. Symptoms include: pain in neck and shoulder, and pain with breathing deep, cough causes some pain, nasal drainage, sore throat, mild headaches.  Denies shortness of breath, ear pain.   oes have HX of PE- reports breathing is not similar to when he had that and if he does feel it he will go be seen.  Shingle shot last Wednesday.   Vaccinated from covid and flu.  Problems: There are no problems to display for this patient.   Allergies:  Allergies  Allergen Reactions   Iohexol      Code: HIVES, Desc: PT. WAS GIVEN 50 MG BENADRLY IV 30 MINUTES PRE-PROCEDURE W/ NO REACTION-----ARS 10-21-05, Onset Date: [id number]    Prednisone & Diphenhydramine Other (See Comments)   Medications:  Current Outpatient Medications:    acetaminophen (TYLENOL) 325 MG tablet, Take 650 mg by mouth every 6 (six) hours as needed for mild pain., Disp: , Rfl:    amLODipine (NORVASC) 10 MG tablet, Take 1 tablet (10 mg total) by mouth daily., Disp: 90  tablet, Rfl: 1   atorvastatin (LIPITOR) 40 MG tablet, Take 1 tablet (40 mg total) by mouth daily., Disp: 90 tablet, Rfl: 3   Blood Gluc Meter Disp-Strips (BLOOD GLUCOSE METER DISPOSABLE) DEVI, 1 kit by Does not apply route 2 (two) times daily., Disp: 1 Bag, Rfl: 0   blood glucose meter kit and supplies, Dispense based on patient and insurance preference. Use up to four times daily as directed. (FOR ICD-10 E10.9, E11.9)., Disp: 1 each, Rfl: 0   glipiZIDE (GLUCOTROL XL) 10 MG 24 hr tablet, Take 1 tablet (10 mg total) by mouth daily with breakfast., Disp: 90 tablet, Rfl: 1   lisinopril-hydrochlorothiazide (ZESTORETIC) 10-12.5 MG tablet, Take 1 tablet by mouth daily.,  Disp: 90 tablet, Rfl: 1   sildenafil (VIAGRA) 100 MG tablet, Take 0.5 tablets (50 mg total) by mouth daily as needed for erectile dysfunction., Disp: 10 tablet, Rfl: 3   sitaGLIPtin-metformin (JANUMET) 50-1000 MG tablet, Take 1 tablet by mouth 2 (two) times daily with a meal., Disp: 180 tablet, Rfl: 1  Observations/Objective: Patient is well-developed, well-nourished in no acute distress.  Resting comfortably  at home.  Head is normocephalic, atraumatic.  No labored breathing.  Speech is clear and coherent with logical content.  Patient is alert and oriented at baseline.    Assessment and Plan:  1. COVID-19 Visual proof of COVID test Will do Lagevrio needs updated labs, but unable to get right now. Perles for cough OTC and other info on AVS  Work note for two days, advised he need to contact PCP for extended note for the 5 days quarantine  No red flags today, does have HX of PE- reports breathing is not similar to when he had that and if he does feel it he will go be seen.   Reviewed side effects, risks and benefits of medication.     Patient acknowledged agreement and understanding of the plan.   I discussed the assessment and treatment plan with the patient. The patient was provided an opportunity to ask questions and all were answered. The patient agreed with the plan and demonstrated an understanding of the instructions.   The patient was advised to call back or seek an in-person evaluation if the symptoms worsen or if the condition fails to improve as anticipated.   The above assessment and management plan was discussed with the patient. The patient verbalized understanding of and has agreed to the management plan. Patient is aware to call the clinic if symptoms persist or worsen. Patient is aware when to return to the clinic for a follow-up visit. Patient educated on when it is appropriate to go to the emergency department. .   - molnupiravir EUA (LAGEVRIO) 200 mg CAPS  capsule; Take 4 capsules (800 mg total) by mouth 2 (two) times daily for 5 days.  Dispense: 40 capsule; Refill: 0 - benzonatate (TESSALON) 100 MG capsule; Take 1 capsule (100 mg total) by mouth 2 (two) times daily as needed for cough.  Dispense: 20 capsule; Refill: 0   Follow Up Instructions: I discussed the assessment and treatment plan with the patient. The patient was provided an opportunity to ask questions and all were answered. The patient agreed with the plan and demonstrated an understanding of the instructions.  A copy of instructions were sent to the patient via MyChart unless otherwise noted below.     The patient was advised to call back or seek an in-person evaluation if the symptoms worsen or if the condition fails to  improve as anticipated.  Time:  I spent 10 minutes with the patient via telehealth technology discussing the above problems/concerns.    Perlie Mayo, NP

## 2021-02-15 NOTE — Patient Instructions (Signed)
Please keep well-hydrated and get plenty of rest.  You can do the following if you desire. Start a saline nasal rinse to flush out your nasal passages. You can use plain Mucinex to help thin congestion. If you have a humidifier, running in the bedroom at night. I want you to start OTC vitamin D3 1000 units daily, vitamin C 1000 mg daily, and a zinc supplement. Please take prescribed medications as directed.  You have been enrolled in a MyChart symptom monitoring program. Please answer these questions daily so we can keep track of how you are doing.  You were to quarantine for 5 days from onset of your symptoms.  After day 5, if you have had no fever and you are feeling better, you can end quarantine but need to mask for an additional 5 days. After day 5 if you have a fever or are having significant symptoms, please quarantine for full 10 days.  If you note any worsening of symptoms, any significant shortness of breath or any chest pain, please seek ER evaluation ASAP.  Please do not delay care!  COVID-19: What to Do if You Are Sick If you test positive and are an older adult or someone who is at high risk of getting very sick from COVID-19, treatment may be available. Contact a healthcare provider right away after a positive test to determine if you are eligible, even if your symptoms are mild right now. You can also visit a Test to Treat location and, if eligible, receive a prescription from a provider. Don't delay: Treatment must be started within the first few days to be effective. If you have a fever, cough, or other symptoms, you might have COVID-19. Most people have mild illness and are able to recover at home. If you are sick: Keep track of your symptoms. If you have an emergency warning sign (including trouble breathing), call 911. Steps to help prevent the spread of COVID-19 if you are sick If you are sick with COVID-19 or think you might have COVID-19, follow the steps below to  care for yourself and to help protect other people in your home and community. Stay home except to get medical care Stay home. Most people with COVID-19 have mild illness and can recover at home without medical care. Do not leave your home, except to get medical care. Do not visit public areas and do not go to places where you are unable to wear a mask. Take care of yourself. Get rest and stay hydrated. Take over-the-counter medicines, such as acetaminophen, to help you feel better. Stay in touch with your doctor. Call before you get medical care. Be sure to get care if you have trouble breathing, or have any other emergency warning signs, or if you think it is an emergency. Avoid public transportation, ride-sharing, or taxis if possible. Get tested If you have symptoms of COVID-19, get tested. While waiting for test results, stay away from others, including staying apart from those living in your household. Get tested as soon as possible after your symptoms start. Treatments may be available for people with COVID-19 who are at risk for becoming very sick. Don't delay: Treatment must be started early to be effective--some treatments must begin within 5 days of your first symptoms. Contact your healthcare provider right away if your test result is positive to determine if you are eligible. Self-tests are one of several options for testing for the virus that causes COVID-19 and may be more convenient than laboratory-based  tests and point-of-care tests. Ask your healthcare provider or your local health department if you need help interpreting your test results. You can visit your state, tribal, local, and territorial health department's website to look for the latest local information on testing sites. Separate yourself from other people As much as possible, stay in a specific room and away from other people and pets in your home. If possible, you should use a separate bathroom. If you need to be around  other people or animals in or outside of the home, wear a well-fitting mask. Tell your close contacts that they may have been exposed to COVID-19. An infected person can spread COVID-19 starting 48 hours (or 2 days) before the person has any symptoms or tests positive. By letting your close contacts know they may have been exposed to COVID-19, you are helping to protect everyone. See COVID-19 and Animals if you have questions about pets. If you are diagnosed with COVID-19, someone from the health department may call you. Answer the call to slow the spread. Monitor your symptoms Symptoms of COVID-19 include fever, cough, or other symptoms. Follow care instructions from your healthcare provider and local health department. Your local health authorities may give instructions on checking your symptoms and reporting information. When to seek emergency medical attention Look for emergency warning signs* for COVID-19. If someone is showing any of these signs, seek emergency medical care immediately: Trouble breathing Persistent pain or pressure in the chest New confusion Inability to wake or stay awake Pale, [place], or blue-colored skin, lips, or nail beds, depending on skin tone *This list is not all possible symptoms. Please call your medical provider for any other symptoms that are severe or concerning to you. Call 911 or call ahead to your local emergency facility: Notify the operator that you are seeking care for someone who has or may have COVID-19. Call ahead before visiting your doctor Call ahead. Many medical visits for routine care are being postponed or done by phone or telemedicine. If you have a medical appointment that cannot be postponed, call your doctor's office, and tell them you have or may have COVID-19. This will help the office protect themselves and other patients. If you are sick, wear a well-fitting mask You should wear a mask if you must be around other people or animals,  including pets (even at home). Wear a mask with the best fit, protection, and comfort for you. You don't need to wear the mask if you are alone. If you can't put on a mask (because of trouble breathing, for example), cover your coughs and sneezes in some other way. Try to stay at least 6 feet away from other people. This will help protect the people around you. Masks should not be placed on young children under age 40 years, anyone who has trouble breathing, or anyone who is not able to remove the mask without help. Cover your coughs and sneezes Cover your mouth and nose with a tissue when you cough or sneeze. Throw away used tissues in a lined trash can. Immediately wash your hands with soap and water for at least 20 seconds. If soap and water are not available, clean your hands with an alcohol-based hand sanitizer that contains at least 60% alcohol. Clean your hands often Wash your hands often with soap and water for at least 20 seconds. This is especially important after blowing your nose, coughing, or sneezing; going to the bathroom; and before eating or preparing food. Use hand sanitizer  if soap and water are not available. Use an alcohol-based hand sanitizer with at least 60% alcohol, covering all surfaces of your hands and rubbing them together until they feel dry. Soap and water are the best option, especially if hands are visibly dirty. Avoid touching your eyes, nose, and mouth with unwashed hands. Handwashing Tips Avoid sharing personal household items Do not share dishes, drinking glasses, cups, eating utensils, towels, or bedding with other people in your home. Wash these items thoroughly after using them with soap and water or put in the dishwasher. Clean surfaces in your home regularly Clean and disinfect high-touch surfaces (for example, doorknobs, tables, handles, light switches, and countertops) in your "sick room" and bathroom. In shared spaces, you should clean and disinfect  surfaces and items after each use by the person who is ill. If you are sick and cannot clean, a caregiver or other person should only clean and disinfect the area around you (such as your bedroom and bathroom) on an as needed basis. Your caregiver/other person should wait as long as possible (at least several hours) and wear a mask before entering, cleaning, and disinfecting shared spaces that you use. Clean and disinfect areas that may have blood, stool, or body fluids on them. Use household cleaners and disinfectants. Clean visible dirty surfaces with household cleaners containing soap or detergent. Then, use a household disinfectant. Use a product from [employer] List N: Disinfectants for Coronavirus (COVID-19). Be sure to follow the instructions on the label to ensure safe and effective use of the product. Many products recommend keeping the surface wet with a disinfectant for a certain period of time (look at "contact time" on the product label). You may also need to wear personal protective equipment, such as gloves, depending on the directions on the product label. Immediately after disinfecting, wash your hands with soap and water for 20 seconds. For completed guidance on cleaning and disinfecting your home, visit Complete Disinfection Guidance. Take steps to improve ventilation at home Improve ventilation (air flow) at home to help prevent from spreading COVID-19 to other people in your household. Clear out COVID-19 virus particles in the air by opening windows, using air filters, and turning on fans in your home. Use this interactive tool to learn how to improve air flow in your home. When you can be around others after being sick with COVID-19 Deciding when you can be around others is different for different situations. Find out when you can safely end home isolation. For any additional questions about your care, contact your healthcare provider or state or local health  department. 03/23/2020 Content source: [facility] for Immunization and Respiratory Diseases ([facility]), Division of Viral Diseases This information is not intended to replace advice given to you by your health care provider. Make sure you discuss any questions you have with your health care provider. Document Revised: 05/06/2020 Document Reviewed: 05/06/2020 Elsevier Patient Education  2022 [employer].

## 2021-02-16 ENCOUNTER — Encounter: Payer: Self-pay | Admitting: Family Medicine

## 2021-02-16 NOTE — Telephone Encounter (Unsigned)
Copied from CRM [id number]. Topic: General - Other >> Feb 15, 2021  9:27 AM Gaetana Michaelis A wrote: Reason for CRM: The patient has been directed to quarantine for due to testing positive for COVID   The patient has been directed to stay out of work for the remainder of the week and has called to request a note from the practice stating that, to give to their boss  Please contact further when possible

## 2021-02-16 NOTE — Telephone Encounter (Signed)
Patient had a virtual visit on yesterday.

## 2021-05-11 ENCOUNTER — Encounter (INDEPENDENT_AMBULATORY_CARE_PROVIDER_SITE_OTHER): Payer: Self-pay | Admitting: Primary Care

## 2021-05-11 ENCOUNTER — Telehealth (INDEPENDENT_AMBULATORY_CARE_PROVIDER_SITE_OTHER): Payer: Self-pay | Admitting: Primary Care

## 2021-05-11 DIAGNOSIS — F5221 Male erectile disorder: Secondary | ICD-10-CM

## 2021-05-11 DIAGNOSIS — E119 Type 2 diabetes mellitus without complications: Secondary | ICD-10-CM

## 2021-05-11 DIAGNOSIS — I10 Essential (primary) hypertension: Secondary | ICD-10-CM

## 2021-05-11 DIAGNOSIS — E782 Mixed hyperlipidemia: Secondary | ICD-10-CM

## 2021-05-11 NOTE — Progress Notes (Signed)
Telephone Note ? ?I connected with Edward Cabrera on 05/11/21 at  9:10 AM EDT by telephone and verified that I am speaking with the correct person using two identifiers. ? ?Location: ?Patient: home ?Provider: [facility] ?  ?I discussed the limitations, risks, security and privacy concerns of performing an evaluation and management service by telephone and the availability of in person appointments. I also discussed with the patient that there may be a patient responsible charge related to this service. The patient expressed understanding and agreed to proceed. ? ? ?History of Present Illness: ?Edward Cabrera is a 61 y.o. male here today for a follow up visit for the management of HTN and T2D. He endorses taking all his medication as prescribed and doing well. Patient has No headache, No chest pain, No abdominal pain - No Nausea, No new weakness tingling or numbness, No Cough - SOB ?  ?Observations/Objective: ?Comprehensive ROS Pertinent positive and negative noted in HPI   ? ?Assessment and Plan: ?Diagnoses and all orders for this visit: ? ?Type 2 diabetes mellitus without complication, without long-term current use of insulin (HCC) ?Will need in person visit for labs Monitor foods that are high in carbohydrates are the following rice, potatoes, breads, sugars, and pastas.  Reduction in the intake (eating) will assist in lowering your blood sugars.  ? ?Essential hypertension ?BP goal - < 130/80 ?Explained that having normal blood pressure is the goal and medications are helping to get to goal and maintain normal blood pressure. ?DIET: Limit salt intake, read nutrition labels to check salt content, limit fried and high fatty foods  ?Avoid using multisymptom OTC cold preparations that generally contain sudafed which can rise BP. Consult with pharmacist on best cold relief products to use for persons with HTN ?EXERCISE ?Discussed incorporating exercise such as walking - 30 minutes most days of the week and can  do in 10 minute intervals    ? ?Mixed hyperlipidemia ? Healthy lifestyle diet of fruits vegetables fish nuts whole grains and low saturated fat . Foods high in cholesterol or liver, fatty meats,cheese, butter avocados, nuts and seeds, chocolate and fried foods. ? ?Erectile disorder, generalized, mild ? Currently taking Viagra  ? ?Follow Up Instructions: ? ?  ?I discussed the assessment and treatment plan with the patient. The patient was provided an opportunity to ask questions and all were answered. The patient agreed with the plan and demonstrated an understanding of the instructions. ?  ?The patient was advised to call back or seek an in-person evaluation if the symptoms worsen or if the condition fails to improve as anticipated. ? ?I provided 30 minutes of non-face-to-face time during this encounter. ? ? ?Grayce Sessions, NP ? ? ?

## 2021-05-18 ENCOUNTER — Telehealth (INDEPENDENT_AMBULATORY_CARE_PROVIDER_SITE_OTHER): Payer: Self-pay | Admitting: Primary Care

## 2021-05-18 NOTE — Telephone Encounter (Signed)
Copied from CRM [id number]. Topic: General - Inquiry ?>> May 18, 2021  8:51 AM Aretta Nip wrote: ?Reason for CRM: Pt was in the office on 5/10 and was told he states that he should come back for lab work and he thought he had appt today, pt does not have appt for today and labs not ordered. Pt also states that he had requested his meds be refill at the appt and states they were not. Pt states that at the time of appt that dr was by herself and is thinking that is why and if someone could pls contact him @ [phone] Tried to reach office with no answer/ Pt seemed understanding. ?

## 2021-05-18 NOTE — Telephone Encounter (Signed)
Left message asking patient to return call to office

## 2021-05-19 ENCOUNTER — Other Ambulatory Visit (INDEPENDENT_AMBULATORY_CARE_PROVIDER_SITE_OTHER): Payer: Self-pay | Admitting: Primary Care

## 2021-05-19 DIAGNOSIS — I10 Essential (primary) hypertension: Secondary | ICD-10-CM

## 2021-05-19 DIAGNOSIS — F5221 Male erectile disorder: Secondary | ICD-10-CM

## 2021-05-19 MED ORDER — AMLODIPINE BESYLATE 10 MG PO TABS: 10.0000 mg | ORAL_TABLET | Freq: Every day | ORAL | 1 refills | Status: DC

## 2021-05-19 NOTE — Telephone Encounter (Signed)
Medication Refill - Medication: lisinopril-hydrochlorothiazide (ZESTORETIC) 10-12.5 MG tablet,amLODipine (NORVASC) 10 MG tablet sildenafil (VIAGRA) 100 MG tablet  Has the patient contacted their pharmacy? No. No more refills.  (Agent: If no, request that the patient contact the pharmacy for the refill. If patient does not wish to contact the pharmacy document the reason why and proceed with request.)  [place] ([place] - [address]  [address] [place] ([place]) [place] [address]  Phone: [phone] Fax: [phone]  Hours: Not open 24 hours   Preferred Pharmacy (with phone number or street name):  Has the patient been seen for an appointment in the last year OR does the patient have an upcoming appointment? Yes.    Agent: Please be advised that RX refills may take up to 3 business days. We ask that you follow-up with your pharmacy.

## 2021-05-19 NOTE — Telephone Encounter (Signed)
Requested medication (s) are due for refill today: yes  Requested medication (s) are on the active medication list: yes  Last refill:  zestoretic- 02/09/21 #90 1 refill, viagra 02/09/21 #10 3 refills  Future visit scheduled: no last seen 1 week ago   Notes to clinic:  failed labs 11/11/20. Last BP 146/92 on 02/09/21. Do you want to refill Rx?     Requested Prescriptions  Pending Prescriptions Disp Refills   lisinopril-hydrochlorothiazide (ZESTORETIC) 10-12.5 MG tablet 90 tablet 1    Sig: Take 1 tablet by mouth daily.     Cardiovascular:  ACEI + Diuretic Combos Failed - 05/19/2021  1:06 PM      Failed - Na in normal range and within 180 days    Sodium  Date Value Ref Range Status  11/11/2020 140 134 - 144 mmol/L Final         Failed - K in normal range and within 180 days    Potassium  Date Value Ref Range Status  11/11/2020 4.5 3.5 - 5.2 mmol/L Final         Failed - Cr in normal range and within 180 days    Creatinine, Ser  Date Value Ref Range Status  11/11/2020 1.45 (H) 0.76 - 1.27 mg/dL Final         Failed - eGFR is 30 or above and within 180 days    GFR calc Af Amer  Date Value Ref Range Status  04/21/2019 73 >59 mL/min/1.73 Final   GFR calc non Af Amer  Date Value Ref Range Status  04/21/2019 63 >59 mL/min/1.73 Final   eGFR  Date Value Ref Range Status  11/11/2020 56 (L) >59 mL/min/1.73 Final         Failed - Last BP in normal range    BP Readings from Last 1 Encounters:  02/09/21 (!) 146/92         Passed - Patient is not pregnant      Passed - Valid encounter within last 6 months    Recent Outpatient Visits           1 week ago Type 2 diabetes mellitus without complication, without long-term current use of insulin ([place])   [place] [facility] Juluis Mire P, NP   3 months ago Type 2 diabetes mellitus without complication, without long-term current use of insulin ([place])   [place] [facility] [name], NP    6 months ago Essential hypertension   [place], Michelle P, NP   9 months ago Type 2 diabetes mellitus without complication, without long-term current use of insulin ([place])   [place] Fenton Foy, NP   2 years ago Screening for diabetes mellitus   [facility] Juluis Mire P, NP                sildenafil (VIAGRA) 100 MG tablet 10 tablet 3    Sig: Take 0.5 tablets (50 mg total) by mouth daily as needed for erectile dysfunction.     Urology: Erectile Dysfunction Agents Failed - 05/19/2021  1:06 PM      Failed - Last BP in normal range    BP Readings from Last 1 Encounters:  02/09/21 (!) 146/92         Passed - AST in normal range and within 360 days    AST  Date Value Ref Range Status  11/11/2020 19 0 - 40  IU/L Final         Passed - ALT in normal range and within 360 days    ALT  Date Value Ref Range Status  11/11/2020 30 0 - 44 IU/L Final         Passed - Valid encounter within last 12 months    Recent Outpatient Visits           1 week ago Type 2 diabetes mellitus without complication, without long-term current use of insulin ([place])   [place] Juluis Mire P, NP   3 months ago Type 2 diabetes mellitus without complication, without long-term current use of insulin ([place])   [place] Juluis Mire P, NP   6 months ago Essential hypertension   [place], Michelle P, NP   9 months ago Type 2 diabetes mellitus without complication, without long-term current use of insulin ([place])   [place] Fenton Foy, NP   2 years ago Screening for diabetes mellitus   [facility] [name], NP               Signed Prescriptions Disp Refills   amLODipine (NORVASC) 10 MG tablet 90 tablet 1    Sig: Take 1 tablet (10 mg total) by mouth  daily.     Cardiovascular: Calcium Channel Blockers 2 Failed - 05/19/2021  1:06 PM      Failed - Last BP in normal range    BP Readings from Last 1 Encounters:  02/09/21 (!) 146/92         Passed - Last Heart Rate in normal range    Pulse Readings from Last 1 Encounters:  02/09/21 82         Passed - Valid encounter within last 6 months    Recent Outpatient Visits           1 week ago Type 2 diabetes mellitus without complication, without long-term current use of insulin ([place])   [place] [facility] Juluis Mire P, NP   3 months ago Type 2 diabetes mellitus without complication, without long-term current use of insulin ([place])   [place], Michelle P, NP   6 months ago Essential hypertension   [place], Michelle P, NP   9 months ago Type 2 diabetes mellitus without complication, without long-term current use of insulin ([place])   [place] Fenton Foy, NP   2 years ago Screening for diabetes mellitus   [place] [name], NP

## 2021-05-19 NOTE — Telephone Encounter (Signed)
Requested Prescriptions  Pending Prescriptions Disp Refills  . lisinopril-hydrochlorothiazide (ZESTORETIC) 10-12.5 MG tablet 90 tablet 1    Sig: Take 1 tablet by mouth daily.     Cardiovascular:  ACEI + Diuretic Combos Failed - 05/19/2021  1:06 PM      Failed - Na in normal range and within 180 days    Sodium  Date Value Ref Range Status  11/11/2020 140 134 - 144 mmol/L Final         Failed - K in normal range and within 180 days    Potassium  Date Value Ref Range Status  11/11/2020 4.5 3.5 - 5.2 mmol/L Final         Failed - Cr in normal range and within 180 days    Creatinine, Ser  Date Value Ref Range Status  11/11/2020 1.45 (H) 0.76 - 1.27 mg/dL Final         Failed - eGFR is 30 or above and within 180 days    GFR calc Af Amer  Date Value Ref Range Status  04/21/2019 73 >59 mL/min/1.73 Final   GFR calc non Af Amer  Date Value Ref Range Status  04/21/2019 63 >59 mL/min/1.73 Final   eGFR  Date Value Ref Range Status  11/11/2020 56 (L) >59 mL/min/1.73 Final         Failed - Last BP in normal range    BP Readings from Last 1 Encounters:  02/09/21 (!) 146/92         Passed - Patient is not pregnant      Passed - Valid encounter within last 6 months    Recent Outpatient Visits          1 week ago Type 2 diabetes mellitus without complication, without long-term current use of insulin ([place])   [place] [facility] Juluis Mire P, NP   3 months ago Type 2 diabetes mellitus without complication, without long-term current use of insulin ([place])   [place] [facility] [name], NP   6 months ago Essential hypertension   [place], Michelle P, NP   9 months ago Type 2 diabetes mellitus without complication, without long-term current use of insulin ([place])   [place] Fenton Foy, NP   2 years ago Screening for diabetes mellitus   [facility]  Juluis Mire P, NP             . sildenafil (VIAGRA) 100 MG tablet 10 tablet 3    Sig: Take 0.5 tablets (50 mg total) by mouth daily as needed for erectile dysfunction.     Urology: Erectile Dysfunction Agents Failed - 05/19/2021  1:06 PM      Failed - Last BP in normal range    BP Readings from Last 1 Encounters:  02/09/21 (!) 146/92         Passed - AST in normal range and within 360 days    AST  Date Value Ref Range Status  11/11/2020 19 0 - 40 IU/L Final         Passed - ALT in normal range and within 360 days    ALT  Date Value Ref Range Status  11/11/2020 30 0 - 44 IU/L Final         Passed - Valid encounter within last 12 months    Recent Outpatient Visits          1 week  ago Type 2 diabetes mellitus without complication, without long-term current use of insulin ([place])   [place] [facility] Juluis Mire P, NP   3 months ago Type 2 diabetes mellitus without complication, without long-term current use of insulin ([place])   [place] [name], NP   6 months ago Essential hypertension   [place], Michelle P, NP   9 months ago Type 2 diabetes mellitus without complication, without long-term current use of insulin ([place])   [place] Fenton Foy, NP   2 years ago Screening for diabetes mellitus   [facility] Juluis Mire P, NP             . amLODipine (NORVASC) 10 MG tablet 90 tablet 1    Sig: Take 1 tablet (10 mg total) by mouth daily.     Cardiovascular: Calcium Channel Blockers 2 Failed - 05/19/2021  1:06 PM      Failed - Last BP in normal range    BP Readings from Last 1 Encounters:  02/09/21 (!) 146/92         Passed - Last Heart Rate in normal range    Pulse Readings from Last 1 Encounters:  02/09/21 82         Passed - Valid encounter within last 6 months    Recent Outpatient Visits          1 week  ago Type 2 diabetes mellitus without complication, without long-term current use of insulin ([place])   [place] [facility] Juluis Mire P, NP   3 months ago Type 2 diabetes mellitus without complication, without long-term current use of insulin ([place])   [place], Michelle P, NP   6 months ago Essential hypertension   [place], Michelle P, NP   9 months ago Type 2 diabetes mellitus without complication, without long-term current use of insulin ([place])   [place] Fenton Foy, NP   2 years ago Screening for diabetes mellitus   [place] [name], NP

## 2021-05-19 NOTE — Telephone Encounter (Signed)
Routed to PCP 

## 2021-05-19 NOTE — Telephone Encounter (Signed)
Pt is returning the call and requesting a call back from [place].   [phone]

## 2021-05-20 NOTE — Telephone Encounter (Signed)
Does this patient need labs? Medication refill request has been routed as well.

## 2021-05-21 ENCOUNTER — Other Ambulatory Visit (INDEPENDENT_AMBULATORY_CARE_PROVIDER_SITE_OTHER): Payer: Self-pay | Admitting: Primary Care

## 2021-05-21 DIAGNOSIS — F5221 Male erectile disorder: Secondary | ICD-10-CM

## 2021-05-23 NOTE — Telephone Encounter (Signed)
Routed to PCP 

## 2021-05-24 ENCOUNTER — Other Ambulatory Visit (INDEPENDENT_AMBULATORY_CARE_PROVIDER_SITE_OTHER): Payer: Self-pay | Admitting: Primary Care

## 2021-05-24 MED ORDER — SILDENAFIL CITRATE 100 MG PO TABS: 50.0000 mg | ORAL_TABLET | Freq: Every day | ORAL | 3 refills | Status: DC | PRN

## 2021-05-24 MED ORDER — LISINOPRIL-HYDROCHLOROTHIAZIDE 10-12.5 MG PO TABS: 1.0000 | ORAL_TABLET | Freq: Every day | ORAL | 1 refills | Status: DC

## 2021-05-25 ENCOUNTER — Other Ambulatory Visit (INDEPENDENT_AMBULATORY_CARE_PROVIDER_SITE_OTHER): Payer: Self-pay | Admitting: Primary Care

## 2021-05-25 DIAGNOSIS — I10 Essential (primary) hypertension: Secondary | ICD-10-CM

## 2021-05-25 DIAGNOSIS — E119 Type 2 diabetes mellitus without complications: Secondary | ICD-10-CM

## 2021-05-25 DIAGNOSIS — Z125 Encounter for screening for malignant neoplasm of prostate: Secondary | ICD-10-CM

## 2021-05-25 DIAGNOSIS — E782 Mixed hyperlipidemia: Secondary | ICD-10-CM

## 2021-05-26 NOTE — Telephone Encounter (Signed)
Patient aware that medications have been sent and labs ordered. Will come in for labs on 5/31

## 2021-06-01 ENCOUNTER — Other Ambulatory Visit (INDEPENDENT_AMBULATORY_CARE_PROVIDER_SITE_OTHER): Payer: Self-pay

## 2021-06-08 ENCOUNTER — Other Ambulatory Visit (INDEPENDENT_AMBULATORY_CARE_PROVIDER_SITE_OTHER): Payer: Self-pay

## 2021-06-08 DIAGNOSIS — Z125 Encounter for screening for malignant neoplasm of prostate: Secondary | ICD-10-CM

## 2021-06-08 DIAGNOSIS — E782 Mixed hyperlipidemia: Secondary | ICD-10-CM

## 2021-06-08 DIAGNOSIS — E119 Type 2 diabetes mellitus without complications: Secondary | ICD-10-CM

## 2021-06-08 DIAGNOSIS — I10 Essential (primary) hypertension: Secondary | ICD-10-CM

## 2021-06-09 ENCOUNTER — Other Ambulatory Visit (INDEPENDENT_AMBULATORY_CARE_PROVIDER_SITE_OTHER): Payer: Self-pay | Admitting: Primary Care

## 2021-06-09 DIAGNOSIS — E782 Mixed hyperlipidemia: Secondary | ICD-10-CM

## 2021-06-09 LAB — CMP14+EGFR
ALT: 37 IU/L (ref 0–44)
AST: 30 IU/L (ref 0–40)
Albumin/Globulin Ratio: 1.8 (ref 1.2–2.2)
Albumin: 4.4 g/dL (ref 3.8–4.9)
Alkaline Phosphatase: 82 IU/L (ref 44–121)
BUN/Creatinine Ratio: 7 — ABNORMAL LOW (ref 9–20)
BUN: 10 mg/dL (ref 6–24)
Bilirubin Total: 0.3 mg/dL (ref 0.0–1.2)
CO2: 23 mmol/L (ref 20–29)
Calcium: 9.3 mg/dL (ref 8.7–10.2)
Chloride: 109 mmol/L — ABNORMAL HIGH (ref 96–106)
Creatinine, Ser: 1.4 mg/dL — ABNORMAL HIGH (ref 0.76–1.27)
Globulin, Total: 2.5 g/dL (ref 1.5–4.5)
Glucose: 178 mg/dL — ABNORMAL HIGH (ref 70–99)
Potassium: 4.2 mmol/L (ref 3.5–5.2)
Sodium: 144 mmol/L (ref 134–144)
Total Protein: 6.9 g/dL (ref 6.0–8.5)
eGFR: 58 mL/min/{1.73_m2} — ABNORMAL LOW (ref >59)

## 2021-06-09 LAB — CBC WITH DIFFERENTIAL/PLATELET
Basophils Absolute: 0 10*3/uL (ref 0.0–0.2)
Basos: 0 %
EOS (ABSOLUTE): 0.1 10*3/uL (ref 0.0–0.4)
Eos: 2 %
Hematocrit: 43.5 % (ref 37.5–51.0)
Hemoglobin: 14.5 g/dL (ref 13.0–17.7)
Immature Grans (Abs): 0 10*3/uL (ref 0.0–0.1)
Immature Granulocytes: 0 %
Lymphocytes Absolute: 2.6 10*3/uL (ref 0.7–3.1)
Lymphs: 49 %
MCH: 27.5 pg (ref 26.6–33.0)
MCHC: 33.3 g/dL (ref 31.5–35.7)
MCV: 83 fL (ref 79–97)
Monocytes Absolute: 0.4 10*3/uL (ref 0.1–0.9)
Monocytes: 8 %
Neutrophils Absolute: 2.1 10*3/uL (ref 1.4–7.0)
Neutrophils: 41 %
Platelets: 250 10*3/uL (ref 150–450)
RBC: 5.27 x10E6/uL (ref 4.14–5.80)
RDW: 13.7 % (ref 11.6–15.4)
WBC: 5.3 10*3/uL (ref 3.4–10.8)

## 2021-06-09 LAB — PSA: Prostate Specific Ag, Serum: 1.1 ng/mL (ref 0.0–4.0)

## 2021-06-09 LAB — LIPID PANEL
Chol/HDL Ratio: 5.5 ratio — ABNORMAL HIGH (ref 0.0–5.0)
Cholesterol, Total: 208 mg/dL — ABNORMAL HIGH (ref 100–199)
HDL: 38 mg/dL — ABNORMAL LOW (ref >39)
LDL Chol Calc (NIH): 153 mg/dL — ABNORMAL HIGH (ref 0–99)
Triglycerides: 94 mg/dL (ref 0–149)
VLDL Cholesterol Cal: 17 mg/dL (ref 5–40)

## 2021-06-09 MED ORDER — ATORVASTATIN CALCIUM 40 MG PO TABS: 40.0000 mg | ORAL_TABLET | Freq: Every day | ORAL | 3 refills | Status: DC

## 2021-10-10 ENCOUNTER — Ambulatory Visit (INDEPENDENT_AMBULATORY_CARE_PROVIDER_SITE_OTHER): Payer: Self-pay | Admitting: *Deleted

## 2021-10-10 NOTE — Telephone Encounter (Signed)
Reason for Disposition  [1] MODERATE pain (e.g., interferes with normal activities) AND [2] present > 3 days  Answer Assessment - Initial Assessment Questions 1. ONSET: "When did the pain start?"     Today I'm having muscle spasms in my arm.  Using pain patches and heat.  I woke up with my arm feeling like it was asleep.   I had a crick in my neck.   It went away but the right hand side is sore on top of my shoulder and down my arm. It hurts when I cough.   2. LOCATION: "Where is the pain located?"     Right arm.    It feels like I pulled a muscle in arm and right side of back.    It's really hurting.   Ibuprofen is being used.   3. PAIN: "How bad is the pain?" (Scale 1-10; or mild, moderate, severe)   - MILD (1-3): Doesn't interfere with normal activities.   - MODERATE (4-7): Interferes with normal activities (e.g., work or school) or awakens from sleep.   - SEVERE (8-10): Excruciating pain, unable to do any normal activities, unable to hold a cup of water.     Severe  4. WORK OR EXERCISE: "Has there been any recent work or exercise that involved this part of the body?"     No 5. CAUSE: "What do you think is causing the arm pain?"     I don't know 6. 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    My arm is throbbing now on the back of my upper arm. 7. PREGNANCY: "Is there any chance you are pregnant?" "When was your last menstrual period?"     N/A  Protocols used: Arm Pain-A-AH

## 2021-10-10 NOTE — Telephone Encounter (Signed)
  Chief Complaint: Right arm pain/muscle spasms into right back and neck near shoulder blade. Symptoms: Feels like a pulled muscle or muscle spasms Frequency: Intermittently it will throb Pertinent Negatives: Patient denies injuries or accidents.   Woke up with a crick in his neck and arm pain. Disposition: [] ED /[] Urgent Care (no appt availability in office) / [x] Appointment(In office/virtual)/ []  [facility]/ [] Home Care/ [] Refused Recommended Disposition /[] [place] [facility]/ []  Follow-up with PCP Additional Notes: Appt made with Edward Mire, Edward [facility]rera for 11/01/2021 at 2:10.  Instructed to go to the ED/urgent care if he gets worse.    Also offered virtual care but he preferred to be seen in person.

## 2021-10-28 ENCOUNTER — Telehealth (INDEPENDENT_AMBULATORY_CARE_PROVIDER_SITE_OTHER): Payer: Self-pay | Admitting: Primary Care

## 2021-10-28 NOTE — Telephone Encounter (Signed)
Copied from [facility] [phone] [phone]. Topic: General - Inquiry >> Oct 28, 2021  2:29 PM Devoria Glassing wrote: Reason for CRM: pt states he can get there before 3 pm to his 10/31 at 2:10 appt time.  He said he thinks he remembers Sharyn Lull saying it was OK for him to be late.  He doesn't get off work until 2:30.  If this is not OK, please call him back.  No other appts that I could schedule.

## 2021-10-31 NOTE — Telephone Encounter (Signed)
Returned pt call pt states he will be here for his appt

## 2021-11-01 ENCOUNTER — Encounter (INDEPENDENT_AMBULATORY_CARE_PROVIDER_SITE_OTHER): Payer: Self-pay | Admitting: Primary Care

## 2021-11-01 ENCOUNTER — Ambulatory Visit (INDEPENDENT_AMBULATORY_CARE_PROVIDER_SITE_OTHER): Payer: 59 | Admitting: Primary Care

## 2021-11-01 VITALS — BP 116/85 | HR 94 | Resp 16 | Wt 222.0 lb

## 2021-11-01 DIAGNOSIS — I10 Essential (primary) hypertension: Secondary | ICD-10-CM

## 2021-11-01 DIAGNOSIS — F5221 Male erectile disorder: Secondary | ICD-10-CM

## 2021-11-01 DIAGNOSIS — M79601 Pain in right arm: Secondary | ICD-10-CM

## 2021-11-01 DIAGNOSIS — E782 Mixed hyperlipidemia: Secondary | ICD-10-CM | POA: Diagnosis not present

## 2021-11-01 DIAGNOSIS — E6609 Other obesity due to excess calories: Secondary | ICD-10-CM

## 2021-11-01 DIAGNOSIS — Z760 Encounter for issue of repeat prescription: Secondary | ICD-10-CM

## 2021-11-01 DIAGNOSIS — Z6833 Body mass index (BMI) 33.0-33.9, adult: Secondary | ICD-10-CM

## 2021-11-01 MED ORDER — JANUMET 50-1000 MG PO TABS: 1.0000 | ORAL_TABLET | Freq: Two times a day (BID) | ORAL | 1 refills | Status: DC

## 2021-11-01 MED ORDER — ATORVASTATIN CALCIUM 40 MG PO TABS: 40.0000 mg | ORAL_TABLET | Freq: Every day | ORAL | 3 refills | Status: DC

## 2021-11-01 MED ORDER — AMLODIPINE BESYLATE 10 MG PO TABS: 10.0000 mg | ORAL_TABLET | Freq: Every day | ORAL | 1 refills | Status: DC

## 2021-11-01 MED ORDER — SILDENAFIL CITRATE 100 MG PO TABS: 50.0000 mg | ORAL_TABLET | Freq: Every day | ORAL | 3 refills | Status: DC | PRN

## 2021-11-01 MED ORDER — GLIPIZIDE ER 10 MG PO TB24: 10.0000 mg | ORAL_TABLET | Freq: Every day | ORAL | 1 refills | Status: DC

## 2021-11-01 NOTE — Patient Instructions (Signed)
Acute Pain, Adult Acute pain is a type of sudden pain that may last for just a few days or for as long as three months. It is often related to an illness, injury, or a medical procedure. Acute pain may be mild, moderate, or severe. Pain can make it hard for you to do your daily activities. It can cause anxiety and lead to other problems if it is not treated. Treatment may not take all the pain away, but it may lessen the pain so you can move around and tolerate it. Pain is best treated with medicines and other therapies such as distraction, meditation, oils from plants (aromatherapy), heat, and ice. Treatment depends on the cause of the pain and how severe it is. Acute pain usually goes away once your injury has healed or you are no longer ill. Follow these instructions at home: Medicines  Take over-the-counter and prescription medicines only as told by your health care provider. Take the lowest dose of medicine for the shortest amount of time needed to relieve the pain. If you are taking prescription pain medicine: Do not stop taking the medicine suddenly. Talk to your health care provider about how and when to stop taking prescription medicine. Do not take more pills than told by your health care provider even if your pain is severe. Do not take other over-the-counter pain medicines in addition to prescription pain medicine unless told by your health care provider. Keep your medicine in a safe place, away from children or anyone who could use it in a way that it was not prescribed. Ask your health care provider if the medicine prescribed to you requires you to avoid driving or using machinery. Managing pain, stiffness, and swelling     If told, put ice on the affected area. Put ice in a plastic bag. Place a towel between your skin and the bag. Leave the ice on for 20 minutes, 2-3 times a day. If told, apply heat to the affected area as often as told by your health care provider. Use the heat  source that your health care provider recommends, such as a moist heat pack or a heating pad. Place a towel between your skin and the heat source. Leave the heat on for 20-30 minutes. If your skin turns bright red, remove the ice or heat right away to prevent skin damage. The risk of damage is higher if you cannot feel pain, heat, or cold. Managing constipation Your medicines may cause constipation. To prevent or treat constipation, you may need to: Drink enough fluid to keep your urine pale yellow. Take over-the-counter or prescription medicines. Eat foods that are high in fiber, such as beans, whole grains, and fresh fruits and vegetables. Limit foods that are high in fat and processed sugars, such as fried or sweet foods. Activity Rest as told by your health care provider. Return to your normal activities as told by your health care provider. Ask your health care provider what activities are safe for you. Ask your health care provider if doing physical therapy exercises to improve movement and strength can help you manage your pain. General instructions Check your pain level as told by your health care provider. Ask your health care provider if distraction, relaxation, or aromatherapy can help you manage your pain. Keep all follow-up visits. Your health care provider will monitor your pain level. Contact a health care provider if: Your pain is not controlled by medicine. Your pain does not improve or gets worse. You have  side effects from pain medicines. Get help right away if: You have severe pain. You have trouble breathing. You faint, or another person sees you faint. You have chest pain or pressure that lasts for more than a few minutes, or if you have other symptoms along with chest pain, including: Pain or discomfort in one or both arms, your back, neck, jaw, or stomach. Shortness of breath. A cold sweat. Nausea. Feeling light-headed. These symptoms may be an emergency. Get  help right away. Call 911. Do not wait to see if the symptoms will go away. Do not drive yourself to the hospital. This information is not intended to replace advice given to you by your health care provider. Make sure you discuss any questions you have with your health care provider. Document Revised: 07/13/2021 Document Reviewed: 07/13/2021 Elsevier Patient Education  [place].

## 2021-11-01 NOTE — Addendum Note (Signed)
Addended by: Juluis Mire on: 11/01/2021 02:13 PM   Modules accepted: Level of Service

## 2021-11-01 NOTE — Progress Notes (Signed)
Arm pain

## 2021-11-01 NOTE — Addendum Note (Signed)
Addended by: Juluis Mire on: 11/01/2021 02:13 PM   Modules accepted: Orders

## 2021-11-01 NOTE — Progress Notes (Addendum)
[place]  Edward Cabrera, is a 61 y.o. male  UEA:[id number]  YNW:[id number]  DOB - [birth year]  Chief Complaint  Patient presents with   Arm Pain    Right arm Pulled muscle about a month ago  Numbness hand        Subjective:   Edward Cabrera is a 61 y.o. male here today for a acute visit.  Currently a month and a half ago he felt like he pulled a muscle in his right arm since then numbness and tingling has been radiating down to his hand.  This is also interfering with his fingers picking up small objects such as a coffee cup or phone.  Patient is requesting something to help with the pain explain did not prescribe pain medication would refer him to orthopedics patient in agreement.  Patient has No headache, No chest pain, No abdominal pain - No Nausea, No new weakness tingling or numbness, No Cough - shortness of breath.  Question patient about type 2 diabetes states he is doing good therapy in a couple weeks he is moving and groving.  No problems updated.  Allergies  Allergen Reactions   Iohexol      Code: HIVES, Desc: PT. WAS GIVEN 50 MG BENADRLY IV 30 MINUTES PRE-PROCEDURE W/ NO REACTION-----ARS 10-21-05, Onset Date: [id number]    Prednisone & Diphenhydramine Other (See Comments)    Past Medical History:  Diagnosis Date   Diabetes mellitus without complication ([place])    Gout    Hypertension    Pulmonary embolism ([place])     Current Outpatient Medications on File Prior to Visit  Medication Sig Dispense Refill   acetaminophen (TYLENOL) 325 MG tablet Take 650 mg by mouth every 6 (six) hours as needed for mild pain.     benzonatate (TESSALON) 100 MG capsule Take 1 capsule (100 mg total) by mouth 2 (two) times daily as needed for cough. (Patient not taking: Reported on 11/01/2021) 20 capsule 0   Blood Gluc Meter Disp-Strips (BLOOD GLUCOSE METER DISPOSABLE) DEVI 1 kit by Does not apply route 2 (two) times daily. 1 Bag 0   blood glucose meter kit and supplies  Dispense based on patient and insurance preference. Use up to four times daily as directed. (FOR ICD-10 E10.9, E11.9). 1 each 0   lisinopril-hydrochlorothiazide (ZESTORETIC) 10-12.5 MG tablet Take 1 tablet by mouth daily. 90 tablet 1   No current facility-administered medications on file prior to visit.    Objective:   Vitals:   11/01/21 1354  BP: 116/85  Pulse: 94  Resp: 16  SpO2: 95%  Weight: 222 lb (100.7 kg)    Exam General appearance : Awake, alert, not in any distress. Speech Clear. Not toxic looking HEENT: Atraumatic and Normocephalic, pupils equally reactive to light and accomodation Neck: Supple, no JVD. No cervical lymphadenopathy.  Chest: Good air entry bilaterally, no added sounds  CVS: S1 S2 regular, no murmurs.  Abdomen: Bowel sounds present, Non tender and not distended with no gaurding, rigidity or rebound. Extremities: B/L Lower Ext shows no edema, both legs are warm to touch Neurology: Awake alert, and oriented X 3, CN II-XII intact, Non focal Skin: No Rash  Data Review Lab Results  Component Value Date   HGBA1C 8.3 (A) 02/09/2021   HGBA1C 7.6 (A) 11/11/2020   HGBA1C 7.3 (A) 08/11/2020    Assessment & Plan   Edward Cabrera was seen today for arm pain.  Diagnoses and all orders for this visit:   Right arm pain  See HPI -   Ambulatory referral to Orthopedic Surgery  Erectile disorder, generalized, mild -     sildenafil (VIAGRA) 100 MG tablet; Take 0.5 tablets (50 mg total) by mouth daily as needed for erectile dysfunction.  Class 1 obesity due to excess calories without serious comorbidity with body mass index (BMI) of 34.0 to 34.9 in adult Obesity is 30-39 indicating an excess in caloric intake or underlining conditions. This may lead to other co-morbidities. Educated on lifestyle modifications of diet and exercise which may reduce obesity.   -     sitaGLIPtin-metformin (JANUMET) 50-1000 MG tablet; Take 1 tablet by mouth 2 (two) times daily with a  meal.  Mixed hyperlipidemia  Healthy lifestyle diet of fruits vegetables fish nuts whole grains and low saturated fat . Foods high in cholesterol or liver, fatty meats,cheese, butter avocados, nuts and seeds, chocolate and fried foods.    -     atorvastatin (LIPITOR) 40 MG tablet; Take 1 tablet (40 mg total) by mouth daily.  Essential hypertension Well controlled BP goal - < 130/80 Explained that having normal blood pressure is the goal and medications are helping to get to goal and maintain normal blood pressure. DIET: Limit salt intake, read nutrition labels to check salt content, limit fried and high fatty foods  Avoid using multisymptom OTC cold preparations that generally contain sudafed which can rise BP. Consult with pharmacist on best cold relief products to use for persons with HTN EXERCISE Discussed incorporating exercise such as walking - 30 minutes most days of the week and can do in 10 minute intervals   . -     amLODipine (NORVASC) 10 MG tablet; Take 1 tablet (10 mg total) by mouth daily.  Encounter for medication refill -     sildenafil (VIAGRA) 100 MG tablet; Take 0.5 tablets (50 mg total) by mouth daily as needed for erectile dysfunction. -     sitaGLIPtin-metformin (JANUMET) 50-1000 MG tablet; Take 1 tablet by mouth 2 (two) times daily with a meal. -     glipiZIDE (GLUCOTROL XL) 10 MG 24 hr tablet; Take 1 tablet (10 mg total) by mouth daily with breakfast. -     atorvastatin (LIPITOR) 40 MG tablet; Take 1 tablet (40 mg total) by mouth daily. -     amLODipine (NORVASC) 10 MG tablet; Take 1 tablet (10 mg total) by mouth daily.     Patient have been counseled extensively about nutrition and exercise. Other issues discussed during this visit include: low cholesterol diet, weight control and daily exercise, foot care, annual eye examinations at Ophthalmology, importance of adherence with medications and regular follow-up. We also discussed long term complications of  uncontrolled diabetes and hypertension.   Keep scheduled appointment  The patient was given clear instructions to go to ER or return to medical center if symptoms don't improve, worsen or new problems develop. The patient verbalized understanding. The patient was told to call to get lab results if they haven't heard anything in the next week.   This note has been created with Surveyor, quantity. Any transcriptional errors are unintentional.   [name], NP 11/01/2021, 2:12 PM

## 2021-11-04 ENCOUNTER — Ambulatory Visit: Payer: 59 | Admitting: Physician Assistant

## 2021-11-11 ENCOUNTER — Ambulatory Visit: Payer: 59 | Admitting: Physician Assistant

## 2021-11-15 ENCOUNTER — Encounter (INDEPENDENT_AMBULATORY_CARE_PROVIDER_SITE_OTHER): Payer: Self-pay | Admitting: Primary Care

## 2021-11-15 ENCOUNTER — Ambulatory Visit (INDEPENDENT_AMBULATORY_CARE_PROVIDER_SITE_OTHER): Payer: 59 | Admitting: Primary Care

## 2021-11-15 VITALS — BP 140/88 | HR 104 | Resp 16 | Ht 68.0 in | Wt 222.0 lb

## 2021-11-15 DIAGNOSIS — Z6834 Body mass index (BMI) 34.0-34.9, adult: Secondary | ICD-10-CM

## 2021-11-15 DIAGNOSIS — I10 Essential (primary) hypertension: Secondary | ICD-10-CM

## 2021-11-15 DIAGNOSIS — Z1159 Encounter for screening for other viral diseases: Secondary | ICD-10-CM

## 2021-11-15 DIAGNOSIS — E782 Mixed hyperlipidemia: Secondary | ICD-10-CM

## 2021-11-15 DIAGNOSIS — Z114 Encounter for screening for human immunodeficiency virus [HIV]: Secondary | ICD-10-CM | POA: Diagnosis not present

## 2021-11-15 DIAGNOSIS — E119 Type 2 diabetes mellitus without complications: Secondary | ICD-10-CM

## 2021-11-15 DIAGNOSIS — E6609 Other obesity due to excess calories: Secondary | ICD-10-CM

## 2021-11-15 DIAGNOSIS — E66811 Obesity, class 1: Secondary | ICD-10-CM

## 2021-11-15 LAB — POCT GLYCOSYLATED HEMOGLOBIN (HGB A1C): HbA1c, POC (controlled diabetic range): 8.2 % — AB (ref 0.0–7.0)

## 2021-11-15 NOTE — Progress Notes (Signed)
A1c-8.2

## 2021-11-16 LAB — SPECIMEN STATUS

## 2021-11-21 NOTE — Progress Notes (Signed)
Subjective:  Patient ID: Edward Cabrera, male    DOB: 02-20-1960  Age: 61 y.o. MRN: [id number]  CC: Hypertension and Diabetes   HPI EMILE KYLLO presents forFollow-up of diabetes. Patient does not check blood sugar at home  Compliant with meds - Yes Checking CBGs? No  Fasting avg -   Postprandial average -  Exercising regularly? - Yes Watching carbohydrate intake? - Yes Neuropathy ? - No Hypoglycemic events - No  - Recovers with :   Pertinent ROS:  Polyuria - No Polydipsia - No Vision problems - No Management of HTN- Patient has No headache, No chest pain, No abdominal pain - No Nausea, No new weakness tingling or numbness, No Cough - shortness of breath  Medications as noted below. Taking them regularly without complication/adverse reaction being reported today.   History Tedford has a past medical history of Diabetes mellitus without complication ([place]), Gout, Hypertension, and Pulmonary embolism ([place]).   He has a past surgical history that includes Rotator cuff repair (Left).   His family history includes Diabetes in an other family member; Healthy in his mother.He reports that he has never smoked. He has never used smokeless tobacco. He reports that he does not currently use alcohol. He reports current drug use. Drug: Marijuana.  Current Outpatient Medications on File Prior to Visit  Medication Sig Dispense Refill   acetaminophen (TYLENOL) 325 MG tablet Take 650 mg by mouth every 6 (six) hours as needed for mild pain.     amLODipine (NORVASC) 10 MG tablet Take 1 tablet (10 mg total) by mouth daily. 90 tablet 1   atorvastatin (LIPITOR) 40 MG tablet Take 1 tablet (40 mg total) by mouth daily. 90 tablet 3   Blood Gluc Meter Disp-Strips (BLOOD GLUCOSE METER DISPOSABLE) DEVI 1 kit by Does not apply route 2 (two) times daily. 1 Bag 0   blood glucose meter kit and supplies Dispense based on patient and insurance preference. Use up to four times daily as directed. (FOR ICD-10  E10.9, E11.9). 1 each 0   lisinopril-hydrochlorothiazide (ZESTORETIC) 10-12.5 MG tablet Take 1 tablet by mouth daily. 90 tablet 1   sildenafil (VIAGRA) 100 MG tablet Take 0.5 tablets (50 mg total) by mouth daily as needed for erectile dysfunction. 10 tablet 3   sitaGLIPtin-metformin (JANUMET) 50-1000 MG tablet Take 1 tablet by mouth 2 (two) times daily with a meal. 180 tablet 1   benzonatate (TESSALON) 100 MG capsule Take 1 capsule (100 mg total) by mouth 2 (two) times daily as needed for cough. (Patient not taking: Reported on 11/15/2021) 20 capsule 0   No current facility-administered medications on file prior to visit.    ROS Comprehensive ROS Pertinent positive and negative noted in HPI    Objective:  BP (!) 140/88 (BP Location: Left Arm, Patient Position: Sitting, Cuff Size: Large)   Pulse (!) 104   Resp 16   Ht _0  (1.727 m)   Wt 222 lb (100.7 kg)   SpO2 97%   BMI 33.75 kg/m   BP Readings from Last 3 Encounters:  11/15/21 (!) 140/88  11/01/21 116/85  02/09/21 (!) 146/92    Wt Readings from Last 3 Encounters:  11/15/21 222 lb (100.7 kg)  11/01/21 222 lb (100.7 kg)  02/09/21 234 lb (106.1 kg)    Physical Exam General: No apparent distress. Obese male Eyes: Extraocular eye movements intact, pupils equal and round. Neck: Supple, trachea midline. Thyroid: No enlargement, mobile without fixation, no tenderness. Cardiovascular: Regular rhythm  and rate, no murmur, normal radial pulses. Respiratory: Normal respiratory effort, clear to auscultation. Gastrointestinal: Normal pitch active bowel sounds, nontender abdomen without distention or appreciable hepatomegaly. Musculoskeletal: Normal muscle tone, no tenderness on palpation of tibia, no excessive thoracic kyphosis. Skin: Appropriate warmth, no visible rash. Mental status: Alert, conversant, speech clear, thought logical, appropriate mood and affect, no hallucinations or delusions evident. Hematologic/lymphatic: No  cervical adenopathy, no visible ecchymoses.  Lab Results  Component Value Date   HGBA1C 8.2 (A) 11/15/2021   HGBA1C 8.3 (A) 02/09/2021   HGBA1C 7.6 (A) 11/11/2020    Lab Results  Component Value Date   WBC WILL FOLLOW 11/15/2021   HGB WILL FOLLOW 11/15/2021   HCT WILL FOLLOW 11/15/2021   PLT WILL FOLLOW 11/15/2021   GLUCOSE 160 (H) 11/15/2021   CHOL 203 (H) 11/15/2021   TRIG 173 (H) 11/15/2021   HDL 40 11/15/2021   LDLCALC 132 (H) 11/15/2021   ALT 24 11/15/2021   AST 22 11/15/2021   NA 141 11/15/2021   K 3.9 11/15/2021   CL 102 11/15/2021   CREATININE 1.47 (H) 11/15/2021   BUN 11 11/15/2021   CO2 23 11/15/2021   HGBA1C 8.2 (A) 11/15/2021     Assessment & Plan:   Jden was seen today for hypertension and diabetes.  Diagnoses and all orders for this visit:  Encounter for HCV screening test for low risk patient -     HCV Ab w Reflex to Quant PCR  Encounter for screening for HIV -     HIV Antibody (routine testing w rflx)  Type 2 diabetes mellitus without complication, without long-term current use of insulin ([place]) - educated on lifestyle modifications, including but not limited to diet choices and adding exercise to daily routine.   -     Microalbumin / creatinine urine ratio -     POCT glycosylated hemoglobin (Hb A1C)  Mixed hyperlipidemia  Healthy lifestyle diet of fruits vegetables fish nuts whole grains and low saturated fat . Foods high in cholesterol or liver, fatty meats,cheese, butter avocados, nuts and seeds, chocolate and fried foods. -     Lipid panel  Essential hypertension BP goal - < 130/80 Explained that having normal blood pressure is the goal and medications are helping to get to goal and maintain normal blood pressure. DIET: Limit salt intake, read nutrition labels to check salt content, limit fried and high fatty foods  Avoid using multisymptom OTC cold preparations that generally contain sudafed which can rise BP. Consult with pharmacist on  best cold relief products to use for persons with HTN EXERCISE Discussed incorporating exercise such as walking - 30 minutes most days of the week and can do in 10 minute intervals    -     CMP14+EGFR  Class 1 obesity due to excess calories without serious comorbidity with body mass index (BMI) of 33.0 to 33.9 in adult Obesity is 30-39 indicating an excess in caloric intake or underlining conditions. This may lead to other co-morbidities. Educated on lifestyle modifications of diet and exercise which may reduce obesity.    Other orders -     Specimen Status -     Interpretation:   I have discontinued Danne Baxter T. Kingdon's glipiZIDE. I am also having him maintain his acetaminophen, blood glucose meter kit and supplies, BLOOD GLUCOSE METER DISPOSABLE, benzonatate, lisinopril-hydrochlorothiazide, sildenafil, Janumet, atorvastatin, and amLODipine.  No orders of the defined types were placed in this encounter.    Follow-up:   No follow-ups on  file.  The above assessment and management plan was discussed with the patient. The patient verbalized understanding of and has agreed to the management plan. Patient is aware to call the clinic if symptoms fail to improve or worsen. Patient is aware when to return to the clinic for a follow-up visit. Patient educated on when it is appropriate to go to the emergency department.   Juluis Mire, NP-C

## 2021-11-22 LAB — CMP14+EGFR
ALT: 24 IU/L (ref 0–44)
AST: 22 IU/L (ref 0–40)
Albumin/Globulin Ratio: 1.8 (ref 1.2–2.2)
Albumin: 4.6 g/dL (ref 3.8–4.9)
Alkaline Phosphatase: 77 IU/L (ref 44–121)
BUN/Creatinine Ratio: 7 — ABNORMAL LOW (ref 9–20)
BUN: 11 mg/dL (ref 6–24)
Bilirubin Total: 0.3 mg/dL (ref 0.0–1.2)
CO2: 23 mmol/L (ref 20–29)
Calcium: 9.9 mg/dL (ref 8.7–10.2)
Chloride: 102 mmol/L (ref 96–106)
Creatinine, Ser: 1.47 mg/dL — ABNORMAL HIGH (ref 0.76–1.27)
Globulin, Total: 2.5 g/dL (ref 1.5–4.5)
Glucose: 160 mg/dL — ABNORMAL HIGH (ref 70–99)
Potassium: 3.9 mmol/L (ref 3.5–5.2)
Sodium: 141 mmol/L (ref 134–144)
Total Protein: 7.1 g/dL (ref 6.0–8.5)
eGFR: 55 mL/min/{1.73_m2} — ABNORMAL LOW (ref >59)

## 2021-11-22 LAB — LIPID PANEL
Chol/HDL Ratio: 5.1 ratio — ABNORMAL HIGH (ref 0.0–5.0)
Cholesterol, Total: 203 mg/dL — ABNORMAL HIGH (ref 100–199)
HDL: 40 mg/dL (ref >39)
LDL Chol Calc (NIH): 132 mg/dL — ABNORMAL HIGH (ref 0–99)
Triglycerides: 173 mg/dL — ABNORMAL HIGH (ref 0–149)
VLDL Cholesterol Cal: 31 mg/dL (ref 5–40)

## 2021-11-22 LAB — HIV ANTIBODY (ROUTINE TESTING W REFLEX): HIV Screen 4th Generation wRfx: NONREACTIVE

## 2021-11-22 LAB — HCV INTERPRETATION

## 2021-11-22 LAB — HCV AB W REFLEX TO QUANT PCR: HCV Ab: NONREACTIVE

## 2021-11-22 LAB — SPECIMEN STATUS REPORT

## 2021-11-27 ENCOUNTER — Emergency Department (HOSPITAL_COMMUNITY)
Admission: EM | Admit: 2021-11-27 | Discharge: 2021-11-28 | Disposition: A | Discharge status: Home / Self Care | Payer: 59 | Attending: Emergency Medicine | Admitting: Emergency Medicine

## 2021-11-27 ENCOUNTER — Other Ambulatory Visit: Payer: Self-pay

## 2021-11-27 ENCOUNTER — Encounter (HOSPITAL_COMMUNITY): Payer: Self-pay

## 2021-11-27 DIAGNOSIS — X58XXXA Exposure to other specified factors, initial encounter: Secondary | ICD-10-CM | POA: Insufficient documentation

## 2021-11-27 DIAGNOSIS — E119 Type 2 diabetes mellitus without complications: Secondary | ICD-10-CM | POA: Insufficient documentation

## 2021-11-27 DIAGNOSIS — W268XXA Contact with other sharp object(s), not elsewhere classified, initial encounter: Secondary | ICD-10-CM | POA: Diagnosis not present

## 2021-11-27 DIAGNOSIS — I10 Essential (primary) hypertension: Secondary | ICD-10-CM | POA: Insufficient documentation

## 2021-11-27 DIAGNOSIS — Z7984 Long term (current) use of oral hypoglycemic drugs: Secondary | ICD-10-CM | POA: Diagnosis not present

## 2021-11-27 DIAGNOSIS — Z5321 Procedure and treatment not carried out due to patient leaving prior to being seen by health care provider: Secondary | ICD-10-CM | POA: Insufficient documentation

## 2021-11-27 DIAGNOSIS — S81819A Laceration without foreign body, unspecified lower leg, initial encounter: Secondary | ICD-10-CM | POA: Insufficient documentation

## 2021-11-27 MED ORDER — TETANUS-DIPHTH-ACELL PERTUSSIS 5-2.5-18.5 LF-MCG/0.5 IM SUSY: 0.5000 mL | PREFILLED_SYRINGE | Freq: Once | INTRAMUSCULAR | Status: DC

## 2021-11-27 MED ORDER — LIDOCAINE-EPINEPHRINE (PF) 2 %-1:200000 IJ SOLN: 20.0000 mL | Freq: Once | INTRAMUSCULAR | Status: DC

## 2021-11-27 NOTE — ED Provider Triage Note (Signed)
Emergency Medicine Provider Triage Evaluation Note  Edward Cabrera , a 61 y.o. male  was evaluated in triage.  Pt complains of laceration. Lac to right lateral calf just PTA. Occurred on nail. Tetanus in [facility] in 2022. No bony pain. No pulsatile bleeding. Deep v- shaped lac approx 12 cm. Full ROM  Review of Systems  Positive: laceration Negative:   Physical Exam  BP (!) 150/103   Pulse (!) 105   Temp 98.2 F (36.8 C) (Oral)   Resp 18   SpO2 98%  Gen:   Awake, no distress   Resp:  Normal effort  MSK:   Moves extremities without difficulty  Skin:  12 cm laceration to right lateral calf. No pulsatile bleeding. No obvious exposed bone Other:    Medical Decision Making  Medically screening exam initiated at 9:13 PM.  Appropriate orders placed.  JOSHIAH TRAYNHAM was informed that the remainder of the evaluation will be completed by another provider, this initial triage assessment does not replace that evaluation, and the importance of remaining in the ED until their evaluation is complete.  Laceration   Levell Tavano A, PA-C 11/27/21 2116

## 2021-11-27 NOTE — ED Triage Notes (Signed)
Pt reports he was standing on a cabinet when it collapsed resulting in a nail cutting his right lower leg. V-shaped deep laceration noted, bleeding controlled, no obvious bone exposure. Wound dressed in triage.

## 2021-11-28 ENCOUNTER — Emergency Department (HOSPITAL_BASED_OUTPATIENT_CLINIC_OR_DEPARTMENT_OTHER)
Admission: EM | Admit: 2021-11-28 | Discharge: 2021-11-28 | Disposition: A | Discharge status: Home / Self Care | Payer: 59 | Source: Home / Self Care | Attending: Emergency Medicine | Admitting: Emergency Medicine

## 2021-11-28 ENCOUNTER — Emergency Department (HOSPITAL_BASED_OUTPATIENT_CLINIC_OR_DEPARTMENT_OTHER): Payer: 59 | Admitting: Radiology

## 2021-11-28 ENCOUNTER — Other Ambulatory Visit: Payer: Self-pay

## 2021-11-28 ENCOUNTER — Encounter (HOSPITAL_BASED_OUTPATIENT_CLINIC_OR_DEPARTMENT_OTHER): Payer: Self-pay | Admitting: Emergency Medicine

## 2021-11-28 DIAGNOSIS — Y999 Unspecified external cause status: Secondary | ICD-10-CM | POA: Insufficient documentation

## 2021-11-28 DIAGNOSIS — Z7984 Long term (current) use of oral hypoglycemic drugs: Secondary | ICD-10-CM | POA: Insufficient documentation

## 2021-11-28 DIAGNOSIS — Y92838 Other recreation area as the place of occurrence of the external cause: Secondary | ICD-10-CM | POA: Insufficient documentation

## 2021-11-28 DIAGNOSIS — I10 Essential (primary) hypertension: Secondary | ICD-10-CM | POA: Insufficient documentation

## 2021-11-28 DIAGNOSIS — W268XXA Contact with other sharp object(s), not elsewhere classified, initial encounter: Secondary | ICD-10-CM | POA: Insufficient documentation

## 2021-11-28 DIAGNOSIS — Y939 Activity, unspecified: Secondary | ICD-10-CM | POA: Insufficient documentation

## 2021-11-28 DIAGNOSIS — S81811A Laceration without foreign body, right lower leg, initial encounter: Secondary | ICD-10-CM | POA: Insufficient documentation

## 2021-11-28 DIAGNOSIS — E119 Type 2 diabetes mellitus without complications: Secondary | ICD-10-CM | POA: Insufficient documentation

## 2021-11-28 MED ORDER — LIDOCAINE-EPINEPHRINE (PF) 2 %-1:200000 IJ SOLN
20.0000 mL | Freq: Once | INTRAMUSCULAR | Status: DC
  Filled 2021-11-28: qty 20

## 2021-11-28 NOTE — ED Triage Notes (Signed)
Pt arrives to ED with c/o laceration to right leg.

## 2021-11-28 NOTE — ED Notes (Signed)
Pt's spouse stated that due to the long wait time, they were going to leave and go somewhere else for pt to receive sutures. Moving pt OTF.

## 2021-11-28 NOTE — Discharge Instructions (Signed)
Keep wound dry for 24 hours.  Then can now wash with soap and water.  Recommend suture and staple removal in 7 to 10 days.  May return to work.  Work note provided.

## 2021-11-28 NOTE — ED Provider Notes (Signed)
  [facility] EMERGENCY DEPT Provider Note   CSN: [id number] Arrival date & time: 11/28/21  0656        Procedures .Marland KitchenLaceration Repair  Date/Time: 11/28/2021 9:55 AM  Performed by: Saddie Benders, PA-C Authorized by: Saddie Benders, PA-C   Consent:    Consent obtained:  Verbal   Consent given by:  Patient   Risks discussed:  Infection, pain, retained foreign body, poor cosmetic result, poor wound healing and need for additional repair Universal protocol:    Patient identity confirmed:  Verbally with patient Anesthesia:    Anesthesia method:  Local infiltration   Local anesthetic:  Lidocaine 2% WITH epi Laceration details:    Location:  Leg   Leg location:  R lower leg   Length (cm):  8 Pre-procedure details:    Preparation:  Patient was prepped and draped in usual sterile fashion and imaging obtained to evaluate for foreign bodies Exploration:    Limited defect created (wound extended): yes     Hemostasis achieved with:  Direct pressure   Wound exploration: wound explored through full range of motion and entire depth of wound visualized     Wound extent: fascia violated     Wound extent: no foreign body and no underlying fracture   Treatment:    Area cleansed with:  Povidone-iodine   Amount of cleaning:  Standard   Irrigation solution:  Sterile saline   Debridement:  None   Undermining:  None Skin repair:    Repair method:  Staples and sutures   Suture size:  5-0   Suture material:  Prolene   Suture technique:  Simple interrupted   Number of sutures:  1   Number of staples:  14 Approximation:    Approximation:  Close Repair type:    Repair type:  Intermediate Post-procedure details:    Procedure completion:  Tolerated well, no immediate complications    1 anchoring stitch as noted.      Saddie Benders, PA-C 11/28/21 3419    Vanetta Mulders, MD 11/28/21 1000

## 2021-11-28 NOTE — ED Provider Notes (Signed)
[facility] EMERGENCY DEPT Provider Note   CSN: [id number] Arrival date & time: 11/28/21  1324     History  Chief Complaint  Patient presents with   Laceration    Edward Cabrera is a 61 y.o. male.  Patient sustained a V-shaped laceration to his right lower extremity leg area more lateral at about 7 PM last night states that a screw or nail on an entertainment center cut it.  States his tetanus is up-to-date.  Patient initially went to [facility] waited and then came here.  At [facility] they did dress his wound with a nonadherent dressing.  Patient denies any other injuries.  Past medical history significant hypertension diabetes without complications history of pulmonary embolism.  Patient does not use tobacco products.  Patient is not on blood thinners.       Home Medications Prior to Admission medications   Medication Sig Start Date End Date Taking? Authorizing Provider  acetaminophen (TYLENOL) 325 MG tablet Take 650 mg by mouth every 6 (six) hours as needed for mild pain.    [provider]  amLODipine (NORVASC) 10 MG tablet Take 1 tablet (10 mg total) by mouth daily. 11/01/21   [name], NP  atorvastatin (LIPITOR) 40 MG tablet Take 1 tablet (40 mg total) by mouth daily. 11/01/21   [name], NP  benzonatate (TESSALON) 100 MG capsule Take 1 capsule (100 mg total) by mouth 2 (two) times daily as needed for cough. Patient not taking: Reported on 11/15/2021 02/15/21   Perlie Mayo, NP  Blood Gluc Meter Disp-Strips (BLOOD GLUCOSE METER DISPOSABLE) DEVI 1 kit by Does not apply route 2 (two) times daily. 05/12/19   [name], NP  blood glucose meter kit and supplies Dispense based on patient and insurance preference. Use up to four times daily as directed. (FOR ICD-10 E10.9, E11.9). 05/12/19   [name], NP  lisinopril-hydrochlorothiazide (ZESTORETIC) 10-12.5 MG tablet Take 1 tablet by mouth daily. 05/24/21   [name], NP   sildenafil (VIAGRA) 100 MG tablet Take 0.5 tablets (50 mg total) by mouth daily as needed for erectile dysfunction. 11/01/21   [name], NP  sitaGLIPtin-metformin (JANUMET) 50-1000 MG tablet Take 1 tablet by mouth 2 (two) times daily with a meal. 11/01/21   [name], NP      Allergies    Iohexol and Prednisone & diphenhydramine    Review of Systems   Review of Systems  Constitutional:  Negative for chills and fever.  HENT:  Negative for rhinorrhea and sore throat.   Eyes:  Negative for visual disturbance.  Respiratory:  Negative for cough and shortness of breath.   Cardiovascular:  Negative for chest pain and leg swelling.  Gastrointestinal:  Negative for abdominal pain, diarrhea, nausea and vomiting.  Genitourinary:  Negative for dysuria.  Musculoskeletal:  Negative for back pain and neck pain.  Skin:  Positive for wound. Negative for rash.  Neurological:  Negative for dizziness, light-headedness and headaches.  Hematological:  Does not bruise/bleed easily.  Psychiatric/Behavioral:  Negative for confusion.     Physical Exam Updated Vital Signs BP (!) 172/111   Pulse 62   Temp 98.6 F (37 C) (Oral)   Resp 16   SpO2 99%  Physical Exam Vitals and nursing note reviewed.  Constitutional:      General: He is not in acute distress.    Appearance: Normal appearance. He is well-developed.  HENT:     Head: Normocephalic and  atraumatic.  Eyes:     Conjunctiva/sclera: Conjunctivae normal.  Cardiovascular:     Rate and Rhythm: Normal rate and regular rhythm.     Heart sounds: No murmur heard. Pulmonary:     Effort: Pulmonary effort is normal. No respiratory distress.     Breath sounds: Normal breath sounds.  Abdominal:     Palpations: Abdomen is soft.     Tenderness: There is no abdominal tenderness.  Musculoskeletal:        General: Tenderness and signs of injury present. No swelling.     Cervical back: Neck supple.     Comments: Tenderness to the  lateral aspect of the right tib-fib with a V-shaped laceration measuring approximately 12 cm.  Muscle is exposed.  No active bleeding.  No bone exposed.  Distally neurovascularly intact.  Skin:    General: Skin is warm and dry.     Capillary Refill: Capillary refill takes less than 2 seconds.  Neurological:     General: No focal deficit present.     Mental Status: He is alert and oriented to person, place, and time.  Psychiatric:        Mood and Affect: Mood normal.     ED Results / Procedures / Treatments   Labs (all labs ordered are listed, but only abnormal results are displayed) Labs Reviewed - No data to display  EKG None  Radiology DG Tibia/Fibula Right  Result Date: 11/28/2021 CLINICAL DATA:  Laceration EXAM: RIGHT TIBIA AND FIBULA - 2 VIEW COMPARISON:  None Available. FINDINGS: No acute fracture, dislocation or subluxation. No osteolytic or osteoblastic changes. There are degenerative changes noted for the medial compartment of the knee. There is soft tissue calcification anterior to the talus that may be related to an old injury. There is some air density overlying the lateral proximal calf consistent with the presence of a laceration. IMPRESSION: No acute osseous abnormalities. Soft tissue defect consistent with laceration. Possible chronic posttraumatic changes of the ankle and degenerative changes for the knee. Electronically Signed   By: Sammie Bench M.D.   On: 11/28/2021 08:27    Procedures Procedures    Medications Ordered in ED Medications  lidocaine-EPINEPHrine (XYLOCAINE W/EPI) 2 %-1:200000 (PF) injection 20 mL (has no administration in time range)    ED Course/ Medical Decision Making/ A&P                           Medical Decision Making Amount and/or Complexity of Data Reviewed Radiology: ordered.  Risk Prescription drug management.   Significant laceration V-shaped to rule right lower extremity.  Will get x-rays to make sure no bony abnormality.   X-rays negative for any bony abnormalities.  Tetanus is up-to-date.  Will have my physician assistant repair of the laceration.   Final Clinical Impression(s) / ED Diagnoses Final diagnoses:  Laceration of right lower extremity, initial encounter    Rx / DC Orders ED Discharge Orders     None         Fredia Sorrow, MD 11/28/21 [phone]

## 2021-11-28 NOTE — ED Provider Notes (Signed)
Informed by nursing that patient was at another facility and had eloped after first look.    Edward Cabrera, Barbara Cower, MD 11/28/21 [phone]

## 2021-12-01 ENCOUNTER — Other Ambulatory Visit (INDEPENDENT_AMBULATORY_CARE_PROVIDER_SITE_OTHER): Payer: Self-pay | Admitting: Primary Care

## 2021-12-01 DIAGNOSIS — I10 Essential (primary) hypertension: Secondary | ICD-10-CM

## 2021-12-01 NOTE — Telephone Encounter (Signed)
Requested Prescriptions  Pending Prescriptions Disp Refills   lisinopril-hydrochlorothiazide (ZESTORETIC) 10-12.5 MG tablet [Pharmacy Med Name: Lisinopril-hydroCHLOROthiazide 10-12.5 MG Oral Tablet] 90 tablet 0    Sig: Take 1 tablet by mouth once daily     Cardiovascular:  ACEI + Diuretic Combos Failed - 12/01/2021 12:23 PM      Failed - Cr in normal range and within 180 days    Creatinine, Ser  Date Value Ref Range Status  11/15/2021 1.47 (H) 0.76 - 1.27 mg/dL Final         Failed - Last BP in normal range    BP Readings from Last 1 Encounters:  11/28/21 (!) 166/99         Passed - Na in normal range and within 180 days    Sodium  Date Value Ref Range Status  11/15/2021 141 134 - 144 mmol/L Final         Passed - K in normal range and within 180 days    Potassium  Date Value Ref Range Status  11/15/2021 3.9 3.5 - 5.2 mmol/L Final         Passed - eGFR is 30 or above and within 180 days    GFR calc Af Amer  Date Value Ref Range Status  04/21/2019 73 >59 mL/min/1.73 Final   GFR calc non Af Amer  Date Value Ref Range Status  04/21/2019 63 >59 mL/min/1.73 Final   eGFR  Date Value Ref Range Status  11/15/2021 55 (L) >59 mL/min/1.73 Final         Passed - Patient is not pregnant      Passed - Valid encounter within last 6 months    Recent Outpatient Visits           2 weeks ago Encounter for HCV screening test for low risk patient   [place], Michelle P, NP   1 month ago Right arm pain   [facility] Juluis Mire P, NP   6 months ago Type 2 diabetes mellitus without complication, without long-term current use of insulin ([place])   [place], Michelle P, NP   9 months ago Type 2 diabetes mellitus without complication, without long-term current use of insulin ([place])   [place], [place], NP   1 year ago Essential hypertension   [place], [place], NP       Future Appointments             In 2 months Oletta Lamas, Milford Cage, NP [place]

## 2021-12-08 ENCOUNTER — Emergency Department (HOSPITAL_BASED_OUTPATIENT_CLINIC_OR_DEPARTMENT_OTHER)
Admission: EM | Admit: 2021-12-08 | Discharge: 2021-12-08 | Disposition: A | Discharge status: Home / Self Care | Payer: 59 | Attending: Emergency Medicine | Admitting: Emergency Medicine

## 2021-12-08 ENCOUNTER — Encounter (HOSPITAL_BASED_OUTPATIENT_CLINIC_OR_DEPARTMENT_OTHER): Payer: Self-pay | Admitting: *Deleted

## 2021-12-08 ENCOUNTER — Other Ambulatory Visit: Payer: Self-pay

## 2021-12-08 DIAGNOSIS — I10 Essential (primary) hypertension: Secondary | ICD-10-CM | POA: Diagnosis not present

## 2021-12-08 DIAGNOSIS — Z4802 Encounter for removal of sutures: Secondary | ICD-10-CM | POA: Insufficient documentation

## 2021-12-08 DIAGNOSIS — Z7984 Long term (current) use of oral hypoglycemic drugs: Secondary | ICD-10-CM | POA: Diagnosis not present

## 2021-12-08 DIAGNOSIS — S81811D Laceration without foreign body, right lower leg, subsequent encounter: Secondary | ICD-10-CM | POA: Insufficient documentation

## 2021-12-08 DIAGNOSIS — E119 Type 2 diabetes mellitus without complications: Secondary | ICD-10-CM | POA: Insufficient documentation

## 2021-12-08 DIAGNOSIS — Z79899 Other long term (current) drug therapy: Secondary | ICD-10-CM | POA: Diagnosis not present

## 2021-12-08 DIAGNOSIS — X58XXXD Exposure to other specified factors, subsequent encounter: Secondary | ICD-10-CM | POA: Insufficient documentation

## 2021-12-08 MED ORDER — BACITRACIN ZINC 500 UNIT/GM EX OINT
TOPICAL_OINTMENT | Freq: Once | CUTANEOUS | Status: AC
  Administered 2021-12-08: 1 via TOPICAL
  Filled 2021-12-08: qty 28.35

## 2021-12-08 MED ORDER — CEPHALEXIN 500 MG PO CAPS: 500.0000 mg | ORAL_CAPSULE | Freq: Four times a day (QID) | ORAL | 0 refills | Status: DC

## 2021-12-08 NOTE — Discharge Instructions (Signed)
Take the antibiotics as prescribed.  Continue to apply antibiotic ointment to the wound.  You can wash the wound daily and keep it covered with a sterile dressing

## 2021-12-08 NOTE — ED Notes (Signed)
Right shin wound dressed with bacterin ointment gauze dressing and wrapped

## 2021-12-08 NOTE — ED Triage Notes (Signed)
Pt is here for staple removal from right leg

## 2021-12-08 NOTE — ED Provider Notes (Signed)
[facility] EMERGENCY DEPT Provider Note   CSN: [id number] Arrival date & time: 12/08/21  1312     History  Chief Complaint  Patient presents with   Suture / Staple Removal    Edward Cabrera is a 61 y.o. male.   Suture / Staple Removal     Patient has history of hypertension PE gout diabetes.  He was in the emergency room on November 27 for a laceration to his right lower extremity.  Patient ended up having staples as well as a suture placed he is here for suture removal patient has noticed some drainage but no fevers.  No increasing pain  Home Medications Prior to Admission medications   Medication Sig Start Date End Date Taking? Authorizing Provider  cephALEXin (KEFLEX) 500 MG capsule Take 1 capsule (500 mg total) by mouth 4 (four) times daily. 12/08/21  Yes Dorie Rank, MD  acetaminophen (TYLENOL) 325 MG tablet Take 650 mg by mouth every 6 (six) hours as needed for mild pain.    [provider]  amLODipine (NORVASC) 10 MG tablet Take 1 tablet (10 mg total) by mouth daily. 11/01/21   [name], NP  atorvastatin (LIPITOR) 40 MG tablet Take 1 tablet (40 mg total) by mouth daily. 11/01/21   [name], NP  benzonatate (TESSALON) 100 MG capsule Take 1 capsule (100 mg total) by mouth 2 (two) times daily as needed for cough. Patient not taking: Reported on 11/15/2021 02/15/21   Perlie Mayo, NP  Blood Gluc Meter Disp-Strips (BLOOD GLUCOSE METER DISPOSABLE) DEVI 1 kit by Does not apply route 2 (two) times daily. 05/12/19   [name], NP  blood glucose meter kit and supplies Dispense based on patient and insurance preference. Use up to four times daily as directed. (FOR ICD-10 E10.9, E11.9). 05/12/19   [name], NP  lisinopril-hydrochlorothiazide (ZESTORETIC) 10-12.5 MG tablet Take 1 tablet by mouth once daily 12/01/21   [name], NP  sildenafil (VIAGRA) 100 MG tablet Take 0.5 tablets (50 mg total) by mouth daily as  needed for erectile dysfunction. 11/01/21   [name], NP  sitaGLIPtin-metformin (JANUMET) 50-1000 MG tablet Take 1 tablet by mouth 2 (two) times daily with a meal. 11/01/21   [name], NP      Allergies    Iohexol and Prednisone & diphenhydramine    Review of Systems   Review of Systems  Physical Exam Updated Vital Signs BP (!) 116/91 (BP Location: Right Arm)   Pulse 85   Temp 98.1 F (36.7 C) (Oral)   Resp 16   SpO2 98%  Physical Exam Vitals and nursing note reviewed.  Constitutional:      General: He is not in acute distress.    Appearance: He is well-developed.  HENT:     Head: Normocephalic and atraumatic.     Right Ear: External ear normal.     Left Ear: External ear normal.  Eyes:     General: No scleral icterus.       Right eye: No discharge.        Left eye: No discharge.     Conjunctiva/sclera: Conjunctivae normal.  Neck:     Trachea: No tracheal deviation.  Cardiovascular:     Rate and Rhythm: Normal rate.  Pulmonary:     Effort: Pulmonary effort is normal. No respiratory distress.     Breath sounds: No stridor.  Abdominal:     General: There is no distension.  Musculoskeletal:        General: Tenderness present. No swelling or deformity.     Cervical back: Neck supple.     Comments: Mild erythema and tenderness around the wound site, no lymphangitic streaking, some drainage noted at the wound margins  Skin:    General: Skin is warm and dry.     Findings: No rash.  Neurological:     Mental Status: He is alert.     Cranial Nerves: Cranial nerve deficit: no gross deficits.     ED Results / Procedures / Treatments   Labs (all labs ordered are listed, but only abnormal results are displayed) Labs Reviewed - No data to display  EKG None  Radiology No results found.  Procedures .Suture Removal  Date/Time: 12/08/2021 4:00 PM  Performed by: Dorie Rank, MD Authorized by: Dorie Rank, MD   Consent:    Consent obtained:   Verbal Procedure details:    Wound appearance:  Draining   Number of sutures removed:  1   Number of staples removed:  14 Post-procedure details:    Post-removal:  Dressing applied and antibiotic ointment applied   Procedure completion:  Tolerated well, no immediate complications     Medications Ordered in ED Medications  bacitracin ointment (has no administration in time range)    ED Course/ Medical Decision Making/ A&P                           Medical Decision Making Risk OTC drugs. Prescription drug management.   Patient does have some mild drainage around the wound.  Concerned about the possibility of developing wound infection.  Will start him on a course of antibiotics.  Continue with antibiotic ointment.        Final Clinical Impression(s) / ED Diagnoses Final diagnoses:  Encounter for staple removal  Visit for suture removal    Rx / DC Orders ED Discharge Orders          Ordered    cephALEXin (KEFLEX) 500 MG capsule  4 times daily        12/08/21 1558              Dorie Rank, MD 12/08/21 1601

## 2021-12-13 ENCOUNTER — Encounter (HOSPITAL_BASED_OUTPATIENT_CLINIC_OR_DEPARTMENT_OTHER): Payer: Self-pay

## 2021-12-13 ENCOUNTER — Emergency Department (HOSPITAL_BASED_OUTPATIENT_CLINIC_OR_DEPARTMENT_OTHER)
Admission: EM | Admit: 2021-12-13 | Discharge: 2021-12-13 | Disposition: A | Discharge status: Home / Self Care | Payer: 59 | Attending: Emergency Medicine | Admitting: Emergency Medicine

## 2021-12-13 ENCOUNTER — Other Ambulatory Visit: Payer: Self-pay

## 2021-12-13 DIAGNOSIS — Z4800 Encounter for change or removal of nonsurgical wound dressing: Secondary | ICD-10-CM | POA: Insufficient documentation

## 2021-12-13 DIAGNOSIS — Z5189 Encounter for other specified aftercare: Secondary | ICD-10-CM

## 2021-12-13 NOTE — ED Triage Notes (Signed)
Pt was seen Thursday for staple removal to right lower leg laceration, prescribed abx and been taking them, sts that he just wants to make sure wound is not getting infected. Minimal drainage from site that was dressed at 1700 yesterday, denies pain, fevers, N/V. A&O x4, amb to room from lobby.

## 2021-12-13 NOTE — ED Provider Notes (Signed)
[facility] EMERGENCY DEPT Provider Note   CSN: [id number] Arrival date & time: 12/13/21  0545     History  Chief Complaint  Patient presents with   Wound Check    Edward Cabrera is a 61 y.o. male.  The history is provided by the patient.  Wound Check This is a recurrent problem. The current episode started more than 1 week ago. The problem occurs constantly. The problem has been gradually improving. Pertinent negatives include no chest pain, no abdominal pain, no headaches and no shortness of breath. Nothing aggravates the symptoms. Nothing relieves the symptoms. Treatments tried: keflex and bacitracin. The treatment provided moderate relief.       Home Medications Prior to Admission medications   Medication Sig Start Date End Date Taking? Authorizing Provider  acetaminophen (TYLENOL) 325 MG tablet Take 650 mg by mouth every 6 (six) hours as needed for mild pain.    [provider]  amLODipine (NORVASC) 10 MG tablet Take 1 tablet (10 mg total) by mouth daily. 11/01/21   [name], NP  atorvastatin (LIPITOR) 40 MG tablet Take 1 tablet (40 mg total) by mouth daily. 11/01/21   [name], NP  benzonatate (TESSALON) 100 MG capsule Take 1 capsule (100 mg total) by mouth 2 (two) times daily as needed for cough. Patient not taking: Reported on 11/15/2021 02/15/21   Perlie Mayo, NP  Blood Gluc Meter Disp-Strips (BLOOD GLUCOSE METER DISPOSABLE) DEVI 1 kit by Does not apply route 2 (two) times daily. 05/12/19   [name], NP  blood glucose meter kit and supplies Dispense based on patient and insurance preference. Use up to four times daily as directed. (FOR ICD-10 E10.9, E11.9). 05/12/19   [name], NP  cephALEXin (KEFLEX) 500 MG capsule Take 1 capsule (500 mg total) by mouth 4 (four) times daily. 12/08/21   Dorie Rank, MD  lisinopril-hydrochlorothiazide (ZESTORETIC) 10-12.5 MG tablet Take 1 tablet by mouth once daily 12/01/21    [name], NP  sildenafil (VIAGRA) 100 MG tablet Take 0.5 tablets (50 mg total) by mouth daily as needed for erectile dysfunction. 11/01/21   [name], NP  sitaGLIPtin-metformin (JANUMET) 50-1000 MG tablet Take 1 tablet by mouth 2 (two) times daily with a meal. 11/01/21   [name], NP      Allergies    Iohexol and Prednisone & diphenhydramine    Review of Systems   Review of Systems  Constitutional:  Negative for fever.  HENT:  Negative for facial swelling.   Respiratory:  Negative for shortness of breath.   Cardiovascular:  Negative for chest pain.  Gastrointestinal:  Negative for abdominal pain.  Skin:  Positive for wound.  Neurological:  Negative for headaches.  All other systems reviewed and are negative.   Physical Exam Updated Vital Signs BP (!) 176/99   Pulse 88   Temp 97.8 F (36.6 C) (Oral)   Ht 5' 8" (1.727 m)   Wt 98.4 kg   SpO2 97%   BMI 32.99 kg/m  Physical Exam Vitals and nursing note reviewed.  Constitutional:      General: He is not in acute distress.    Appearance: He is well-developed. He is not diaphoretic.  HENT:     Head: Normocephalic and atraumatic.     Nose: Nose normal.  Eyes:     Conjunctiva/sclera: Conjunctivae normal.     Pupils: Pupils are equal, round, and reactive to light.  Cardiovascular:  Rate and Rhythm: Normal rate and regular rhythm.  Pulmonary:     Effort: Pulmonary effort is normal.     Breath sounds: Normal breath sounds. No wheezing or rales.  Abdominal:     General: Bowel sounds are normal.     Palpations: Abdomen is soft.     Tenderness: There is no abdominal tenderness. There is no guarding or rebound.  Musculoskeletal:        General: Normal range of motion.     Cervical back: Normal range of motion and neck supple.  Skin:    General: Skin is warm and dry.     Capillary Refill: Capillary refill takes less than 2 seconds.       Neurological:     General: No focal deficit present.      Mental Status: He is alert and oriented to person, place, and time.     ED Results / Procedures / Treatments   Labs (all labs ordered are listed, but only abnormal results are displayed) Labs Reviewed - No data to display  EKG None  Radiology No results found.  Procedures Procedures    Medications Ordered in ED Medications - No data to display  ED Course/ Medical Decision Making/ A&P                           Medical Decision Making Wound check follow up of suture removal  Amount and/or Complexity of Data Reviewed External Data Reviewed: notes.    Details: Previous notes reviewed   Risk Risk Details: Left wound be open to air or only breathable gauze to cover.  Wound care provided in the ED.  The wound is not infected.  No additional antibiotics are necessary.  Stable for discharge.  Strict return.      Final Clinical Impression(s) / ED Diagnoses Final diagnoses:  None   Return for intractable cough, coughing up blood, fevers > 100.4 unrelieved by medication, shortness of breath, intractable vomiting, chest pain, shortness of breath, weakness, numbness, changes in speech, facial asymmetry, abdominal pain, passing out, Inability to tolerate liquids or food, cough, altered mental status or any concerns. No signs of systemic illness or infection. The patient is nontoxic-appearing on exam and vital signs are within normal limits.  I have reviewed the triage vital signs and the nursing notes. Pertinent labs & imaging results that were available during my care of the patient were reviewed by me and considered in my medical decision making (see chart for details). After history, exam, and medical workup I feel the patient has been appropriately medically screened and is safe for discharge home. Pertinent diagnoses were discussed with the patient. Patient was given return precautions.  Rx / DC Orders ED Discharge Orders     None         Theodosia Bahena, MD 12/13/21  7915

## 2022-02-15 ENCOUNTER — Ambulatory Visit (INDEPENDENT_AMBULATORY_CARE_PROVIDER_SITE_OTHER): Payer: 59 | Admitting: Primary Care

## 2022-02-27 ENCOUNTER — Ambulatory Visit (INDEPENDENT_AMBULATORY_CARE_PROVIDER_SITE_OTHER): Payer: 59 | Admitting: Primary Care

## 2022-03-15 ENCOUNTER — Telehealth (INDEPENDENT_AMBULATORY_CARE_PROVIDER_SITE_OTHER): Payer: Self-pay | Admitting: Primary Care

## 2022-03-15 ENCOUNTER — Ambulatory Visit (INDEPENDENT_AMBULATORY_CARE_PROVIDER_SITE_OTHER): Payer: 59 | Admitting: Primary Care

## 2022-03-15 NOTE — Telephone Encounter (Signed)
Attempted to reach patient to inform him he needed to see Sharyn Lull. However since he is coming in for lab work his visit can be virtual.

## 2022-03-16 ENCOUNTER — Ambulatory Visit (INDEPENDENT_AMBULATORY_CARE_PROVIDER_SITE_OTHER): Payer: 59

## 2022-03-16 DIAGNOSIS — E119 Type 2 diabetes mellitus without complications: Secondary | ICD-10-CM

## 2022-03-16 DIAGNOSIS — I10 Essential (primary) hypertension: Secondary | ICD-10-CM

## 2022-03-16 DIAGNOSIS — E782 Mixed hyperlipidemia: Secondary | ICD-10-CM

## 2022-03-19 LAB — CBC WITH DIFFERENTIAL/PLATELET
Basophils Absolute: 0 10*3/uL (ref 0.0–0.2)
Basos: 0 %
EOS (ABSOLUTE): 0 10*3/uL (ref 0.0–0.4)
Eos: 1 %
Hematocrit: 46.1 % (ref 37.5–51.0)
Hemoglobin: 14.6 g/dL (ref 13.0–17.7)
Immature Grans (Abs): 0 10*3/uL (ref 0.0–0.1)
Immature Granulocytes: 0 %
Lymphocytes Absolute: 3.1 10*3/uL (ref 0.7–3.1)
Lymphs: 54 %
MCH: 27 pg (ref 26.6–33.0)
MCHC: 31.7 g/dL (ref 31.5–35.7)
MCV: 85 fL (ref 79–97)
Monocytes Absolute: 0.4 10*3/uL (ref 0.1–0.9)
Monocytes: 7 %
Neutrophils Absolute: 2.2 10*3/uL (ref 1.4–7.0)
Neutrophils: 38 %
Platelets: 262 10*3/uL (ref 150–450)
RBC: 5.4 x10E6/uL (ref 4.14–5.80)
RDW: 14.1 % (ref 11.6–15.4)
WBC: 5.7 10*3/uL (ref 3.4–10.8)

## 2022-03-19 LAB — HEMOGLOBIN A1C
Est. average glucose Bld gHb Est-mCnc: 192 mg/dL
Hgb A1c MFr Bld: 8.3 % — ABNORMAL HIGH (ref 4.8–5.6)

## 2022-03-19 LAB — LIPID PANEL
Chol/HDL Ratio: 5.9 ratio — ABNORMAL HIGH (ref 0.0–5.0)
Cholesterol, Total: 232 mg/dL — ABNORMAL HIGH (ref 100–199)
HDL: 39 mg/dL — ABNORMAL LOW (ref >39)
LDL Chol Calc (NIH): 175 mg/dL — ABNORMAL HIGH (ref 0–99)
Triglycerides: 99 mg/dL (ref 0–149)
VLDL Cholesterol Cal: 18 mg/dL (ref 5–40)

## 2022-03-19 LAB — CMP14+EGFR
ALT: 26 IU/L (ref 0–44)
AST: 26 IU/L (ref 0–40)
Albumin/Globulin Ratio: 1.6 (ref 1.2–2.2)
Albumin: 4.6 g/dL (ref 3.8–4.9)
Alkaline Phosphatase: 85 IU/L (ref 44–121)
BUN/Creatinine Ratio: 9 (ref 9–20)
BUN: 15 mg/dL (ref 6–24)
Bilirubin Total: 0.5 mg/dL (ref 0.0–1.2)
CO2: 20 mmol/L (ref 20–29)
Calcium: 10.1 mg/dL (ref 8.7–10.2)
Chloride: 101 mmol/L (ref 96–106)
Creatinine, Ser: 1.61 mg/dL — ABNORMAL HIGH (ref 0.76–1.27)
Globulin, Total: 2.9 g/dL (ref 1.5–4.5)
Glucose: 87 mg/dL (ref 70–99)
Potassium: 4.1 mmol/L (ref 3.5–5.2)
Sodium: 144 mmol/L (ref 134–144)
Total Protein: 7.5 g/dL (ref 6.0–8.5)
eGFR: 49 mL/min/{1.73_m2} — ABNORMAL LOW (ref >59)

## 2022-03-19 LAB — MICROALBUMIN / CREATININE URINE RATIO
Creatinine, Urine: 394.9 mg/dL
Microalb/Creat Ratio: 70 mg/g creat — ABNORMAL HIGH (ref 0–29)
Microalbumin, Urine: 274.9 ug/mL

## 2022-03-25 ENCOUNTER — Other Ambulatory Visit (INDEPENDENT_AMBULATORY_CARE_PROVIDER_SITE_OTHER): Payer: Self-pay | Admitting: Primary Care

## 2022-03-25 DIAGNOSIS — E782 Mixed hyperlipidemia: Secondary | ICD-10-CM

## 2022-03-25 DIAGNOSIS — Z760 Encounter for issue of repeat prescription: Secondary | ICD-10-CM

## 2022-03-25 DIAGNOSIS — E6609 Other obesity due to excess calories: Secondary | ICD-10-CM

## 2022-03-25 DIAGNOSIS — I10 Essential (primary) hypertension: Secondary | ICD-10-CM

## 2022-03-25 MED ORDER — JANUMET 50-1000 MG PO TABS: 1.0000 | ORAL_TABLET | Freq: Two times a day (BID) | ORAL | 1 refills | Status: DC

## 2022-03-25 MED ORDER — LISINOPRIL-HYDROCHLOROTHIAZIDE 10-12.5 MG PO TABS: 1.0000 | ORAL_TABLET | Freq: Every day | ORAL | 1 refills | Status: DC

## 2022-03-25 MED ORDER — ATORVASTATIN CALCIUM 80 MG PO TABS: 80.0000 mg | ORAL_TABLET | Freq: Every day | ORAL | 1 refills | Status: DC

## 2022-03-25 MED ORDER — AMLODIPINE BESYLATE 10 MG PO TABS: 10.0000 mg | ORAL_TABLET | Freq: Every day | ORAL | 1 refills | Status: DC

## 2022-03-29 ENCOUNTER — Other Ambulatory Visit (INDEPENDENT_AMBULATORY_CARE_PROVIDER_SITE_OTHER): Payer: Self-pay | Admitting: Primary Care

## 2022-03-29 DIAGNOSIS — I10 Essential (primary) hypertension: Secondary | ICD-10-CM

## 2022-03-29 NOTE — Telephone Encounter (Signed)
Medication Refill - Medication: lisinopril-hydrochlorothiazide (ZESTORETIC) 10-12.5 MG tablet   Has the patient contacted their pharmacy? Yes.   Pharmacy advise he had no more refills and he took his last pill this today  Preferred Pharmacy (with phone number or street name):  [facility] ([place]), [place] - [address] Phone: [phone]  Fax: [phone]     Has the patient been seen for an appointment in the last year OR does the patient have an upcoming appointment? No.

## 2022-03-30 NOTE — Telephone Encounter (Signed)
Pt. Had sent in a message that he took his last Zestoretic today and the pharmacy told him he didn't have any more refills on it. I called the [facility] [phone] and they have it ready for him to pick up.   It's been ready for 5 days.   It was sent in on 03/25/2022 by Juluis Mire, NP.     I called the pt and let him know his rx was ready for pick up.     So I have refused this duplicate request.

## 2022-09-13 ENCOUNTER — Encounter (INDEPENDENT_AMBULATORY_CARE_PROVIDER_SITE_OTHER): Payer: Self-pay | Admitting: Primary Care

## 2022-09-13 ENCOUNTER — Ambulatory Visit (INDEPENDENT_AMBULATORY_CARE_PROVIDER_SITE_OTHER): Payer: 59 | Admitting: Primary Care

## 2022-09-13 VITALS — BP 151/92 | HR 76 | Resp 16 | Wt 229.0 lb

## 2022-09-13 DIAGNOSIS — Z6834 Body mass index (BMI) 34.0-34.9, adult: Secondary | ICD-10-CM

## 2022-09-13 DIAGNOSIS — Z760 Encounter for issue of repeat prescription: Secondary | ICD-10-CM

## 2022-09-13 DIAGNOSIS — E119 Type 2 diabetes mellitus without complications: Secondary | ICD-10-CM

## 2022-09-13 DIAGNOSIS — E782 Mixed hyperlipidemia: Secondary | ICD-10-CM | POA: Diagnosis not present

## 2022-09-13 DIAGNOSIS — Z7984 Long term (current) use of oral hypoglycemic drugs: Secondary | ICD-10-CM

## 2022-09-13 DIAGNOSIS — I10 Essential (primary) hypertension: Secondary | ICD-10-CM | POA: Diagnosis not present

## 2022-09-13 DIAGNOSIS — F5221 Male erectile disorder: Secondary | ICD-10-CM

## 2022-09-13 DIAGNOSIS — E6609 Other obesity due to excess calories: Secondary | ICD-10-CM

## 2022-09-13 LAB — POCT GLYCOSYLATED HEMOGLOBIN (HGB A1C): HbA1c, POC (controlled diabetic range): 8 % — AB (ref 0.0–7.0)

## 2022-09-13 MED ORDER — LISINOPRIL-HYDROCHLOROTHIAZIDE 10-12.5 MG PO TABS: 1.0000 | ORAL_TABLET | Freq: Every day | ORAL | 1 refills | Status: DC

## 2022-09-13 MED ORDER — JANUMET 50-1000 MG PO TABS: 1.0000 | ORAL_TABLET | Freq: Two times a day (BID) | ORAL | 1 refills | Status: DC

## 2022-09-13 MED ORDER — AMLODIPINE BESYLATE 10 MG PO TABS: 10.0000 mg | ORAL_TABLET | Freq: Every day | ORAL | 1 refills | Status: DC

## 2022-09-13 MED ORDER — SILDENAFIL CITRATE 100 MG PO TABS: 50.0000 mg | ORAL_TABLET | Freq: Every day | ORAL | 3 refills | Status: DC | PRN

## 2022-09-13 NOTE — Progress Notes (Signed)
[facility]  Edward Cabrera, is a 62 y.o. male  [id number]  [id number]  DOB - 1960/10/27  Chief Complaint  Patient presents with   Diabetes   Hypertension    Didn't take medication today        Subjective:   Edward Cabrera is a 62 y.o. male here today for a follow up visit for the management of HTN- did not take medication today. He admits to forgetting to take them sometimes ate unhealthy today. Patient has No headache, No chest pain, No abdominal pain - No Nausea, No new weakness tingling or numbness, No Cough - shortness of breath . T2D- Denies polyuria, polydipsia, polyphasia or vision changes.  Does not check blood sugars at home.  Take Bp at home  Systolic 120- 140  Diastolic 80-95   No problems updated.  Allergies  Allergen Reactions   Iohexol      Code: HIVES, Desc: PT. WAS GIVEN 50 MG BENADRLY IV 30 MINUTES PRE-PROCEDURE W/ NO REACTION-----ARS 10-21-05, Onset Date: [id number]    Prednisone & Diphenhydramine Other (See Comments)    Past Medical History:  Diagnosis Date   Diabetes mellitus without complication (HCC)    Gout    Hypertension    Pulmonary embolism (HCC)     Current Outpatient Medications on File Prior to Visit  Medication Sig Dispense Refill   acetaminophen (TYLENOL) 325 MG tablet Take 650 mg by mouth every 6 (six) hours as needed for mild pain.     atorvastatin (LIPITOR) 80 MG tablet Take 1 tablet (80 mg total) by mouth daily. 90 tablet 1   Blood Gluc Meter Disp-Strips (BLOOD GLUCOSE METER DISPOSABLE) DEVI 1 kit by Does not apply route 2 (two) times daily. 1 Bag 0   blood glucose meter kit and supplies Dispense based on patient and insurance preference. Use up to four times daily as directed. (FOR ICD-10 E10.9, E11.9). 1 each 0   cephALEXin (KEFLEX) 500 MG capsule Take 1 capsule (500 mg total) by mouth 4 (four) times daily. (Patient not taking: Reported on 09/13/2022) 28 capsule 0   No current facility-administered medications  on file prior to visit.    Objective:   Vitals:   09/13/22 1500 09/13/22 1501  BP: (!) 170/120 (!) 151/92  Pulse: 76   Resp: 16   SpO2: 99%   Weight: 229 lb (103.9 kg)     Comprehensive ROS Pertinent positive and negative noted in HPI   Exam General appearance : Awake, alert, not in any distress. Speech Clear. Not toxic looking HEENT: Atraumatic and Normocephalic, pupils equally reactive to light and accomodation Neck: Supple, no JVD. No cervical lymphadenopathy.  Chest: Good air entry bilaterally, no added sounds  CVS: S1 S2 regular, no murmurs.  Abdomen: Bowel sounds present, Non tender and not distended with no gaurding, rigidity or rebound. Extremities: B/L Lower Ext shows no edema, both legs are warm to touch Neurology: Awake alert, and oriented X 3,  Non focal Skin: No Rash  Data Review Lab Results  Component Value Date   HGBA1C 8.0 (A) 09/13/2022   HGBA1C 8.3 (H) 03/16/2022   HGBA1C 8.2 (A) 11/15/2021    Assessment & Plan   Edward Cabrera was seen today for diabetes and hypertension.  Diagnoses and all orders for this visit:  Type 2 diabetes mellitus without complication, without long-term current use of insulin (HCC) -     POCT glycosylated hemoglobin (Hb A1C) - educated on lifestyle modifications, including but not limited to diet choices and  adding exercise to daily routine.    Mixed hyperlipidemia Your cholesterol is high, Increase risk of heart attack and/or stroke.  To reduce your Cholesterol , Remember - more fruits and vegetables, more fish, and limit red meat and dairy products.  More soy, nuts, beans, barley, lentils, oats and plant sterol ester enriched margarine instead of butter.  I also encourage eliminating sugar and processed food.   Lipids  Class 1 obesity due to excess calories without serious comorbidity with body mass index (BMI) of 34.0 to 34.9 in adult Obesity is 30-39 indicating an excess in caloric intake or underlining conditions. This may  lead to other co-morbidities. Educated on lifestyle modifications of diet and exercise which may reduce obesity.    Essential hypertension  BP goal - < 130/80 Explained that having normal blood pressure is the goal and medications are helping to get to goal and maintain normal blood pressure. DIET: Limit salt intake, read nutrition labels to check salt content, limit fried and high fatty foods  Avoid using multisymptom OTC cold preparations that generally contain sudafed which can rise BP. Consult with pharmacist on best cold relief products to use for persons with HTN EXERCISE Discussed incorporating exercise such as walking - 30 minutes most days of the week and can do in 10 minute intervals     Erectile disorder, generalized, mild   Patient have been counseled extensively about nutrition and exercise. Other issues discussed during this visit include: low cholesterol diet, weight control and daily exercise, foot care, annual eye examinations at Ophthalmology, importance of adherence with medications and regular follow-up. We also discussed long term complications of uncontrolled diabetes and hypertension.   Return in about 3 months (around 12/13/2022) for medical conditions.  The patient was given clear instructions to go to ER or return to medical center if symptoms don't improve, worsen or new problems develop. The patient verbalized understanding. The patient was told to call to get lab results if they haven't heard anything in the next week.   This note has been created with Education officer, environmental. Any transcriptional errors are unintentional.   Grayce Sessions, NP 09/13/2022, 3:30 PM

## 2023-03-28 ENCOUNTER — Ambulatory Visit (INDEPENDENT_AMBULATORY_CARE_PROVIDER_SITE_OTHER): Admitting: Primary Care

## 2023-05-18 ENCOUNTER — Telehealth (INDEPENDENT_AMBULATORY_CARE_PROVIDER_SITE_OTHER): Payer: Self-pay | Admitting: Primary Care

## 2023-05-18 NOTE — Telephone Encounter (Signed)
 Called pt to confirm appt. Pt did not answer and LVM

## 2023-05-21 ENCOUNTER — Ambulatory Visit (INDEPENDENT_AMBULATORY_CARE_PROVIDER_SITE_OTHER): Admitting: Primary Care

## 2023-05-21 ENCOUNTER — Encounter (INDEPENDENT_AMBULATORY_CARE_PROVIDER_SITE_OTHER): Payer: Self-pay | Admitting: Primary Care

## 2023-05-21 VITALS — BP 150/110 | Resp 16 | Wt 223.6 lb

## 2023-05-21 DIAGNOSIS — I10 Essential (primary) hypertension: Secondary | ICD-10-CM

## 2023-05-21 DIAGNOSIS — E782 Mixed hyperlipidemia: Secondary | ICD-10-CM | POA: Diagnosis not present

## 2023-05-21 DIAGNOSIS — F5221 Male erectile disorder: Secondary | ICD-10-CM

## 2023-05-21 DIAGNOSIS — Z760 Encounter for issue of repeat prescription: Secondary | ICD-10-CM

## 2023-05-21 DIAGNOSIS — E119 Type 2 diabetes mellitus without complications: Secondary | ICD-10-CM

## 2023-05-21 DIAGNOSIS — M79601 Pain in right arm: Secondary | ICD-10-CM

## 2023-05-21 LAB — POCT GLYCOSYLATED HEMOGLOBIN (HGB A1C): HbA1c, POC (controlled diabetic range): 9 % — AB (ref 0.0–7.0)

## 2023-05-21 MED ORDER — LISINOPRIL-HYDROCHLOROTHIAZIDE 10-12.5 MG PO TABS: 1.0000 | ORAL_TABLET | Freq: Every day | ORAL | 1 refills | Status: DC

## 2023-05-21 MED ORDER — AMLODIPINE BESYLATE 10 MG PO TABS: 10.0000 mg | ORAL_TABLET | Freq: Every day | ORAL | 1 refills | Status: DC

## 2023-05-21 NOTE — Progress Notes (Signed)
 [facility]  Edward Cabrera, is a 63 y.o. male  [id number]  [id number]  DOB - Feb 03, 1960  Chief Complaint  Patient presents with   Shoulder Pain    Can't lift his shoulder without pain    Hypertension    Pt has had bp medication in about the week .       Subjective:   Edward Cabrera is a 63 y.o. male here today for an acute visit. Shoulder pain hurts to lift arm and Bp has been elevated out of medications 2 weeks . Patient has No headache, No chest pain, No abdominal pain - No Nausea, No new weakness tingling or numbness, No Cough - shortness of breath. This can also be related to his pain.    No problems updated.  Comprehensive ROS Pertinent positive and negative noted in HPI   Allergies  Allergen Reactions   Iohexol      Code: HIVES, Desc: PT. WAS GIVEN 50 MG BENADRLY IV 30 MINUTES PRE-PROCEDURE W/ NO REACTION-----ARS 10-21-05, Onset Date: [id number]    Prednisone  & Diphenhydramine Other (See Comments)    Past Medical History:  Diagnosis Date   Diabetes mellitus without complication (HCC)    Gout    Hypertension    Pulmonary embolism (HCC)     Current Outpatient Medications on File Prior to Visit  Medication Sig Dispense Refill   acetaminophen  (TYLENOL ) 325 MG tablet Take 650 mg by mouth every 6 (six) hours as needed for mild pain.     amLODipine  (NORVASC ) 10 MG tablet Take 1 tablet (10 mg total) by mouth daily. 90 tablet 1   Blood Gluc Meter Disp-Strips (BLOOD GLUCOSE METER DISPOSABLE) DEVI 1 kit by Does not apply route 2 (two) times daily. 1 Bag 0   blood glucose meter kit and supplies Dispense based on patient and insurance preference. Use up to four times daily as directed. (FOR ICD-10 E10.9, E11.9). 1 each 0   lisinopril -hydrochlorothiazide  (ZESTORETIC ) 10-12.5 MG tablet Take 1 tablet by mouth daily. 90 tablet 1   atorvastatin  (LIPITOR) 80 MG tablet Take 1 tablet (80 mg total) by mouth daily. 90 tablet 1   cephALEXin  (KEFLEX ) 500 MG capsule  Take 1 capsule (500 mg total) by mouth 4 (four) times daily. (Patient not taking: Reported on 05/21/2023) 28 capsule 0   sildenafil  (VIAGRA ) 100 MG tablet Take 0.5 tablets (50 mg total) by mouth daily as needed for erectile dysfunction. (Patient not taking: Reported on 05/21/2023) 10 tablet 3   sitaGLIPtin-metformin (JANUMET ) 50-1000 MG tablet Take 1 tablet by mouth 2 (two) times daily with a meal. (Patient not taking: Reported on 05/21/2023) 180 tablet 1   No current facility-administered medications on file prior to visit.   Health Maintenance  Topic Date Due   Pneumococcal Vaccination (1 of 2 - PCV) Never done   Zoster (Shingles) Vaccine (2 of 2) 04/06/2021   COVID-19 Vaccine (3 - 2024-25 season) 09/03/2022   Yearly kidney function blood test for diabetes  03/16/2023   Yearly kidney health urinalysis for diabetes  03/16/2023   Flu Shot  08/03/2023   Colon Cancer Screening  07/17/2030   DTaP/Tdap/Td vaccine (2 - Td or Tdap) 08/12/2030   Hepatitis C Screening  Completed   HIV Screening  Completed   HPV Vaccine  Aged Out   Meningitis B Vaccine  Aged Out    Objective:  BP (!) 148/98 (Cuff Size: Large)   Resp 16   Wt 223 lb 9.6 oz (101.4 kg)   SpO2  97%   BMI 34.00 kg/m   Vitals:   05/21/23 1415 05/21/23 1418 05/21/23 1442  BP: (!) 171/114 (!) 151/114 (!) 148/98  Resp:  16   SpO2:  97%   Weight:  223 lb 9.6 oz (101.4 kg)    BP Readings from Last 3 Encounters:  05/21/23 (!) 148/98  09/13/22 (!) 151/92  12/13/21 (!) 176/99    Physical Exam Vitals reviewed.  Constitutional:      Appearance: He is obese.  HENT:     Head: Normocephalic.     Right Ear: Tympanic membrane and external ear normal.     Left Ear: Tympanic membrane and external ear normal.     Nose: Nose normal.  Eyes:     Extraocular Movements: Extraocular movements intact.     Pupils: Pupils are equal, round, and reactive to light.  Cardiovascular:     Rate and Rhythm: Normal rate and regular rhythm.   Pulmonary:     Effort: Pulmonary effort is normal.     Breath sounds: Normal breath sounds.  Abdominal:     General: Bowel sounds are normal. There is distension.     Palpations: Abdomen is soft.  Musculoskeletal:        General: Normal range of motion.     Cervical back: Normal range of motion and neck supple.  Skin:    General: Skin is warm and dry.  Neurological:     Mental Status: He is oriented to person, place, and time.  Psychiatric:        Mood and Affect: Mood normal.        Behavior: Behavior normal.        Thought Content: Thought content normal.        Judgment: Judgment normal.     Assessment & Plan  Cleophas was seen today for shoulder pain, hypertension and diabetes.  Diagnoses and all orders for this visit:  Essential hypertension BP goal - < 140/90Explained that having normal blood pressure is the goal and medications are helping to get to goal and maintain normal blood pressure. DIET: Limit salt intake, read nutrition labels to check salt content, limit fried and high fatty foods  Avoid using multisymptom OTC cold preparations that generally contain sudafed which can rise BP. Consult with pharmacist on best cold relief products to use for persons with HTN EXERCISE Discussed incorporating exercise such as walking - 30 minutes most days of the week and can do in 10 minute intervals    -     CMP14+EGFR Rebounded clonidine  .2mg   Right arm pain -     AMB referral to orthopedics  Mixed hyperlipidemia -     Lipid panel  Type 2 diabetes mellitus without complication, without long-term current use of insulin (HCC) -     POCT glycosylated hemoglobin (Hb A1C) -     Microalbumin / creatinine urine ratio -     CBC with Differential/Platelet Patient have been counseled extensively about nutrition and exercise. Other issues discussed during this visit include: low cholesterol diet, weight control and daily exercise, foot care, annual eye examinations at Ophthalmology,  importance of adherence with medications and regular follow-up. We also discussed long term complications of uncontrolled diabetes and hypertension.   Return in about 3 weeks (around 06/11/2023) for re-check blood pressure.  The patient was given clear instructions to go to ER or return to medical center if symptoms don't improve, worsen or new problems develop. The patient verbalized understanding. The patient was told  to call to get lab results if they haven't heard anything in the next week.   This note has been created with Education officer, environmental. Any transcriptional errors are unintentional.   Marius Siemens, NP 05/21/2023, 2:43 PM

## 2023-05-22 LAB — CBC WITH DIFFERENTIAL/PLATELET
Basophils Absolute: 0 10*3/uL (ref 0.0–0.2)
Basos: 0 %
EOS (ABSOLUTE): 0 10*3/uL (ref 0.0–0.4)
Eos: 1 %
Hematocrit: 46.4 % (ref 37.5–51.0)
Hemoglobin: 14.9 g/dL (ref 13.0–17.7)
Immature Grans (Abs): 0 10*3/uL (ref 0.0–0.1)
Immature Granulocytes: 0 %
Lymphocytes Absolute: 2.3 10*3/uL (ref 0.7–3.1)
Lymphs: 45 %
MCH: 27.5 pg (ref 26.6–33.0)
MCHC: 32.1 g/dL (ref 31.5–35.7)
MCV: 86 fL (ref 79–97)
Monocytes Absolute: 0.4 10*3/uL (ref 0.1–0.9)
Monocytes: 8 %
Neutrophils Absolute: 2.3 10*3/uL (ref 1.4–7.0)
Neutrophils: 46 %
Platelets: 225 10*3/uL (ref 150–450)
RBC: 5.42 x10E6/uL (ref 4.14–5.80)
RDW: 13.2 % (ref 11.6–15.4)
WBC: 5.1 10*3/uL (ref 3.4–10.8)

## 2023-05-22 LAB — CMP14+EGFR
ALT: 31 IU/L (ref 0–44)
AST: 23 IU/L (ref 0–40)
Albumin: 4.1 g/dL (ref 3.8–4.9)
Alkaline Phosphatase: 101 IU/L (ref 44–121)
BUN/Creatinine Ratio: 8 — ABNORMAL LOW (ref 10–24)
BUN: 13 mg/dL (ref 8–27)
Bilirubin Total: 0.4 mg/dL (ref 0.0–1.2)
CO2: 18 mmol/L — ABNORMAL LOW (ref 20–29)
Calcium: 9.7 mg/dL (ref 8.6–10.2)
Chloride: 103 mmol/L (ref 96–106)
Creatinine, Ser: 1.6 mg/dL — ABNORMAL HIGH (ref 0.76–1.27)
Globulin, Total: 2.7 g/dL (ref 1.5–4.5)
Glucose: 168 mg/dL — ABNORMAL HIGH (ref 70–99)
Potassium: 3.8 mmol/L (ref 3.5–5.2)
Sodium: 140 mmol/L (ref 134–144)
Total Protein: 6.8 g/dL (ref 6.0–8.5)
eGFR: 49 mL/min/{1.73_m2} — ABNORMAL LOW (ref >59)

## 2023-05-22 LAB — LIPID PANEL
Chol/HDL Ratio: 6 ratio — ABNORMAL HIGH (ref 0.0–5.0)
Cholesterol, Total: 229 mg/dL — ABNORMAL HIGH (ref 100–199)
HDL: 38 mg/dL — ABNORMAL LOW (ref >39)
LDL Chol Calc (NIH): 164 mg/dL — ABNORMAL HIGH (ref 0–99)
Triglycerides: 145 mg/dL (ref 0–149)
VLDL Cholesterol Cal: 27 mg/dL (ref 5–40)

## 2023-05-22 LAB — MICROALBUMIN / CREATININE URINE RATIO

## 2023-05-23 ENCOUNTER — Telehealth (INDEPENDENT_AMBULATORY_CARE_PROVIDER_SITE_OTHER): Payer: Self-pay | Admitting: Pharmacist

## 2023-05-23 ENCOUNTER — Telehealth (INDEPENDENT_AMBULATORY_CARE_PROVIDER_SITE_OTHER): Payer: Self-pay

## 2023-05-23 NOTE — Telephone Encounter (Signed)
 Called pt to schedule with [facility]. Please schedule if pt calls back

## 2023-05-23 NOTE — Telephone Encounter (Signed)
 Copied from CRM [id number]. Topic: Clinical - Medication Question >> May 23, 2023  1:22 PM Yolanda T wrote: Reason for CRM: patient called to schedule wth [facility], unable to schedule that appt. Please f/u with patient

## 2023-05-29 ENCOUNTER — Ambulatory Visit: Admitting: Physician Assistant

## 2023-05-30 ENCOUNTER — Ambulatory Visit: Admitting: Physician Assistant

## 2023-06-05 ENCOUNTER — Ambulatory Visit: Admitting: Physician Assistant

## 2023-06-05 ENCOUNTER — Encounter (HOSPITAL_COMMUNITY): Payer: Self-pay | Admitting: Emergency Medicine

## 2023-06-05 ENCOUNTER — Encounter: Payer: Self-pay | Admitting: Physician Assistant

## 2023-06-05 ENCOUNTER — Inpatient Hospital Stay (HOSPITAL_COMMUNITY)
Admission: EM | Admit: 2023-06-05 | Discharge: 2023-06-07 | DRG: 064 | Disposition: A | Discharge status: Home / Self Care | Attending: Internal Medicine | Admitting: Internal Medicine

## 2023-06-05 ENCOUNTER — Emergency Department (HOSPITAL_COMMUNITY)

## 2023-06-05 ENCOUNTER — Other Ambulatory Visit: Payer: Self-pay

## 2023-06-05 DIAGNOSIS — R29898 Other symptoms and signs involving the musculoskeletal system: Secondary | ICD-10-CM | POA: Diagnosis not present

## 2023-06-05 DIAGNOSIS — Z91041 Radiographic dye allergy status: Secondary | ICD-10-CM

## 2023-06-05 DIAGNOSIS — Z823 Family history of stroke: Secondary | ICD-10-CM

## 2023-06-05 DIAGNOSIS — I1310 Hypertensive heart and chronic kidney disease without heart failure, with stage 1 through stage 4 chronic kidney disease, or unspecified chronic kidney disease: Secondary | ICD-10-CM | POA: Diagnosis present

## 2023-06-05 DIAGNOSIS — I10 Essential (primary) hypertension: Secondary | ICD-10-CM | POA: Insufficient documentation

## 2023-06-05 DIAGNOSIS — Z79899 Other long term (current) drug therapy: Secondary | ICD-10-CM

## 2023-06-05 DIAGNOSIS — R29703 NIHSS score 3: Secondary | ICD-10-CM | POA: Diagnosis present

## 2023-06-05 DIAGNOSIS — E785 Hyperlipidemia, unspecified: Secondary | ICD-10-CM | POA: Diagnosis present

## 2023-06-05 DIAGNOSIS — I63422 Cerebral infarction due to embolism of left anterior cerebral artery: Secondary | ICD-10-CM | POA: Diagnosis not present

## 2023-06-05 DIAGNOSIS — E669 Obesity, unspecified: Secondary | ICD-10-CM | POA: Diagnosis present

## 2023-06-05 DIAGNOSIS — G8929 Other chronic pain: Secondary | ICD-10-CM | POA: Diagnosis present

## 2023-06-05 DIAGNOSIS — Z7982 Long term (current) use of aspirin: Secondary | ICD-10-CM

## 2023-06-05 DIAGNOSIS — E1122 Type 2 diabetes mellitus with diabetic chronic kidney disease: Secondary | ICD-10-CM | POA: Diagnosis present

## 2023-06-05 DIAGNOSIS — I6522 Occlusion and stenosis of left carotid artery: Secondary | ICD-10-CM | POA: Diagnosis present

## 2023-06-05 DIAGNOSIS — I639 Cerebral infarction, unspecified: Secondary | ICD-10-CM | POA: Diagnosis not present

## 2023-06-05 DIAGNOSIS — Z86711 Personal history of pulmonary embolism: Secondary | ICD-10-CM

## 2023-06-05 DIAGNOSIS — R29810 Facial weakness: Secondary | ICD-10-CM | POA: Diagnosis present

## 2023-06-05 DIAGNOSIS — G936 Cerebral edema: Secondary | ICD-10-CM | POA: Diagnosis present

## 2023-06-05 DIAGNOSIS — G8191 Hemiplegia, unspecified affecting right dominant side: Secondary | ICD-10-CM | POA: Diagnosis present

## 2023-06-05 DIAGNOSIS — Z7902 Long term (current) use of antithrombotics/antiplatelets: Secondary | ICD-10-CM

## 2023-06-05 DIAGNOSIS — R1310 Dysphagia, unspecified: Secondary | ICD-10-CM | POA: Diagnosis present

## 2023-06-05 DIAGNOSIS — Z6834 Body mass index (BMI) 34.0-34.9, adult: Secondary | ICD-10-CM

## 2023-06-05 DIAGNOSIS — E66811 Obesity, class 1: Secondary | ICD-10-CM | POA: Insufficient documentation

## 2023-06-05 DIAGNOSIS — Z830 Family history of human immunodeficiency virus [HIV] disease: Secondary | ICD-10-CM

## 2023-06-05 DIAGNOSIS — E1165 Type 2 diabetes mellitus with hyperglycemia: Secondary | ICD-10-CM

## 2023-06-05 DIAGNOSIS — I618 Other nontraumatic intracerebral hemorrhage: Secondary | ICD-10-CM | POA: Diagnosis present

## 2023-06-05 DIAGNOSIS — Z833 Family history of diabetes mellitus: Secondary | ICD-10-CM

## 2023-06-05 DIAGNOSIS — N1832 Chronic kidney disease, stage 3b: Secondary | ICD-10-CM | POA: Diagnosis present

## 2023-06-05 LAB — PROTIME-INR
INR: 1 (ref 0.8–1.2)
Prothrombin Time: 13.8 s (ref 11.4–15.2)

## 2023-06-05 LAB — COMPREHENSIVE METABOLIC PANEL WITH GFR
ALT: 35 U/L (ref 0–44)
AST: 26 U/L (ref 15–41)
Albumin: 3.7 g/dL (ref 3.5–5.0)
Alkaline Phosphatase: 73 U/L (ref 38–126)
Anion gap: 10 (ref 5–15)
BUN: 13 mg/dL (ref 6–20)
CO2: 25 mmol/L (ref 22–32)
Calcium: 9.6 mg/dL (ref 8.9–10.3)
Chloride: 104 mmol/L (ref 98–111)
Creatinine, Ser: 1.6 mg/dL — ABNORMAL HIGH (ref 0.61–1.24)
GFR, Estimated: 49 mL/min — ABNORMAL LOW (ref >60)
Glucose, Bld: 219 mg/dL — ABNORMAL HIGH (ref 70–99)
Potassium: 3.5 mmol/L (ref 3.5–5.1)
Sodium: 139 mmol/L (ref 135–145)
Total Bilirubin: 0.7 mg/dL (ref 0.0–1.2)
Total Protein: 7.3 g/dL (ref 6.5–8.1)

## 2023-06-05 LAB — DIFFERENTIAL
Abs Immature Granulocytes: 0.01 10*3/uL (ref 0.00–0.07)
Basophils Absolute: 0 10*3/uL (ref 0.0–0.1)
Basophils Relative: 1 %
Eosinophils Absolute: 0 10*3/uL (ref 0.0–0.5)
Eosinophils Relative: 1 %
Immature Granulocytes: 0 %
Lymphocytes Relative: 50 %
Lymphs Abs: 2.9 10*3/uL (ref 0.7–4.0)
Monocytes Absolute: 0.5 10*3/uL (ref 0.1–1.0)
Monocytes Relative: 8 %
Neutro Abs: 2.3 10*3/uL (ref 1.7–7.7)
Neutrophils Relative %: 40 %

## 2023-06-05 LAB — CBC
HCT: 45.7 % (ref 39.0–52.0)
Hemoglobin: 15.1 g/dL (ref 13.0–17.0)
MCH: 27.1 pg (ref 26.0–34.0)
MCHC: 33 g/dL (ref 30.0–36.0)
MCV: 82 fL (ref 80.0–100.0)
Platelets: 235 10*3/uL (ref 150–400)
RBC: 5.57 MIL/uL (ref 4.22–5.81)
RDW: 12.5 % (ref 11.5–15.5)
WBC: 5.7 10*3/uL (ref 4.0–10.5)
nRBC: 0 % (ref 0.0–0.2)

## 2023-06-05 LAB — I-STAT CHEM 8, ED
BUN: 13 mg/dL (ref 6–20)
Calcium, Ion: 1.17 mmol/L (ref 1.15–1.40)
Chloride: 102 mmol/L (ref 98–111)
Creatinine, Ser: 1.7 mg/dL — ABNORMAL HIGH (ref 0.61–1.24)
Glucose, Bld: 223 mg/dL — ABNORMAL HIGH (ref 70–99)
HCT: 47 % (ref 39.0–52.0)
Hemoglobin: 16 g/dL (ref 13.0–17.0)
Potassium: 3.5 mmol/L (ref 3.5–5.1)
Sodium: 139 mmol/L (ref 135–145)
TCO2: 24 mmol/L (ref 22–32)

## 2023-06-05 LAB — ETHANOL: Alcohol, Ethyl (B): <15 mg/dL (ref <15)

## 2023-06-05 LAB — CBG MONITORING, ED
Glucose-Capillary: 221 mg/dL — ABNORMAL HIGH (ref 70–99)
Glucose-Capillary: 250 mg/dL — ABNORMAL HIGH (ref 70–99)

## 2023-06-05 LAB — APTT: aPTT: 22 s — ABNORMAL LOW (ref 24–36)

## 2023-06-05 MED ORDER — SODIUM CHLORIDE 0.9% FLUSH: 3.0000 mL | Freq: Once | INTRAVENOUS | Status: DC

## 2023-06-05 NOTE — Progress Notes (Unsigned)
 Office Visit Note   Patient: Edward Cabrera           Date of Birth: 07/08/1960           MRN: [id number] Visit Date: 06/05/2023              Requested by: Marius Siemens, NP [address] [place],  [place] [address] PCP: Marius Siemens, NP   Assessment & Plan: Visit Diagnoses:  1. Right arm weakness     Plan: Impression is significant right upper extremity weakness concerning for acute CVA.  Although the patient may have some underlying right shoulder pathology, I have urged him to head directly to the emergency department to rule out stroke.  He is agreeable to this plan.  He will follow-up for his right shoulder at some point in the future.  Follow-Up Instructions: Return if symptoms worsen or fail to improve.   Orders:  No orders of the defined types were placed in this encounter.  No orders of the defined types were placed in this encounter.     Procedures: No procedures performed   Clinical Data: No additional findings.   Subjective: Chief Complaint  Patient presents with   Right Shoulder - Injury    States his right shoulder popped several months ago at work    Right Hand - Numbness, Weakness    States one week ago woke up arm right tingling numb and now he can not move his right hand/ arm at all     HPI patient is a very pleasant 63 year old right-hand-dominant gentleman who comes in today sent by his PCP for evaluation of his right shoulder/arm.  He tells me that he is not actually having pain to the shoulder but rather significant and progressive weakness as well as paresthesias.  He notes that he has had years of intermittent paresthesias to the upper extremities after he has been sleeping as he sleeps in an awkward position.  He also tells me about a month ago he was lifting a garbage can and heard a pop to the anterior right shoulder.  He had some mild discomfort for a few weeks but improved thereafter.  He tells me about a week and a  half ago he noticed progressive weakness to the right upper extremity with associated paresthesias.  He also tells me that he has had 2 episodes of right sided facial numbness since this past Monday.  He has a history of provoked DVT with subsequent PE back in 2007.  Currently not on any anticoagulants.  He does not smoke cigarettes.  Of note, he denies any weakness or paresthesias to the left upper or bilateral lower extremities.  He does have a history of occasional and mild neck pain but nothing more.  Review of Systems as detailed in HPI.  All others reviewed and are negative.   Objective: Vital Signs: There were no vitals taken for this visit.  Physical Exam well-developed well-nourished gentleman in no acute distress.  He is alert and oriented x 3.  Ortho Exam right upper extremity exam: He is unable to forward flex, abduct, adduct, internally or externally rotate the shoulder.  He has minimal strength with elbow flexion and extension.  He is unable to extend or flex his wrist.  He is unable to flex, extend, adduct or abduct his fingers.  He is unable to hold his wrist in extension or flexion once I passively put him into this position.  Left  upper extremity and bilateral lower extremity has full strength.  He has full sensation throughout.  Specialty Comments:  No specialty comments available.  Imaging: MR ANGIO NECK W WO CONTRAST Result Date: 06/06/2023 EXAM: MRA Neck without and with contrast 06/06/2023 05:00:00 AM TECHNIQUE: Multiplanar multisequence MRA of the neck was performed without and with the administration of 10mL intravenous gadobutrol (GADAVIST) 1 MMOL/ML injection. COMPARISON: None available CLINICAL HISTORY: Neuro deficit, acute, stroke suspected. FINDINGS: CAROTID ARTERIES: The right carotid artery including the bifurcation is normal. Decreased flow is present in the left internal carotid artery. A high-grade stenosis is confirmed within the cavernous left ICA. No other  significant carotid stenosis are present in the neck. VERTEBRAL ARTERIES: The time of flight images demonstrate no significant antegrade flow within the right vertebral artery. Flow is antegrade in the left vertebral artery. The postcontrast images confirm occlusion of the right vertebral artery. The right V4 segment is reconstituted with a high-grade distal stenosis at the vertebral basilar junction. The left vertebral artery and the basilar artery are normal. IMPRESSION: 1. 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 . 2. 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 High-grade tandem distal left vertebral artery stenosis at the vertebral basilar junction. Electronically signed by: Audree Leas MD 06/06/2023 05:42 AM EDT RP Workstation: [id number]   MR ANGIO HEAD WO CONTRAST Result Date: 06/06/2023 EXAM: MR Angiography Head without intravenous Contrast. 06/06/2023 05:01:04 AM TECHNIQUE: Magnetic resonance angiography images of the head without intravenous contrast. Three-dimensional MIP reformations performed. COMPARISON: MR head without contrast 06/05/2023. CLINICAL HISTORY: Neuro deficit, acute, stroke suspected. 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 FINDINGS: ANTERIOR CIRCULATION: Focal signal loss within the cavernous left ICA suggests a high-grade stenosis. Decreased signal is present in the more distal left ICA and MCA branch vessels without other focal stenosis. Decreased signal is present in the left A1 segment. Normal signal is present in the A2 segments bilaterally. POSTERIOR CIRCULATION: The distal vertebral arteries are not imaged. A dominant right iliac vessel is present. The basilar artery is normal. The branch vessels are within normal limits bilaterally. IMPRESSION: 1. Focal signal loss within the cavernous left ICA suggests a high-grade stenosis. 2. Decreased  signal in the more distal left ICA, MCA branch vessels, and left A1 segment without other focal stenosis. Most likely reflects decreased flow in these vessels secondary to the more proximal stenosis. Electronically signed by: Audree Leas MD 06/06/2023 05:36 AM EDT RP Workstation: [id number]   MR Cervical Spine Wo Contrast Result Date: 06/05/2023 CLINICAL DATA:  Myelopathy, acute, cervical spine EXAM: MRI CERVICAL SPINE WITHOUT CONTRAST TECHNIQUE: Multiplanar, multisequence MR imaging of the cervical spine was performed. No intravenous contrast was administered. COMPARISON:  None Available. FINDINGS: Alignment: No substantial sagittal subluxation. Vertebrae: No fracture, evidence of discitis, or bone lesion. Cord: Normal cord signal. Posterior Fossa, vertebral arteries, paraspinal tissues: Abnormal right vertebral artery flow void. Disc levels: C2-C3: No significant disc protrusion, foraminal stenosis, or canal stenosis. C3-C4: Bilateral facet and uncovertebral hypertrophy with mild-to-moderate bilateral foraminal stenosis. Patent canal. C4-C5: Bilateral facet uncovertebral hypertrophy with mild-to-moderate left greater than right foraminal stenosis. Patent canal. C5-C6: Right uncovertebral hypertrophy with mild right foraminal stenosis. Patent canal and left foramen. C6-C7: Right greater left facet uncovertebral hypertrophy. Resulting moderate right foraminal stenosis. Patent canal and left foramen. C7-T1: No significant disc protrusion, foraminal stenosis, or canal stenosis. IMPRESSION: 1. Moderate right foraminal stenosis C6-C7. 2.  Mild-to-moderate bilateral foraminal stenosis C3-C4 and C4-C5. 3. Abnormal right vertebral artery flow void. Recommend CTA head/neck to further evaluate for suspected occlusion or significant stenosis. Electronically Signed   By: Stevenson Elbe M.D.   On: 06/05/2023 23:20   MR BRAIN WO CONTRAST Result Date: 06/05/2023 CLINICAL DATA:  Neuro deficit, acute, stroke suspected  EXAM: MRI HEAD WITHOUT CONTRAST TECHNIQUE: Multiplanar, multiecho pulse sequences of the brain and surrounding structures were obtained without intravenous contrast. COMPARISON:  CT head from today. 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 Associated edema without substantial mass effect or midline shift. Possible mild petechial hemorrhage. No mass occupying acute hemorrhage. No mass lesion or hydrocephalus. Vascular: Poor right intradural vertebral artery flow void. Skull and upper cervical spine: Normal marrow signal. Sinuses/Orbits: Negative. Other: No mastoid effusions. IMPRESSION: 1. 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 2. Poor right intradural vertebral artery flow void. This could be secondary to small/nondominant vessel; however, recommend CTA head/neck to further characterize and exclude occlusion or stenosis. Electronically Signed   By: Stevenson Elbe M.D.   On: 06/05/2023 23:01   CT HEAD WO CONTRAST Result Date: 06/05/2023 CLINICAL DATA:  Neuro deficit, acute, stroke suspected Right-sided weakness. EXAM: CT HEAD WITHOUT CONTRAST TECHNIQUE: Contiguous axial images were obtained from the base of the skull through the vertex without intravenous contrast. RADIATION DOSE REDUCTION: This exam was performed according to the departmental dose-optimization program which includes automated exposure control, adjustment of the mA and/or kV according to patient size and/or use of iterative reconstruction technique. COMPARISON:  None Available. FINDINGS: Brain: No intracranial hemorrhage, mass effect, or midline shift. No hydrocephalus. The basilar cisterns are patent. No evidence of territorial infarct or acute ischemia. No extra-axial or intracranial fluid collection. Vascular: Atherosclerosis of skullbase vasculature without hyperdense vessel or abnormal calcification. Skull: No fracture or focal lesion.  Sinuses/Orbits: Mild mucosal thickening throughout the paranasal sinuses. No fluid levels. No mastoid effusion. Other: None. IMPRESSION: No acute intracranial abnormality. Electronically Signed   By: Chadwick Colonel M.D.   On: 06/05/2023 18:11     PMFS History: Patient Active Problem List   Diagnosis Date Noted   Acute CVA (cerebrovascular accident) (HCC) 06/05/2023   Past Medical History:  Diagnosis Date   Diabetes mellitus without complication (HCC)    Gout    Hypertension    Pulmonary embolism (HCC)     Family History  Problem Relation Age of Onset   Healthy Mother    Diabetes Other     Past Surgical History:  Procedure Laterality Date   ROTATOR CUFF REPAIR Left    Social History   Occupational History   Not on file  Tobacco Use   Smoking status: Never   Smokeless tobacco: Never  Vaping Use   Vaping status: Never Used  Substance and Sexual Activity   Alcohol use: Not Currently   Drug use: Yes    Types: Marijuana   Sexual activity: Not on file

## 2023-06-05 NOTE — ED Notes (Signed)
 Please update [phone] Edward Cabrera (mother)

## 2023-06-05 NOTE — ED Provider Notes (Signed)
 [place] EMERGENCY DEPARTMENT AT [facility] Provider Note   CSN: [id number] Arrival date & time: 06/05/23  4098     History {Add pertinent medical, surgical, social history, OB history to HPI:1} Chief Complaint  Patient presents with   Paralysis    Edward Cabrera is a 63 y.o. male.  64 year old male with past medical history of hypertension and hyperlipidemia presenting to the emergency department today with right arm weakness.  This is been going on now for the past week or so.  The patient states initially he was having just difficulty with writing at work.  He states that this is progressed to the point now where he is having difficulty lifting his arm.  He does report some neck pain but also reports a mild headache with this.  He went to orthopedics today and was sent to the ER for further evaluation.  The patient denies any weakness, numbness, or tingling in his other extremities.        Home Medications Prior to Admission medications   Medication Sig Start Date End Date Taking? Authorizing Provider  acetaminophen  (TYLENOL ) 325 MG tablet Take 650 mg by mouth every 6 (six) hours as needed for mild pain.    [provider]  amLODipine  (NORVASC ) 10 MG tablet Take 1 tablet (10 mg total) by mouth daily. 05/21/23   Marius Siemens, NP  atorvastatin  (LIPITOR) 80 MG tablet Take 1 tablet (80 mg total) by mouth daily. 03/25/22   Marius Siemens, NP  Blood Gluc Meter Disp-Strips (BLOOD GLUCOSE METER DISPOSABLE) DEVI 1 kit by Does not apply route 2 (two) times daily. 05/12/19   Marius Siemens, NP  blood glucose meter kit and supplies Dispense based on patient and insurance preference. Use up to four times daily as directed. (FOR ICD-10 E10.9, E11.9). 05/12/19   Marius Siemens, NP  lisinopril -hydrochlorothiazide  (ZESTORETIC ) 10-12.5 MG tablet Take 1 tablet by mouth daily. 05/21/23   Marius Siemens, NP      Allergies    Iohexol and Prednisone  &  diphenhydramine    Review of Systems   Review of Systems  Musculoskeletal:  Positive for neck pain.  Neurological:  Positive for weakness, numbness and headaches.  All other systems reviewed and are negative.   Physical Exam Updated Vital Signs BP (!) 160/93 (BP Location: Left Arm)   Pulse 77   Temp 97.8 F (36.6 C) (Oral)   Resp 20   Wt 104.3 kg   SpO2 99%   BMI 34.97 kg/m  Physical Exam Vitals and nursing note reviewed.   Gen: NAD Eyes: PERRL, EOMI HEENT: no oropharyngeal swelling Neck: trachea midline Resp: clear to auscultation bilaterally Card: RRR, no murmurs, rubs, or gallops Abd: nontender, nondistended Extremities: no calf tenderness, no edema Vascular: 2+ radial pulses bilaterally, 2+ DP pulses bilaterally Neuro: The patient has equal strength and sensation throughout the bilateral lower extremities.  Cranial nerves intact.  Left upper extremity with normal strength.  Right upper extremity with what appears to be some mild atrophy and the inability to squeeze AB duct, adductor, or oppose.  He has minimal to no strength with wrist flexion extension he has diminished strength with elbow flexion but does seem to have 3 out of 5 strength with elbow extension.  He is unable to lift his arm against gravity. Skin: no rashes Psyc: acting appropriately   ED Results / Procedures / Treatments   Labs (all labs ordered are listed, but only abnormal results are  displayed) Labs Reviewed  APTT - Abnormal; Notable for the following components:      Result Value   aPTT 22 (*)    All other components within normal limits  COMPREHENSIVE METABOLIC PANEL WITH GFR - Abnormal; Notable for the following components:   Glucose, Bld 219 (*)    Creatinine, Ser 1.60 (*)    GFR, Estimated 49 (*)    All other components within normal limits  CBG MONITORING, ED - Abnormal; Notable for the following components:   Glucose-Capillary 221 (*)    All other components within normal limits   I-STAT CHEM 8, ED - Abnormal; Notable for the following components:   Creatinine, Ser 1.70 (*)    Glucose, Bld 223 (*)    All other components within normal limits  CBG MONITORING, ED - Abnormal; Notable for the following components:   Glucose-Capillary 250 (*)    All other components within normal limits  PROTIME-INR  CBC  DIFFERENTIAL  ETHANOL    EKG None  Radiology CT HEAD WO CONTRAST Result Date: 06/05/2023 CLINICAL DATA:  Neuro deficit, acute, stroke suspected Right-sided weakness. EXAM: CT HEAD WITHOUT CONTRAST TECHNIQUE: Contiguous axial images were obtained from the base of the skull through the vertex without intravenous contrast. RADIATION DOSE REDUCTION: This exam was performed according to the departmental dose-optimization program which includes automated exposure control, adjustment of the mA and/or kV according to patient size and/or use of iterative reconstruction technique. COMPARISON:  None Available. FINDINGS: Brain: No intracranial hemorrhage, mass effect, or midline shift. No hydrocephalus. The basilar cisterns are patent. No evidence of territorial infarct or acute ischemia. No extra-axial or intracranial fluid collection. Vascular: Atherosclerosis of skullbase vasculature without hyperdense vessel or abnormal calcification. Skull: No fracture or focal lesion. Sinuses/Orbits: Mild mucosal thickening throughout the paranasal sinuses. No fluid levels. No mastoid effusion. Other: None. IMPRESSION: No acute intracranial abnormality. Electronically Signed   By: Chadwick Colonel M.D.   On: 06/05/2023 18:11    Procedures Procedures  {Document cardiac monitor, telemetry assessment procedure when appropriate:1}  Medications Ordered in ED Medications  sodium chloride flush (NS) 0.9 % injection 3 mL (3 mLs Intravenous Not Given 06/05/23 1844)    ED Course/ Medical Decision Making/ A&P   {   Click here for ABCD2, HEART and other calculatorsREFRESH Note before signing :1}                               Medical Decision Making 63 year old male with past medical history of diabetes and hypertension presenting to the emergency department today with extremity weakness in the right upper extremity.  This has been going now for the past week or so.  Will further evaluate him here with an MRI of his head and cervical spine.  The patient's initial CT scan at triage is negative.  Labs are largely unremarkable.  Amount and/or Complexity of Data Reviewed Radiology: ordered.   ***  {Document critical care time when appropriate:1} {Document review of labs and clinical decision tools ie heart score, Chads2Vasc2 etc:1}  {Document your independent review of radiology images, and any outside records:1} {Document your discussion with family members, caretakers, and with consultants:1} {Document social determinants of health affecting pt's care:1} {Document your decision making why or why not admission, treatments were needed:1} Final Clinical Impression(s) / ED Diagnoses Final diagnoses:  None    Rx / DC Orders ED Discharge Orders     None

## 2023-06-05 NOTE — ED Triage Notes (Signed)
 Patient c/o being unable to use his right arm x 1 week.  Patient reports it started as not being able to hold a pen, but it progressed to his entire right arm.  Patient has no other deficits noted. Patient gives verbal consent for MSE.

## 2023-06-05 NOTE — ED Provider Triage Note (Signed)
 Emergency Medicine Provider Triage Evaluation Note  Edward Cabrera , a 63 y.o. male  was evaluated in triage.  Pt complains of right sided paralysis.  Patient states that his right upper extremity has been weak for 1 week now, symptoms have progressively worsened but before 1 week ago he had full use of his right arm, now he has to pick his arm up with his opposite arm to move it.  He reports intermittent headache, and has experienced facial numbness on the right side at times however this is resolved at this point. Denies chest pain/shortness of breath.   Review of Systems  Positive: As above Negative: As above  Physical Exam  BP (!) 143/112 (BP Location: Right Arm)   Pulse (!) 113   Temp 98.1 F (36.7 C)   Resp 20   Wt 104.3 kg   SpO2 97%   BMI 34.97 kg/m  Gen:   Awake, no distress   Resp:  Normal effort  MSK:   Moves extremities without difficulty  Other:  Near complete paralysis of the RUE. Sensation is in-tact and equal bilaterally, grip strength of L hand is in-tact. 5/5 strength against resistance of LUE and bilateral LE's. No evidence of facial droop/asymmetry.   Medical Decision Making  Medically screening exam initiated at 4:48 PM.  Appropriate orders placed.  Edward Cabrera was informed that the remainder of the evaluation will be completed by another provider, this initial triage assessment does not replace that evaluation, and the importance of remaining in the ED until their evaluation is complete.     Kendrick Pax, [place] 06/05/23 1650

## 2023-06-06 ENCOUNTER — Observation Stay (HOSPITAL_COMMUNITY)

## 2023-06-06 DIAGNOSIS — I618 Other nontraumatic intracerebral hemorrhage: Secondary | ICD-10-CM | POA: Diagnosis present

## 2023-06-06 DIAGNOSIS — I6340 Cerebral infarction due to embolism of unspecified cerebral artery: Secondary | ICD-10-CM | POA: Diagnosis not present

## 2023-06-06 DIAGNOSIS — I6522 Occlusion and stenosis of left carotid artery: Secondary | ICD-10-CM | POA: Diagnosis present

## 2023-06-06 DIAGNOSIS — E669 Obesity, unspecified: Secondary | ICD-10-CM | POA: Diagnosis present

## 2023-06-06 DIAGNOSIS — R1310 Dysphagia, unspecified: Secondary | ICD-10-CM | POA: Diagnosis present

## 2023-06-06 DIAGNOSIS — E782 Mixed hyperlipidemia: Secondary | ICD-10-CM | POA: Diagnosis not present

## 2023-06-06 DIAGNOSIS — E1122 Type 2 diabetes mellitus with diabetic chronic kidney disease: Secondary | ICD-10-CM | POA: Diagnosis present

## 2023-06-06 DIAGNOSIS — G8191 Hemiplegia, unspecified affecting right dominant side: Secondary | ICD-10-CM | POA: Diagnosis present

## 2023-06-06 DIAGNOSIS — R29810 Facial weakness: Secondary | ICD-10-CM | POA: Diagnosis present

## 2023-06-06 DIAGNOSIS — I639 Cerebral infarction, unspecified: Secondary | ICD-10-CM

## 2023-06-06 DIAGNOSIS — I6389 Other cerebral infarction: Secondary | ICD-10-CM

## 2023-06-06 DIAGNOSIS — I63422 Cerebral infarction due to embolism of left anterior cerebral artery: Secondary | ICD-10-CM | POA: Diagnosis present

## 2023-06-06 DIAGNOSIS — N1832 Chronic kidney disease, stage 3b: Secondary | ICD-10-CM | POA: Diagnosis present

## 2023-06-06 DIAGNOSIS — Z830 Family history of human immunodeficiency virus [HIV] disease: Secondary | ICD-10-CM | POA: Diagnosis not present

## 2023-06-06 DIAGNOSIS — Z833 Family history of diabetes mellitus: Secondary | ICD-10-CM | POA: Diagnosis not present

## 2023-06-06 DIAGNOSIS — E66811 Obesity, class 1: Secondary | ICD-10-CM | POA: Diagnosis not present

## 2023-06-06 DIAGNOSIS — E1165 Type 2 diabetes mellitus with hyperglycemia: Secondary | ICD-10-CM

## 2023-06-06 DIAGNOSIS — G8929 Other chronic pain: Secondary | ICD-10-CM | POA: Diagnosis present

## 2023-06-06 DIAGNOSIS — Z6834 Body mass index (BMI) 34.0-34.9, adult: Secondary | ICD-10-CM | POA: Diagnosis not present

## 2023-06-06 DIAGNOSIS — I10 Essential (primary) hypertension: Secondary | ICD-10-CM

## 2023-06-06 DIAGNOSIS — Z91041 Radiographic dye allergy status: Secondary | ICD-10-CM | POA: Diagnosis not present

## 2023-06-06 DIAGNOSIS — R29703 NIHSS score 3: Secondary | ICD-10-CM | POA: Diagnosis present

## 2023-06-06 DIAGNOSIS — Z86711 Personal history of pulmonary embolism: Secondary | ICD-10-CM | POA: Diagnosis not present

## 2023-06-06 DIAGNOSIS — I69391 Dysphagia following cerebral infarction: Secondary | ICD-10-CM | POA: Diagnosis not present

## 2023-06-06 DIAGNOSIS — Z79899 Other long term (current) drug therapy: Secondary | ICD-10-CM | POA: Diagnosis not present

## 2023-06-06 DIAGNOSIS — G936 Cerebral edema: Secondary | ICD-10-CM | POA: Diagnosis present

## 2023-06-06 DIAGNOSIS — Z7982 Long term (current) use of aspirin: Secondary | ICD-10-CM | POA: Diagnosis not present

## 2023-06-06 DIAGNOSIS — I63512 Cerebral infarction due to unspecified occlusion or stenosis of left middle cerebral artery: Secondary | ICD-10-CM | POA: Diagnosis not present

## 2023-06-06 DIAGNOSIS — I1310 Hypertensive heart and chronic kidney disease without heart failure, with stage 1 through stage 4 chronic kidney disease, or unspecified chronic kidney disease: Secondary | ICD-10-CM | POA: Diagnosis present

## 2023-06-06 DIAGNOSIS — Z823 Family history of stroke: Secondary | ICD-10-CM | POA: Diagnosis not present

## 2023-06-06 DIAGNOSIS — Z7902 Long term (current) use of antithrombotics/antiplatelets: Secondary | ICD-10-CM | POA: Diagnosis not present

## 2023-06-06 DIAGNOSIS — E785 Hyperlipidemia, unspecified: Secondary | ICD-10-CM | POA: Diagnosis present

## 2023-06-06 LAB — BASIC METABOLIC PANEL WITH GFR
Anion gap: 8 (ref 5–15)
BUN: 12 mg/dL (ref 6–20)
CO2: 28 mmol/L (ref 22–32)
Calcium: 9.5 mg/dL (ref 8.9–10.3)
Chloride: 101 mmol/L (ref 98–111)
Creatinine, Ser: 1.43 mg/dL — ABNORMAL HIGH (ref 0.61–1.24)
GFR, Estimated: 56 mL/min — ABNORMAL LOW (ref >60)
Glucose, Bld: 207 mg/dL — ABNORMAL HIGH (ref 70–99)
Potassium: 3.4 mmol/L — ABNORMAL LOW (ref 3.5–5.1)
Sodium: 137 mmol/L (ref 135–145)

## 2023-06-06 LAB — HEMOGLOBIN A1C
Hgb A1c MFr Bld: 9.5 % — ABNORMAL HIGH (ref 4.8–5.6)
Mean Plasma Glucose: 225.95 mg/dL

## 2023-06-06 LAB — MAGNESIUM: Magnesium: 2 mg/dL (ref 1.7–2.4)

## 2023-06-06 LAB — GLUCOSE, CAPILLARY
Glucose-Capillary: 219 mg/dL — ABNORMAL HIGH (ref 70–99)
Glucose-Capillary: 109 mg/dL — ABNORMAL HIGH (ref 70–99)

## 2023-06-06 LAB — HIV ANTIBODY (ROUTINE TESTING W REFLEX): HIV Screen 4th Generation wRfx: NONREACTIVE

## 2023-06-06 LAB — ECHOCARDIOGRAM COMPLETE
Area-P 1/2: 3.33 cm2
Calc EF: 47.3 %
Height: 68 in
S' Lateral: 3.3 cm
Single Plane A2C EF: 28.8 %
Single Plane A4C EF: 58.7 %
Weight: 3680 [oz_av]

## 2023-06-06 LAB — CBC
HCT: 47.3 % (ref 39.0–52.0)
Hemoglobin: 15.6 g/dL (ref 13.0–17.0)
MCH: 27.5 pg (ref 26.0–34.0)
MCHC: 33 g/dL (ref 30.0–36.0)
MCV: 83.4 fL (ref 80.0–100.0)
Platelets: 189 10*3/uL (ref 150–400)
RBC: 5.67 MIL/uL (ref 4.22–5.81)
RDW: 12.6 % (ref 11.5–15.5)
WBC: 4.9 10*3/uL (ref 4.0–10.5)
nRBC: 0 % (ref 0.0–0.2)

## 2023-06-06 LAB — PHOSPHORUS: Phosphorus: 2.8 mg/dL (ref 2.5–4.6)

## 2023-06-06 MED ORDER — ENOXAPARIN SODIUM 60 MG/0.6ML IJ SOSY
50.0000 mg | PREFILLED_SYRINGE | INTRAMUSCULAR | Status: DC
  Administered 2023-06-06 – 2023-06-07 (×2): 50 mg via SUBCUTANEOUS
  Filled 2023-06-06 (×2): qty 0.6

## 2023-06-06 MED ORDER — PROCHLORPERAZINE EDISYLATE 10 MG/2ML IJ SOLN: 5.0000 mg | Freq: Four times a day (QID) | INTRAMUSCULAR | Status: DC | PRN

## 2023-06-06 MED ORDER — MELATONIN 5 MG PO TABS: 5.0000 mg | ORAL_TABLET | Freq: Every evening | ORAL | Status: DC | PRN

## 2023-06-06 MED ORDER — CLOPIDOGREL BISULFATE 75 MG PO TABS
225.0000 mg | ORAL_TABLET | Freq: Once | ORAL | Status: AC
  Administered 2023-06-06: 225 mg via ORAL
  Filled 2023-06-06: qty 3

## 2023-06-06 MED ORDER — CLOPIDOGREL BISULFATE 75 MG PO TABS
75.0000 mg | ORAL_TABLET | Freq: Every day | ORAL | Status: DC
  Administered 2023-06-06: 75 mg via ORAL
  Filled 2023-06-06: qty 1

## 2023-06-06 MED ORDER — ATORVASTATIN CALCIUM 80 MG PO TABS: 80.0000 mg | ORAL_TABLET | Freq: Every day | ORAL | Status: DC

## 2023-06-06 MED ORDER — ORAL CARE MOUTH RINSE
15.0000 mL | OROMUCOSAL | Status: DC | PRN
Start: 2023-06-06 — End: 2023-06-07

## 2023-06-06 MED ORDER — ATORVASTATIN CALCIUM 80 MG PO TABS
80.0000 mg | ORAL_TABLET | Freq: Every day | ORAL | Status: DC
  Administered 2023-06-06 – 2023-06-07 (×2): 80 mg via ORAL
  Filled 2023-06-06 (×2): qty 1

## 2023-06-06 MED ORDER — INSULIN ASPART 100 UNIT/ML IJ SOLN: 0.0000 [IU] | Freq: Every day | INTRAMUSCULAR | Status: DC

## 2023-06-06 MED ORDER — CLOPIDOGREL BISULFATE 75 MG PO TABS: 75.0000 mg | ORAL_TABLET | Freq: Every day | ORAL | Status: DC

## 2023-06-06 MED ORDER — ASPIRIN 81 MG PO TBEC: 81.0000 mg | DELAYED_RELEASE_TABLET | Freq: Every day | ORAL | Status: DC

## 2023-06-06 MED ORDER — ACETAMINOPHEN 325 MG PO TABS: 650.0000 mg | ORAL_TABLET | Freq: Four times a day (QID) | ORAL | Status: DC | PRN

## 2023-06-06 MED ORDER — POLYETHYLENE GLYCOL 3350 17 G PO PACK: 17.0000 g | PACK | Freq: Every day | ORAL | Status: DC | PRN

## 2023-06-06 MED ORDER — LACTATED RINGERS IV SOLN: INTRAVENOUS | Status: AC

## 2023-06-06 MED ORDER — CLOPIDOGREL BISULFATE 75 MG PO TABS
75.0000 mg | ORAL_TABLET | Freq: Every day | ORAL | Status: DC
  Administered 2023-06-07: 75 mg via ORAL
  Filled 2023-06-06: qty 1

## 2023-06-06 MED ORDER — ASPIRIN 81 MG PO CHEW
243.0000 mg | CHEWABLE_TABLET | Freq: Once | ORAL | Status: AC
  Administered 2023-06-06: 243 mg via ORAL
  Filled 2023-06-06: qty 3

## 2023-06-06 MED ORDER — GADOBUTROL 1 MMOL/ML IV SOLN
10.0000 mL | Freq: Once | INTRAVENOUS | Status: AC | PRN
  Administered 2023-06-06: 10 mL via INTRAVENOUS

## 2023-06-06 MED ORDER — INSULIN ASPART 100 UNIT/ML IJ SOLN
0.0000 [IU] | Freq: Three times a day (TID) | INTRAMUSCULAR | Status: DC
  Administered 2023-06-06: 5 [IU] via SUBCUTANEOUS
  Administered 2023-06-07: 3 [IU] via SUBCUTANEOUS
  Administered 2023-06-07: 2 [IU] via SUBCUTANEOUS

## 2023-06-06 MED ORDER — ASPIRIN 300 MG RE SUPP: 300.0000 mg | Freq: Once | RECTAL | Status: AC

## 2023-06-06 MED ORDER — ENOXAPARIN SODIUM 40 MG/0.4ML IJ SOSY: 40.0000 mg | PREFILLED_SYRINGE | INTRAMUSCULAR | Status: DC

## 2023-06-06 MED ORDER — STROKE: EARLY STAGES OF RECOVERY BOOK
Freq: Once | Status: AC
  Filled 2023-06-06: qty 1

## 2023-06-06 MED ORDER — ASPIRIN 81 MG PO TBEC
81.0000 mg | DELAYED_RELEASE_TABLET | Freq: Every day | ORAL | Status: DC
  Administered 2023-06-06 – 2023-06-07 (×2): 81 mg via ORAL
  Filled 2023-06-06 (×2): qty 1

## 2023-06-06 NOTE — Inpatient Diabetes Management (Signed)
 Inpatient Diabetes Program Recommendations  AACE/ADA: New Consensus Statement on Inpatient Glycemic Control (2015)  Target Ranges:  Prepandial:   less than 140 mg/dL      Peak postprandial:   less than 180 mg/dL (1-2 hours)      Critically ill patients:  140 - 180 mg/dL   Lab Results  Component Value Date   GLUCAP 250 (H) 06/05/2023   HGBA1C 9.5 (H) 06/06/2023    Latest Reference Range & Units 06/05/23 16:08 06/05/23 16:14  Glucose-Capillary 70 - 99 mg/dL 621 (H) 308 (H)  (H): Data is abnormally high  Diabetes history: No prior DM Current orders for Inpatient glycemic control: None  Inpatient Diabetes Program Recommendations:   Please consider: -Add Glycemic control order set with 0-6 units q 4 hrs. Correction. Will address new onset diabetes during hospitalization when symptoms improve.  Thank you, Cheyenne Bordeaux E. Jakel Alphin, RN, MSN, CDCES  Diabetes Coordinator Inpatient Glycemic Control Team Team Pager [phone] (8am-5pm) 06/06/2023 11:08 AM

## 2023-06-06 NOTE — H&P (Signed)
 History and Physical  Edward Cabrera ZOX:[id number] DOB: 1960/12/11 DOA: 06/05/2023  Referring physician: Dr. Charlee Conine, EDP  PCP: Marius Siemens, NP  Outpatient Specialists: None Patient coming from: Home through orthopedic surgery's office.  Chief Complaint: Right arm and right hand weakness.  HPI: Edward Cabrera is a 63 y.o. male with medical history significant for uncontrolled hypertension, uncontrolled type 2 diabetes, CKD 3B, prior history of pulmonary embolism not on oral anticoagulation, who presents with 1 week history of progressively worsening right arm and right hand weakness.  He initially thought it was related to his right shoulder nerve pain.  He was dropping things with his right hand but did not know why.  States he ran out of his blood pressure medications and was off of them for about a month.  Restarted taking his home medications about a week ago.  He initially went to his orthopedic surgery clinic today.  While he was there, he was advised to go to the ER for his right hand weakness and to rule out a stroke.  In the ER, blood pressure was elevated with DBP greater than 110.  Noncontrast head CT was nonacute however MRI brain revealed acute left posterior frontal and left occipital infarcts with suspected mild petechial hemorrhage.  The patient was seen by neurology/stroke team.  Complete stroke workup in progress.  Admitted by [facility], hospitalist service.  ED Course: Temperature 98.  BP 156/96, pulse 64, respiration rate 14.  O2 saturation 99% on room air.  Review of Systems: Review of systems as noted in the HPI. All other systems reviewed and are negative.   Past Medical History:  Diagnosis Date   Diabetes mellitus without complication (HCC)    Gout    Hypertension    Pulmonary embolism (HCC)    Past Surgical History:  Procedure Laterality Date   ROTATOR CUFF REPAIR Left     Social History:  reports that he has never smoked. He has never used smokeless tobacco.  He reports that he does not currently use alcohol. He reports current drug use. Drug: Marijuana.   Allergies  Allergen Reactions   Iohexol      Code: HIVES, Desc: PT. WAS GIVEN 50 MG BENADRLY IV 30 MINUTES PRE-PROCEDURE W/ NO REACTION-----ARS 10-21-05, Onset Date: [id number]     Family History  Problem Relation Age of Onset   Healthy Mother    Diabetes Other   Father with history of kidney failure. Brother with history of kidney failure. 2 Sisters deceased with HIV complications.  Prior to Admission medications   Medication Sig Start Date End Date Taking? Authorizing Provider  amLODipine  (NORVASC ) 10 MG tablet Take 1 tablet (10 mg total) by mouth daily. 05/21/23  Yes Marius Siemens, NP  lisinopril -hydrochlorothiazide  (ZESTORETIC ) 10-12.5 MG tablet Take 1 tablet by mouth daily. 05/21/23  Yes Marius Siemens, NP  Omega-3 Fatty Acids (ULTRA OMEGA-3 FISH OIL) 1400 MG CAPS Take 2,800 mg by mouth daily.   Yes [provider]    Physical Exam: BP (!) 144/95 (BP Location: Left Arm)   Pulse 74   Temp 98 F (36.7 C) (Oral)   Resp 19   Wt 104.3 kg   SpO2 100%   BMI 34.97 kg/m   General: 63 y.o. year-old male well developed well nourished in no acute distress.  Alert and oriented x3. Cardiovascular: Regular rate and rhythm with no rubs or gallops.  No thyromegaly or JVD noted.  No lower extremity edema. 2/4 pulses in all  4 extremities. Respiratory: Clear to auscultation with no wheezes or rales. Good inspiratory effort. Abdomen: Soft nontender nondistended with normal bowel sounds x4 quadrants. Muskuloskeletal: No cyanosis, clubbing or edema noted bilaterally Neuro: CN II-XII intact, sensation, reflexes.  Right hand is very weak and cannot squeeze. Skin: No ulcerative lesions noted or rashes Psychiatry: Judgement and insight appear normal. Mood is appropriate for condition and setting          Labs on Admission:  Basic Metabolic Panel: Recent Labs  Lab  06/05/23 1610 06/05/23 1619  NA 139 139  K 3.5 3.5  CL 104 102  CO2 25  --   GLUCOSE 219* 223*  BUN 13 13  CREATININE 1.60* 1.70*  CALCIUM  9.6  --    Liver Function Tests: Recent Labs  Lab 06/05/23 1610  AST 26  ALT 35  ALKPHOS 73  BILITOT 0.7  PROT 7.3  ALBUMIN 3.7   No results for input(s): "LIPASE", "AMYLASE" in the last 168 hours. No results for input(s): "AMMONIA" in the last 168 hours. CBC: Recent Labs  Lab 06/05/23 1610 06/05/23 1619  WBC 5.7  --   NEUTROABS 2.3  --   HGB 15.1 16.0  HCT 45.7 47.0  MCV 82.0  --   PLT 235  --    Cardiac Enzymes: No results for input(s): "CKTOTAL", "CKMB", "CKMBINDEX", "TROPONINI" in the last 168 hours.  BNP (last 3 results) No results for input(s): "BNP" in the last 8760 hours.  ProBNP (last 3 results) No results for input(s): "PROBNP" in the last 8760 hours.  CBG: Recent Labs  Lab 06/05/23 1608 06/05/23 1614  GLUCAP 221* 250*    Radiological Exams on Admission: MR Cervical Spine Wo Contrast Result Date: 06/05/2023 CLINICAL DATA:  Myelopathy, acute, cervical spine EXAM: MRI CERVICAL SPINE WITHOUT CONTRAST TECHNIQUE: Multiplanar, multisequence MR imaging of the cervical spine was performed. No intravenous contrast was administered. COMPARISON:  None Available. FINDINGS: Alignment: No substantial sagittal subluxation. Vertebrae: No fracture, evidence of discitis, or bone lesion. Cord: Normal cord signal. Posterior Fossa, vertebral arteries, paraspinal tissues: Abnormal right vertebral artery flow void. Disc levels: C2-C3: No significant disc protrusion, foraminal stenosis, or canal stenosis. C3-C4: Bilateral facet and uncovertebral hypertrophy with mild-to-moderate bilateral foraminal stenosis. Patent canal. C4-C5: Bilateral facet uncovertebral hypertrophy with mild-to-moderate left greater than right foraminal stenosis. Patent canal. C5-C6: Right uncovertebral hypertrophy with mild right foraminal stenosis. Patent canal and  left foramen. C6-C7: Right greater left facet uncovertebral hypertrophy. Resulting moderate right foraminal stenosis. Patent canal and left foramen. C7-T1: No significant disc protrusion, foraminal stenosis, or canal stenosis. IMPRESSION: 1. Moderate right foraminal stenosis C6-C7. 2. Mild-to-moderate bilateral foraminal stenosis C3-C4 and C4-C5. 3. Abnormal right vertebral artery flow void. Recommend CTA head/neck to further evaluate for suspected occlusion or significant stenosis. Electronically Signed   By: Stevenson Elbe M.D.   On: 06/05/2023 23:20   MR BRAIN WO CONTRAST Result Date: 06/05/2023 CLINICAL DATA:  Neuro deficit, acute, stroke suspected EXAM: MRI HEAD WITHOUT CONTRAST TECHNIQUE: Multiplanar, multiecho pulse sequences of the brain and surrounding structures were obtained without intravenous contrast. COMPARISON:  CT head from today. 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 Associated edema without substantial mass effect or midline shift. Possible mild petechial hemorrhage. No mass occupying acute hemorrhage. No mass lesion or hydrocephalus. Vascular: Poor right intradural vertebral artery flow void. Skull and upper cervical spine: Normal marrow signal. Sinuses/Orbits: Negative. Other: No mastoid effusions. IMPRESSION: 1. Acute left posterior frontal and left occipital infarcts  with suspected mild petechial hemorrhage. 2. Poor right intradural vertebral artery flow void. This could be secondary to small/nondominant vessel; however, recommend CTA head/neck to further characterize and exclude occlusion or stenosis. Electronically Signed   By: Stevenson Elbe M.D.   On: 06/05/2023 23:01   CT HEAD WO CONTRAST Result Date: 06/05/2023 CLINICAL DATA:  Neuro deficit, acute, stroke suspected Right-sided weakness. EXAM: CT HEAD WITHOUT CONTRAST TECHNIQUE: Contiguous axial images were obtained from the base of the skull through the vertex without intravenous contrast.  RADIATION DOSE REDUCTION: This exam was performed according to the departmental dose-optimization program which includes automated exposure control, adjustment of the mA and/or kV according to patient size and/or use of iterative reconstruction technique. COMPARISON:  None Available. FINDINGS: Brain: No intracranial hemorrhage, mass effect, or midline shift. No hydrocephalus. The basilar cisterns are patent. No evidence of territorial infarct or acute ischemia. No extra-axial or intracranial fluid collection. Vascular: Atherosclerosis of skullbase vasculature without hyperdense vessel or abnormal calcification. Skull: No fracture or focal lesion. Sinuses/Orbits: Mild mucosal thickening throughout the paranasal sinuses. No fluid levels. No mastoid effusion. Other: None. IMPRESSION: No acute intracranial abnormality. Electronically Signed   By: Chadwick Colonel M.D.   On: 06/05/2023 18:11    EKG: I independently viewed the EKG done and my findings are as followed: Sinus tachycardia rate of 122.  Nonspecific ST-T changes.  QTc 438.  Assessment/Plan Present on Admission:  Acute CVA (cerebrovascular accident) (HCC)  Principal Problem:   Acute CVA (cerebrovascular accident) (HCC)  Acute CVA MRI brain revealed acute left posterior frontal and left occipital infarcts with suspected mild petechial hemorrhage. Frequent neurochecks Permissive hypertension Treat SBP greater than 220 or DBP greater than 120 IV labetalol as needed with parameters PT/OT/speech therapy evaluation Aspirin and Plavix x 21 days then aspirin 81 mg daily alone. High intensity Lipitor 80 mg daily Fall precautions Follow 2D echo, MRA head and neck. Rest of management per neurology  Essential hypertension, BP is not at goal, elevated Ongoing permissive hypertension IV labetalol as needed with parameters Closely monitor vital signs.  Type 2 diabetes with hyperglycemia Follow hemoglobin A1c, goal less than 7.0 Follow fasting  lipid panel, goal less than 70.  Obesity BMI 34 Recommend weight loss outpatient, follow-up with PCP  Chronic right shoulder pain Follow-up with orthopedic surgery As needed analgesics  CKD 3B Renal function at baseline Gentle IV fluid hydration Monitor urine output   Time: 75 minutes.   DVT prophylaxis: Subcu Lovenox daily.  Code Status: Full code.  Family Communication: None at bedside.  Disposition Plan: Admitted to telemetry medical unit.  Consults called: Neurology/stroke team.  Admission status: Observation status.   Status is: Observation    Bary Boss MD [facility] Pager [phone]  If 7PM-7AM, please contact night-coverage [email or web address] Password [email or web address]  06/06/2023, 4:59 AM

## 2023-06-06 NOTE — Plan of Care (Signed)
 Patient currently with significant weakness on right arm.  Minimal motor movement of right arm to hand.  Other NIH assessments in normal limits.

## 2023-06-06 NOTE — Progress Notes (Addendum)
 STROKE TEAM PROGRESS NOTE   INTERIM HISTORY/SUBJECTIVE Noticed his arm going numb about a week ago and had an ortho appt for his shoulder who recommended he go to the ED for stroke work up  PT may work with him this afternoon. States he did not believe he had diabetes, discussion regarding health maintenance and importance of managing cholesterol and diabetes. DM coordinator consulted as well.  MRI scan of the brain shows patchy left frontal and parietal MCA branch infarcts.  MRA of the brain shows severe left cavernous carotid stenosis.  MRA neck shows no significant extracranial stenosis. OBJECTIVE  CBC    Component Value Date/Time   WBC 4.9 06/06/2023 0624   RBC 5.67 06/06/2023 0624   HGB 15.6 06/06/2023 0624   HGB 14.9 05/21/2023 1452   HCT 47.3 06/06/2023 0624   HCT 46.4 05/21/2023 1452   PLT 189 06/06/2023 0624   PLT 225 05/21/2023 1452   MCV 83.4 06/06/2023 0624   MCV 86 05/21/2023 1452   MCH 27.5 06/06/2023 0624   MCHC 33.0 06/06/2023 0624   RDW 12.6 06/06/2023 0624   RDW 13.2 05/21/2023 1452   LYMPHSABS 2.9 06/05/2023 1610   LYMPHSABS 2.3 05/21/2023 1452   MONOABS 0.5 06/05/2023 1610   EOSABS 0.0 06/05/2023 1610   EOSABS 0.0 05/21/2023 1452   BASOSABS 0.0 06/05/2023 1610   BASOSABS 0.0 05/21/2023 1452    BMET    Component Value Date/Time   NA 137 06/06/2023 0624   NA 140 05/21/2023 1452   K 3.4 (L) 06/06/2023 0624   CL 101 06/06/2023 0624   CO2 28 06/06/2023 0624   GLUCOSE 207 (H) 06/06/2023 0624   BUN 12 06/06/2023 0624   BUN 13 05/21/2023 1452   CREATININE 1.43 (H) 06/06/2023 0624   CALCIUM  9.5 06/06/2023 0624   EGFR 49 (L) 05/21/2023 1452   GFRNONAA 56 (L) 06/06/2023 0624    IMAGING past 24 hours MR ANGIO NECK W WO CONTRAST Result Date: 06/06/2023 EXAM: MRA Neck without and with contrast 06/06/2023 05:00:00 AM TECHNIQUE: Multiplanar multisequence MRA of the neck was performed without and with the administration of 10mL intravenous gadobutrol (GADAVIST)  1 MMOL/ML injection. COMPARISON: None available CLINICAL HISTORY: Neuro deficit, acute, stroke suspected. FINDINGS: CAROTID ARTERIES: The right carotid artery including the bifurcation is normal. Decreased flow is present in the left internal carotid artery. A high-grade stenosis is confirmed within the cavernous left ICA. No other significant carotid stenosis are present in the neck. VERTEBRAL ARTERIES: The time of flight images demonstrate no significant antegrade flow within the right vertebral artery. Flow is antegrade in the left vertebral artery. The postcontrast images confirm occlusion of the right vertebral artery. The right V4 segment is reconstituted with a high-grade distal stenosis at the vertebral basilar junction. The left vertebral artery and the basilar artery are normal. IMPRESSION: 1. 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 . 2. 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 High-grade tandem distal left vertebral artery stenosis at the vertebral basilar junction. Electronically signed by: Audree Leas MD 06/06/2023 05:42 AM EDT RP Workstation: [id number]   MR ANGIO HEAD WO CONTRAST Result Date: 06/06/2023 EXAM: MR Angiography Head without intravenous Contrast. 06/06/2023 05:01:04 AM TECHNIQUE: Magnetic resonance angiography images of the head without intravenous contrast. Three-dimensional MIP reformations performed. COMPARISON: MR head without contrast 06/05/2023. CLINICAL HISTORY: Neuro deficit, acute, stroke suspected. Acute left posterior frontal and left occipital  infarcts. FINDINGS: ANTERIOR CIRCULATION: Focal signal loss within the cavernous left ICA suggests a high-grade stenosis. Decreased signal is present in the more distal left ICA and MCA branch vessels without other focal stenosis. Decreased signal is present in the left A1 segment. Normal signal is present in the  A2 segments bilaterally. POSTERIOR CIRCULATION: The distal vertebral arteries are not imaged. A dominant right iliac vessel is present. The basilar artery is normal. The branch vessels are within normal limits bilaterally. IMPRESSION: 1. Focal signal loss within the cavernous left ICA suggests a high-grade stenosis. 2. Decreased signal in the more distal left ICA, MCA branch vessels, and left A1 segment without other focal stenosis. Most likely reflects decreased flow in these vessels secondary to the more proximal stenosis. Electronically signed by: Audree Leas MD 06/06/2023 05:36 AM EDT RP Workstation: [id number]   MR Cervical Spine Wo Contrast Result Date: 06/05/2023 CLINICAL DATA:  Myelopathy, acute, cervical spine EXAM: MRI CERVICAL SPINE WITHOUT CONTRAST TECHNIQUE: Multiplanar, multisequence MR imaging of the cervical spine was performed. No intravenous contrast was administered. COMPARISON:  None Available. FINDINGS: Alignment: No substantial sagittal subluxation. Vertebrae: No fracture, evidence of discitis, or bone lesion. Cord: Normal cord signal. Posterior Fossa, vertebral arteries, paraspinal tissues: Abnormal right vertebral artery flow void. Disc levels: C2-C3: No significant disc protrusion, foraminal stenosis, or canal stenosis. C3-C4: Bilateral facet and uncovertebral hypertrophy with mild-to-moderate bilateral foraminal stenosis. Patent canal. C4-C5: Bilateral facet uncovertebral hypertrophy with mild-to-moderate left greater than right foraminal stenosis. Patent canal. C5-C6: Right uncovertebral hypertrophy with mild right foraminal stenosis. Patent canal and left foramen. C6-C7: Right greater left facet uncovertebral hypertrophy. Resulting moderate right foraminal stenosis. Patent canal and left foramen. C7-T1: No significant disc protrusion, foraminal stenosis, or canal stenosis. IMPRESSION: 1. Moderate right foraminal stenosis C6-C7. 2. Mild-to-moderate bilateral foraminal stenosis  C3-C4 and C4-C5. 3. Abnormal right vertebral artery flow void. Recommend CTA head/neck to further evaluate for suspected occlusion or significant stenosis. Electronically Signed   By: Stevenson Elbe M.D.   On: 06/05/2023 23:20   MR BRAIN WO CONTRAST Result Date: 06/05/2023 CLINICAL DATA:  Neuro deficit, acute, stroke suspected EXAM: MRI HEAD WITHOUT CONTRAST TECHNIQUE: Multiplanar, multiecho pulse sequences of the brain and surrounding structures were obtained without intravenous contrast. COMPARISON:  CT head from today. 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 Associated edema without substantial mass effect or midline shift. Possible mild petechial hemorrhage. No mass occupying acute hemorrhage. No mass lesion or hydrocephalus. Vascular: Poor right intradural vertebral artery flow void. Skull and upper cervical spine: Normal marrow signal. Sinuses/Orbits: Negative. Other: No mastoid effusions. IMPRESSION: 1. 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 2. Poor right intradural vertebral artery flow void. This could be secondary to small/nondominant vessel; however, recommend CTA head/neck to further characterize and exclude occlusion or stenosis. Electronically Signed   By: Stevenson Elbe M.D.   On: 06/05/2023 23:01   CT HEAD WO CONTRAST Result Date: 06/05/2023 CLINICAL DATA:  Neuro deficit, acute, stroke suspected Right-sided weakness. EXAM: CT HEAD WITHOUT CONTRAST TECHNIQUE: Contiguous axial images were obtained from the base of the skull through the vertex without intravenous contrast. RADIATION DOSE REDUCTION: This exam was performed according to the departmental dose-optimization program which includes automated exposure control, adjustment of the mA and/or kV according to patient size and/or use of iterative reconstruction technique. COMPARISON:  None Available. FINDINGS: Brain: No intracranial hemorrhage,  mass effect, or midline shift. No hydrocephalus. The basilar cisterns are patent.  No evidence of territorial infarct or acute ischemia. No extra-axial or intracranial fluid collection. Vascular: Atherosclerosis of skullbase vasculature without hyperdense vessel or abnormal calcification. Skull: No fracture or focal lesion. Sinuses/Orbits: Mild mucosal thickening throughout the paranasal sinuses. No fluid levels. No mastoid effusion. Other: None. IMPRESSION: No acute intracranial abnormality. Electronically Signed   By: Chadwick Colonel M.D.   On: 06/05/2023 18:11    Vitals:   06/06/23 0500 06/06/23 0606 06/06/23 0615 06/06/23 0915  BP:  (!) 170/113 (!) 157/99 (!) 150/138  Pulse:  84 95 84  Resp:  14 19 (!) 23  Temp:  97.8 F (36.6 C)    TempSrc:  Oral    SpO2:  100% 99% 95%  Weight:      Height: 5\' 8"  (1.727 m)        PHYSICAL EXAM General:  Alert,  awake   pleasant middle-aged African-American male n no acute distress Psych:  Mood and affect appropriate for situation CV: Regular rate and rhythm on monitor Respiratory:  Regular, unlabored respirations on room air GI: Abdomen soft and nontender   NEURO:  Mental Status: AA&Ox3, patient is able to give clear and coherent history Speech/Language: speech is without dysarthria or aphasia.  Naming, repetition, fluency, and comprehension intact.  Cranial Nerves:  II: PERRL. Visual fields full.  III, IV, VI: EOMI. Eyelids elevate symmetrically.  V: Sensation is intact to light touch and symmetrical to face.  VII: Right facial asymmetry  VIII: hearing intact to voice. IX, X: Palate elevates symmetrically. Phonation is normal.  [id number] shrug 5/5. XII: tongue is midline without fasciculations. Motor:  RUE 3/5 with drift.  Weakness of right grip and intrinsic hand muscles orbits left over right upper extremity Tone: is normal and bulk is normal Sensation- Intact to light touch bilaterally. Extinction absent to light touch to DSS.    Coordination: FTN intact bilaterally, HKS: no ataxia in BLE.No drift.  Gait- deferred  Most Recent NIH 3    ASSESSMENT/PLAN  [name] is a 63 y.o. male with history of  diabetes, hypertension, prior history of PE who presents with about 1 week history of progressively worsening right arm weakness.  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 Poor right intradural vertebral artery flow void.  MRA  High-grade stenosis within the cavernous left internal carotid artery limits flow within the cervical left ICA. Occlusion of the right vertebral artery with reconstitution of the right V4 segment. High-grade tandem distal left vertebral artery stenosis at the vertebral basilar junction. LDL 164 HgbA1c 9.0 Echo VTE prophylaxis - lovenox No antithrombotic prior to admission, now on aspirin 81 mg daily and clopidogrel 75 mg daily for  3 months and then ASA alone. Therapy recommendations:  Pending Disposition:  Pending   Severe L cavernous ICA  Medical management with DAPT 90 days HOB at 30 degrees or less until 1 pm and then can work with PT/OT Consider outpatient consult to neuro IR  Hypertension Home meds:  lisinopril -hydrochlorothiazide , norvasc   Stable Blood Pressure Goal: BP less than 220/110   Hyperlipidemia LDL 164, goal < 70 Add Atorvastatin    Continue statin at discharge  Diabetes type II Uncontrolled HgbA1c 9.0, goal < 7.0 CBGs, SSI Recommend close follow-up with PCP for better DM control  Dysphagia Patient has post-stroke dysphagia, SLP consulted  Diet   Diet NPO time specified   Advance diet as tolerated  Other Stroke Risk Factors Obesity, Body mass index is 34.97 kg/m., BMI >/= 30 associated with increased stroke risk, recommend weight loss, diet and exercise as  appropriate   Shoulder injury  Continue outpatient ortho follow up   Hospital day # 0  Patient seen and examined by NP/APP with MD. MD to update note as needed.   Imogene Mana, DNP, FNP-BC [facility] Pager: [phone]  I have personally obtained history,examined this patient, reviewed notes, independently viewed imaging studies, participated in medical decision making and plan of care.ROS completed by me personally and pertinent positives fully documented  I have made any additions or clarifications directly to the above note. Agree with note above.  Patient presented with 1 week history of right upper extremity weakness secondary to left MCA branch infarct symptomatic from subjective left cavernous carotid stenosis.  Recommend dual antiplatelet therapy with aspirin and Plavix for 3 months followed by aspirin alone and aggressive risk factor modification.  Patient counseled to quit smoking and to eat a healthy diet and exercise.  Will also benefit with outpatient evaluation for sleep apnea.  Continue ongoing stroke workup.  Mobilize out of bed.  Therapy consults.  Greater than 50% time during this 50-minute visit was spent on counseling and coordination of care and discussion with patient and care team and answering questions.  Ardella Beaver, MD Medical Director [facility] Pager: [phone] 06/06/2023 3:35 PM   To contact Stroke Continuity provider, please refer to [email or web address]. After hours, contact General Neurology

## 2023-06-06 NOTE — Progress Notes (Signed)
 PT Cancellation Note  Patient Details Name: Edward Cabrera MRN: [id number] DOB: 07-24-1960   Cancelled Treatment:    Reason Eval/Treat Not Completed: Medical issues which prohibited therapy (PT consult appreciated and chart reviewed. Pt with new neuro symptoms of R facial droop and slurred speech, with written orders for [facility] flat or below 30 degrees.) PT will hold evaluation until pt is medically stable and able to participate in comprehensive assessment.   Glenford Lanes, PT, DPT Acute Rehabilitation Services Office: [phone] Secure Chat Preferred  [name] 06/06/2023, 9:01 AM

## 2023-06-06 NOTE — Evaluation (Signed)
 OT Cancellation Note  Patient Details Name: Edward Cabrera MRN: [id number] DOB: 12-Oct-1960   Cancelled Treatment:    Reason Eval/Treat Not Completed: Medical issues which prohibited therapy Patient with new neuro symptoms of R facial droop and slurred speech, with written orders for [facility] flat or below 30 degrees, OT will hold until medically stable to participate in comprehensive OT evaluation.   Mollie Anger E. Anjolina Byrer, OTR/L Acute Rehabilitation Services [phone]   Vincent Greek 06/06/2023, 8:56 AM

## 2023-06-06 NOTE — Evaluation (Signed)
 Speech Language Pathology Evaluation Patient Details Name: Edward Cabrera MRN: [id number] DOB: 1960/01/30 Today's Date: 06/06/2023 Time: 4696-2952 SLP Time Calculation (min) (ACUTE ONLY): 20 min  Problem List:  Patient Active Problem List   Diagnosis Date Noted   Acute CVA (cerebrovascular accident) (HCC) 06/05/2023   Past Medical History:  Past Medical History:  Diagnosis Date   Diabetes mellitus without complication (HCC)    Gout    Hypertension    Pulmonary embolism (HCC)    Past Surgical History:  Past Surgical History:  Procedure Laterality Date   ROTATOR CUFF REPAIR Left    HPI:  Patient is a 63 y.o. male with PMH: uncontrolled HTN, uncontrolled DM-2, CKD 3B, prior h/o PE not on oral anticoagulation. He presented to the hospital with one week h/o progressively worsening right arm and right hand weakness. Initially, he thought it was related to his right shoulder nerve pain but while at orthopedic clinic they advised him to go to ER to r/o stroke. MRI brain revealed acute left posterior frontal and left occipital infarcts with suspected mild petichial hemorrhage.   Assessment / Plan / Recommendation Clinical Impression  Patient is not presenting with clinical s/s of cognitive, linguistic or speech impairments. He is oriented to time, place, situation and self. He is able to accurately recall and describe recent medical assessments and interventions. He is motivated to be able to return to a level of function where he can do things on his own but he understands that he might not get full function of his right arm back. 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 This involves having to pull bins apart as they are stacked inside of each other and the suction they create results in this being a very physically demanding part of his job. He is understandably frustrated but appears to be in a good overall mindset and does not  appear to be self-limiting. SLP intervention not warranted at this time.    SLP Assessment  SLP Recommendation/Assessment: Patient does not need any further Speech Lanaguage Pathology Services SLP Visit Diagnosis: Cognitive communication deficit (R41.841)    Recommendations for follow up therapy are one component of a multi-disciplinary discharge planning process, led by the attending physician.  Recommendations may be updated based on patient status, additional functional criteria and insurance authorization.    Follow Up Recommendations  No SLP follow up    Assistance Recommended at Discharge  None  Functional Status Assessment Patient has had a recent decline in their functional status and demonstrates the ability to make significant improvements in function in a reasonable and predictable amount of time.  Frequency and Duration           SLP Evaluation Cognition  Overall Cognitive Status: Within Functional Limits for tasks assessed Orientation Level: Oriented X4       Comprehension  Auditory Comprehension Overall Auditory Comprehension: Appears within functional limits for tasks assessed    Expression Expression Primary Mode of Expression: Verbal Verbal Expression Overall Verbal Expression: Appears within functional limits for tasks assessed Initiation: No impairment Repetition: No impairment Naming: No impairment Pragmatics: No impairment   Oral / Motor  Oral Motor/Sensory Function Overall Oral Motor/Sensory Function: Within functional limits Motor Speech Overall Motor Speech: Appears within functional limits for tasks assessed Respiration: Within functional limits Resonance: Within functional limits Articulation: Within functional limitis Intelligibility: Intelligible Motor Planning: Witnin functional limits Motor Speech Errors: Not applicable  Jacqualine Mater, MA, CCC-SLP Speech Therapy

## 2023-06-06 NOTE — Progress Notes (Signed)
 Courtesy note No billing-  Patient is seen and examined today morning. He is admitted with right arm weakness, found to have acute left posterior and left occipital infarcts with mild petechial hemorrhage. MRA head and neck high-grade stenosis within cavernous left ICA, distal branches noted.    Seen by Neurology, continue permissive hypertension, Aspirin, Plavix, high-dose statin. May need neuro IR intervention vs medical management.  PT OT evaluation.  A1c 9.5 noted.  Started on cardiac carb consistent diet, sliding scale insulin.  Gradually resume his home antihypertensive medications.  Further management pending stroke workup.

## 2023-06-06 NOTE — Progress Notes (Signed)
 Brief Neuro Note:  Was notified of new R facial droop and slurred speech. This was not noted on my exam earlier. Reviewed vessel imaging which demonstrates severe L cavernous ICA narrowing with decreased signal in more distal MCA branches.  Plan: - Head of bed flat or below 30 - additional aspirin 234 and plavix 225 to get him to a loading dose of 324mg  of Aspirin and 300mg  of Plavix. - fluids bolus and IV fluids at 125ml/hr.  His symptoms were already improving from these interventions. Might need to consider consulting Neuro IR to see if he would benefit from possible stenting of the cavernous L ICA vs continue medical management for now. Will have stroke team weigh in on this in the AM today.  Edward Cabrera [facility]

## 2023-06-06 NOTE — Consult Note (Signed)
 NEUROLOGY CONSULT NOTE   Date of service: June 06, 2023 Patient Name: Edward Cabrera MRN:  [id number] DOB:  1960-07-17 Chief Complaint: "Right arm weakness" Requesting Provider: Bary Boss, DO  History of Present Illness  Edward Cabrera is a 63 y.o. male with hx of diabetes, hypertension, prior history of PE who presents with about 1 week history of progressively worsening right arm weakness.  He reports that he notices symptoms about a week ago when he noticed that he was having some trouble writing with his right hand.  Over the last week, his symptoms have gradually progressed and and now he can barely lift his right arm.  He came to the ED for further evaluation and workup.  He denies any prior history of strokes.  Endorses family history of strokes.  He does not smoke.  He uses marijuana about once or twice a week.  In the ED, he had further imaging with CT head without contrast which was negative for acute ventricular abnormalities.  He had an MRI of the brain without contrast which demonstrated a left posterior frontal cortical infarct along with an additional left occipital infarct with mild petechial hemorrhage.  He had an MRI of the C-spine which is nonrevealing.  LKW: 05/27/2023 Modified rankin score: 0-Completely asymptomatic and back to baseline post- stroke IV Thrombolysis: Not offered, outside of window.   EVT: Offered, outside of window.    NIHSS components Score: Comment  1a Level of Conscious 0[x]  1[]  2[]  3[]      1b LOC Questions 0[x]  1[]  2[]       1c LOC Commands 0[x]  1[]  2[]       2 Best Gaze 0[x]  1[]  2[]       3 Visual 0[x]  1[]  2[]  3[]      4 Facial Palsy 0[x]  1[]  2[]  3[]      5a Motor Arm - left 0[x]  1[]  2[]  3[]  4[]  UN[]    5b Motor Arm - Right 0[]  1[]  2[]  3[x]  4[]  UN[]    6a Motor Leg - Left 0[x]  1[]  2[]  3[]  4[]  UN[]    6b Motor Leg - Right 0[x]  1[]  2[]  3[]  4[]  UN[]    7 Limb Ataxia 0[x]  1[]  2[]  UN[]      8 Sensory 0[x]  1[]  2[]  UN[]      9 Best Language 0[x]  1[]  2[]  3[]       10 Dysarthria 0[x]  1[]  2[]  UN[]      11 Extinct. and Inattention 0[x]  1[]  2[]       TOTAL: 3      ROS  Comprehensive ROS performed and pertinent positives documented in HPI   Past History   Past Medical History:  Diagnosis Date   Diabetes mellitus without complication (HCC)    Gout    Hypertension    Pulmonary embolism (HCC)     Past Surgical History:  Procedure Laterality Date   ROTATOR CUFF REPAIR Left     Family History: Family History  Problem Relation Age of Onset   Healthy Mother    Diabetes Other     Social History  reports that he has never smoked. He has never used smokeless tobacco. He reports that he does not currently use alcohol. He reports current drug use. Drug: Marijuana.  Allergies  Allergen Reactions   Iohexol      Code: HIVES, Desc: PT. WAS GIVEN 50 MG BENADRLY IV 30 MINUTES PRE-PROCEDURE W/ NO REACTION-----ARS 10-21-05, Onset Date: [id number]     Medications   Current Facility-Administered Medications:    sodium chloride flush (NS) 0.9 %  injection 3 mL, 3 mL, Intravenous, Once, Edward Los, MD  Current Outpatient Medications:    amLODipine  (NORVASC ) 10 MG tablet, Take 1 tablet (10 mg total) by mouth daily., Disp: 90 tablet, Rfl: 1   lisinopril -hydrochlorothiazide  (ZESTORETIC ) 10-12.5 MG tablet, Take 1 tablet by mouth daily., Disp: 90 tablet, Rfl: 1   Omega-3 Fatty Acids (ULTRA OMEGA-3 FISH OIL) 1400 MG CAPS, Take 2,800 mg by mouth daily., Disp: , Rfl:   Vitals   Vitals:   June 12, 2023 2000 2023-06-12 2015 06/06/23 0030 06/06/23 0100  BP: (!) 152/111 (!) 153/108 (!) 153/101 (!) 144/95  Pulse: 77 69 66 74  Resp: 17  20 19   Temp:    98 F (36.7 C)  TempSrc:    Oral  SpO2: 100% 100% 98% 100%  Weight:        Body mass index is 34.97 kg/m.  Physical Exam   General: Laying comfortably in bed; in no acute distress.  HENT: Normal oropharynx and mucosa. Normal external appearance of ears and nose.  Neck: Supple, no pain or tenderness  CV:  No JVD. No peripheral edema.  Pulmonary: Symmetric Chest rise. Normal respiratory effort.  Abdomen: Soft to touch, non-tender.  Ext: No cyanosis, edema, or deformity  Skin: No rash. Normal palpation of skin.   Musculoskeletal: Normal digits and nails by inspection. No clubbing.   Neurologic Examination  Mental status/Cognition: Alert, oriented to self, place, month and year, good attention.  Speech/language: Fluent, comprehension intact, object naming intact, repetition intact.  Cranial nerves:   CN II Pupils equal and reactive to light, no VF deficits    CN III,IV,VI EOM intact, no gaze preference or deviation, no nystagmus    CN V normal sensation in V1, V2, and V3 segments bilaterally    CN VII no asymmetry, no nasolabial fold flattening    CN VIII normal hearing to speech    CN IX & X normal palatal elevation, no uvular deviation    CN XI 5/5 head turn and 5/5 shoulder shrug bilaterally   CN XII midline tongue protrusion    Motor:  Muscle bulk: Normal, tone normal Mvmt Root Nerve  Muscle Right Left Comments  SA C5/6 Ax Deltoid 3 5   EF C5/6 Mc Biceps 2 5   EE C6/7/8 Rad Triceps 2 5   WF C6/7 Med FCR 1 5   WE C7/8 PIN ECU 1 5   F Ab C8/T1 U ADM/FDI 1 5   HF L1/2/3 Fem Illopsoas 5 5   KE L2/3/4 Fem Quad 5 5   DF L4/5 D Peron Tib Ant 5 5   PF S1/2 Tibial Grc/Sol 5 5    Sensation:  Light touch Intact throughout   Pin prick    Temperature    Vibration   Proprioception    Coordination/Complex Motor:  - Finger to Nose intact in left upper extremity. - Heel to shin intact bilaterally - Rapid alternating movement intact in left upper extremity. - Gait: Deferred for patient safety. Labs/Imaging/Neurodiagnostic studies   CBC:  Recent Labs  Lab 2023-06-12 1610 Jun 12, 2023 1619  WBC 5.7  --   NEUTROABS 2.3  --   HGB 15.1 16.0  HCT 45.7 47.0  MCV 82.0  --   PLT 235  --    Basic Metabolic Panel:  Lab Results  Component Value Date   NA 139 06-12-23   K 3.5 06/12/2023    CO2 25 12-Jun-2023   GLUCOSE 223 (H) 06/12/23   BUN 13 06-12-23  CREATININE 1.70 (H) 06/05/2023   CALCIUM  9.6 06/05/2023   GFRNONAA 49 (L) 06/05/2023   GFRAA 73 04/21/2019   Lipid Panel:  Lab Results  Component Value Date   LDLCALC 164 (H) 05/21/2023   HgbA1c:  Lab Results  Component Value Date   HGBA1C 9.0 (A) 05/21/2023   Urine Drug Screen: No results found for: "LABOPIA", "COCAINSCRNUR", "LABBENZ", "AMPHETMU", "THCU", "LABBARB"  Alcohol Level     Component Value Date/Time   ETH <15 06/05/2023 1610   INR  Lab Results  Component Value Date   INR 1.0 06/05/2023   APTT  Lab Results  Component Value Date   APTT 22 (L) 06/05/2023   AED levels: No results found for: "PHENYTOIN", "ZONISAMIDE", "LAMOTRIGINE", "LEVETIRACETA"  CT Head without contrast(Personally reviewed): CTH was negative for a large hypodensity concerning for a large territory infarct or hyperdensity concerning for an ICH  MR Angio head without contrast and MR angio neck with and without contrast (Personally reviewed): Pending  MRI Brain(Personally reviewed): 1. 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 2. Poor right intradural vertebral artery flow void. This could be secondary to small/nondominant vessel; however, recommend CTA head/neck to further characterize and exclude occlusion or stenosis.  ASSESSMENT   COTTON BECKLEY is a 63 y.o. male with prior history of hypertension, diabetes, prior history of PE who presents with about 1 week history of progressively worsening right arm weakness and was found to have left posterior frontal cortical infarct along with an additional left occipital infarct with mild petechial hemorrhage.  Strokes appear embolic in nature.  Etiology is unclear and is pending for workup.  RECOMMENDATIONS  - Frequent Neuro checks per stroke unit protocol - Recommend Vascular imaging with MRA Angio Head without  contrast and MR angio of the neck with and without contrast - Recommend obtaining TTE - Recommend obtaining Lipid panel with LDL - Please start statin if LDL > 70 - Recommend HbA1c to evaluate for diabetes and how well it is controlled. - Antithrombotic -aspirin 81 mg daily along with Plavix and milligrams daily for 21 days, followed by aspirin 81 mg daily alone. - Recommend DVT ppx - SBP goal -aim for gradual normotension. - Recommend Telemetry monitoring for arrythmia - Recommend bedside swallow screen prior to PO intake. - Stroke education booklet - Recommend PT/OT/SLP consult - Recommend Urine Tox screen. ______________________________________________________________________    Signed, Edward Starzyk, MD [facility]

## 2023-06-06 NOTE — ED Notes (Signed)
 Off floor to MRI

## 2023-06-07 ENCOUNTER — Other Ambulatory Visit (HOSPITAL_COMMUNITY): Payer: Self-pay

## 2023-06-07 ENCOUNTER — Other Ambulatory Visit: Payer: Self-pay | Admitting: Cardiology

## 2023-06-07 DIAGNOSIS — E66811 Obesity, class 1: Secondary | ICD-10-CM

## 2023-06-07 DIAGNOSIS — I69391 Dysphagia following cerebral infarction: Secondary | ICD-10-CM

## 2023-06-07 DIAGNOSIS — R29703 NIHSS score 3: Secondary | ICD-10-CM | POA: Diagnosis not present

## 2023-06-07 DIAGNOSIS — E782 Mixed hyperlipidemia: Secondary | ICD-10-CM

## 2023-06-07 DIAGNOSIS — E1165 Type 2 diabetes mellitus with hyperglycemia: Secondary | ICD-10-CM | POA: Insufficient documentation

## 2023-06-07 DIAGNOSIS — Z86718 Personal history of other venous thrombosis and embolism: Secondary | ICD-10-CM

## 2023-06-07 DIAGNOSIS — E785 Hyperlipidemia, unspecified: Secondary | ICD-10-CM

## 2023-06-07 DIAGNOSIS — I63512 Cerebral infarction due to unspecified occlusion or stenosis of left middle cerebral artery: Secondary | ICD-10-CM | POA: Diagnosis not present

## 2023-06-07 DIAGNOSIS — E119 Type 2 diabetes mellitus without complications: Secondary | ICD-10-CM

## 2023-06-07 DIAGNOSIS — I10 Essential (primary) hypertension: Secondary | ICD-10-CM | POA: Insufficient documentation

## 2023-06-07 DIAGNOSIS — I639 Cerebral infarction, unspecified: Secondary | ICD-10-CM | POA: Diagnosis not present

## 2023-06-07 DIAGNOSIS — I6522 Occlusion and stenosis of left carotid artery: Secondary | ICD-10-CM | POA: Diagnosis not present

## 2023-06-07 LAB — GLUCOSE, CAPILLARY
Glucose-Capillary: 167 mg/dL — ABNORMAL HIGH (ref 70–99)
Glucose-Capillary: 145 mg/dL — ABNORMAL HIGH (ref 70–99)

## 2023-06-07 LAB — LIPID PANEL
Cholesterol: 224 mg/dL — ABNORMAL HIGH (ref 0–200)
HDL: 32 mg/dL — ABNORMAL LOW (ref >40)
LDL Cholesterol: 168 mg/dL — ABNORMAL HIGH (ref 0–99)
Total CHOL/HDL Ratio: 7 ratio
Triglycerides: 120 mg/dL (ref <150)
VLDL: 24 mg/dL (ref 0–40)

## 2023-06-07 MED ORDER — GLIMEPIRIDE 2 MG PO TABS
2.0000 mg | ORAL_TABLET | ORAL | 1 refills | Status: AC
  Filled 2023-06-07: qty 30, 30d supply, fill #0

## 2023-06-07 MED ORDER — CLOPIDOGREL BISULFATE 75 MG PO TABS
75.0000 mg | ORAL_TABLET | Freq: Every day | ORAL | 0 refills | Status: AC
  Filled 2023-06-07: qty 30, 30d supply, fill #0

## 2023-06-07 MED ORDER — LISINOPRIL 10 MG PO TABS: 10.0000 mg | ORAL_TABLET | Freq: Every day | ORAL | Status: DC

## 2023-06-07 MED ORDER — AMLODIPINE BESYLATE 5 MG PO TABS
10.0000 mg | ORAL_TABLET | Freq: Every day | ORAL | Status: DC
  Filled 2023-06-07: qty 2

## 2023-06-07 MED ORDER — HYDROCHLOROTHIAZIDE 12.5 MG PO TABS: 12.5000 mg | ORAL_TABLET | Freq: Every day | ORAL | Status: DC

## 2023-06-07 MED ORDER — LISINOPRIL-HYDROCHLOROTHIAZIDE 10-12.5 MG PO TABS: 1.0000 | ORAL_TABLET | Freq: Every day | ORAL | Status: DC

## 2023-06-07 MED ORDER — ULTRA OMEGA-3 FISH OIL 1400 MG PO CAPS: 2800.0000 mg | ORAL_CAPSULE | Freq: Every day | ORAL | Status: DC

## 2023-06-07 MED ORDER — ATORVASTATIN CALCIUM 80 MG PO TABS
80.0000 mg | ORAL_TABLET | Freq: Every day | ORAL | 1 refills | Status: AC
  Filled 2023-06-07: qty 30, 30d supply, fill #0

## 2023-06-07 MED ORDER — LIVING WELL WITH DIABETES BOOK
Freq: Once | Status: DC
  Filled 2023-06-07: qty 1

## 2023-06-07 MED ORDER — ASPIRIN 81 MG PO TBEC
81.0000 mg | DELAYED_RELEASE_TABLET | Freq: Every day | ORAL | 12 refills | Status: AC
  Filled 2023-06-07: qty 30, 30d supply, fill #0

## 2023-06-07 NOTE — Inpatient Diabetes Management (Addendum)
 Inpatient Diabetes Program Recommendations  AACE/ADA: New Consensus Statement on Inpatient Glycemic Control (2015)  Target Ranges:  Prepandial:   less than 140 mg/dL      Peak postprandial:   less than 180 mg/dL (1-2 hours)      Critically ill patients:  140 - 180 mg/dL   Lab Results  Component Value Date   GLUCAP 167 (H) 06/07/2023   HGBA1C 9.5 (H) 06/06/2023    Review of Glycemic Control  Diabetes history: new onset Outpatient Diabetes medications: none Current orders for Inpatient glycemic control: Novolog 0-15 units correction scale TID, Novolog 0-5 units HS scale  Inpatient Diabetes Program Recommendations:   Spoke to patient at the bedside. Discussed new onset diabetes; states that he had been told in the past that he had prediabetes. States that he does have a blood glucose meter at home, but has not used it in some time. Discussed the HgbA1C of 9.5% and explained to him what it means. Discussed normal blood sugar levels. Encouraged him to follow up with his PCP; states that he has an appointment with her on next Wednesday.   Discussed at length the plate method, the foods that he eats on a regular basis including fried foods, rice, potatoes, no vegetables, does like salads. Ordered Living Well with Diabetes booklet to take home. States that his mother does all the cooking at home.   Discussed with patient that he would probably need some oral medications for his diabetes such as Metformin. He states that he will not take Metformin. Patient wants to "research diabetes and how to control naturally."  For discharge: Recommend starting with Amaryl 2 mg daily and titrate as needed per PCP.   Nick Barman RN BSN CDE Diabetes Coordinator Pager: [phone]  8am-5pm

## 2023-06-07 NOTE — Progress Notes (Signed)
  Heart monitor requested by neurology for patient s/p CVA. Results will go to [facility] DOD Dr. Lavonne Prairie. NP appointment scheduled.  Leala Prince, PA-C

## 2023-06-07 NOTE — Evaluation (Signed)
 Physical Therapy Evaluation Patient Details Name: Edward Cabrera MRN: [id number] DOB: 1960/03/08 Today's Date: 06/07/2023  History of Present Illness  Pt is a 63 yo male admitted to [facility] on 06/05/23 with 1 week hx of worsening RUE weakness, while in ED pt developed R facial droop and slurry speech. MRI  demonstrated L frontal and L occipital infarcts. PMH of PE, CKD,DM II, HTN.  Clinical Impression  Pt presents with admitting diagnosis above. Pt today was able to ambulate in hallway and navigate stairs independently. Pt also able to perform DGI scoring 22/24 indicating that he is not a high fall risks. Pt main deficit is RUE flaccidity. Recommend OP Neuro PT upon DC. PT will continue to follow.         If plan is discharge home, recommend the following: Assist for transportation;Assistance with cooking/housework;Assistance with feeding   Can travel by private vehicle        Equipment Recommendations None recommended by PT  Recommendations for Other Services       Functional Status Assessment Patient has had a recent decline in their functional status and demonstrates the ability to make significant improvements in function in a reasonable and predictable amount of time.     Precautions / Restrictions Precautions Precautions: Fall Recall of Precautions/Restrictions: Intact Restrictions Weight Bearing Restrictions Per Provider Order: No      Mobility  Bed Mobility Overal bed mobility: Independent                  Transfers Overall transfer level: Independent Equipment used: None                    Ambulation/Gait Ambulation/Gait assistance: Independent Gait Distance (Feet): 300 Feet Assistive device: None Gait Pattern/deviations: WFL(Within Functional Limits) Gait velocity: Decreased     General Gait Details: no LOB noted. Pt able to perform DGI.  Stairs Stairs: Yes Stairs assistance: Independent Stair Management: One rail Left, Alternating pattern,  Forwards Number of Stairs: 2 General stair comments: no LOB noted.  Wheelchair Mobility     Tilt Bed    Modified Rankin (Stroke Patients Only) Modified Rankin (Stroke Patients Only) Pre-Morbid Rankin Score: No symptoms Modified Rankin: Slight disability     Balance Overall balance assessment: No apparent balance deficits (not formally assessed)                               Standardized Balance Assessment Standardized Balance Assessment : Dynamic Gait Index   Dynamic Gait Index Level Surface: Normal Change in Gait Speed: Normal Gait with Horizontal Head Turns: Mild Impairment Gait with Vertical Head Turns: Normal Gait and Pivot Turn: Mild Impairment Step Over Obstacle: Normal Step Around Obstacles: Normal Steps: Normal Total Score: 22       Pertinent Vitals/Pain Pain Assessment Pain Assessment: No/denies pain    Home Living Family/patient expects to be discharged to:: Private residence Living Arrangements: Parent Available Help at Discharge: Family;Available 24 hours/day Type of Home: House Home Access: Stairs to enter Entrance Stairs-Rails: Lawyer of Steps: 3   Home Layout: One level Home Equipment: Agricultural consultant (2 wheels);Cane - single point;Hand held shower head      Prior Function Prior Level of Function : Independent/Modified Independent;Driving;Working/employed;History of Falls (last six months) (Pt reports a couple of falls at work however appear to be mechanical in nature.)             Mobility Comments: Ind  ADLs Comments: Ind     Extremity/Trunk Assessment   Upper Extremity Assessment Upper Extremity Assessment: RUE deficits/detail RUE Deficits / Details: R arm flaccid    Lower Extremity Assessment Lower Extremity Assessment: Overall WFL for tasks assessed    Cervical / Trunk Assessment Cervical / Trunk Assessment: Normal  Communication   Communication Communication: No apparent  difficulties    Cognition Arousal: Alert Behavior During Therapy: WFL for tasks assessed/performed   PT - Cognitive impairments: No apparent impairments                         Following commands: Intact       Cueing Cueing Techniques: Verbal cues, Tactile cues     General Comments General comments (skin integrity, edema, etc.): VSS    Exercises     Assessment/Plan    PT Assessment Patient needs continued PT services  PT Problem List Decreased strength;Decreased range of motion;Decreased activity tolerance;Decreased balance;Decreased coordination;Decreased mobility;Decreased knowledge of use of DME;Decreased safety awareness;Decreased knowledge of precautions;Cardiopulmonary status limiting activity       PT Treatment Interventions DME instruction;Gait training;Stair training;Functional mobility training;Therapeutic activities;Therapeutic exercise;Balance training;Neuromuscular re-education;Patient/family education    PT Goals (Current goals can be found in the Care Plan section)  Acute Rehab PT Goals Patient Stated Goal: to go home PT Goal Formulation: With patient Time For Goal Achievement: 06/21/23 Potential to Achieve Goals: Good    Frequency Min 3X/week     Co-evaluation               AM-PAC PT "6 Clicks" Mobility  Outcome Measure Help needed turning from your back to your side while in a flat bed without using bedrails?: None Help needed moving from lying on your back to sitting on the side of a flat bed without using bedrails?: None Help needed moving to and from a bed to a chair (including a wheelchair)?: None Help needed standing up from a chair using your arms (e.g., wheelchair or bedside chair)?: None Help needed to walk in hospital room?: None Help needed climbing 3-5 steps with a railing? : None 6 Click Score: 24    End of Session Equipment Utilized During Treatment: Gait belt Activity Tolerance: Patient tolerated treatment  well Patient left: in bed;with call bell/phone within reach Nurse Communication: Mobility status PT Visit Diagnosis: Other abnormalities of gait and mobility (R26.89)    Time: 4098-1191 PT Time Calculation (min) (ACUTE ONLY): 24 min   Charges:   PT Evaluation $PT Eval Low Complexity: 1 Low PT Treatments $Gait Training: 8-22 mins PT General Charges $$ ACUTE PT VISIT: 1 Visit         Rodgers Clack, PT, DPT Acute Rehab Services [phone]   Fatmata Legere 06/07/2023, 9:31 AM

## 2023-06-07 NOTE — TOC Transition Note (Signed)
 Transition of Care [facility]) - Discharge Note   Patient Details  Name: Edward Cabrera MRN: [id number] Date of Birth: Sep 07, 1960  Transition of Care [facility]) CM/SW Contact:  Jonathan Neighbor, RN Phone Number: 06/07/2023, 1:30 PM   Clinical Narrative:     Pt is from home with his mother. She is able to provide minimal assist at home. No DME and no new needs.  Pt manages his own medications and denies any issues.  Pt drives self and his mother drives if needed.  Outpatient therapy arranged with [facility]. Information on the AVS. Pt will call to schedule the first appointment. Pt states his mother will provide transport home.  Final next level of care: OP Rehab Barriers to Discharge: No Barriers Identified   Patient Goals and CMS Choice     Choice offered to / list presented to : Patient      Discharge Placement                       Discharge Plan and Services Additional resources added to the After Visit Summary for                                       Social Drivers of Health (SDOH) Interventions SDOH Screenings   Food Insecurity: No Food Insecurity (06/06/2023)  Housing: Low Risk  (06/06/2023)  Transportation Needs: No Transportation Needs (06/06/2023)  Utilities: Not At Risk (06/06/2023)  Depression (PHQ2-9): Low Risk  (05/21/2023)  Social Connections: Unknown (06/06/2023)  Tobacco Use: Low Risk  (06/05/2023)     Readmission Risk Interventions     No data to display

## 2023-06-07 NOTE — Plan of Care (Signed)
  Problem: Education: Goal: Knowledge of disease or condition will improve Outcome: Progressing Goal: Knowledge of patient specific risk factors will improve (DELETE if not current risk factor) Outcome: Progressing   Problem: Ischemic Stroke/TIA Tissue Perfusion: Goal: Complications of ischemic stroke/TIA will be minimized 06/07/2023 0445 by Alric Jensen, RN Outcome: Progressing 06/07/2023 0052 by Alric Jensen, RN Outcome: Progressing   Problem: Coping: Goal: Will identify appropriate support needs Outcome: Progressing

## 2023-06-07 NOTE — Plan of Care (Signed)
  Problem: Ischemic Stroke/TIA Tissue Perfusion: Goal: Complications of ischemic stroke/TIA will be minimized Outcome: Progressing   Problem: Coping: Goal: Will identify appropriate support needs Outcome: Progressing

## 2023-06-07 NOTE — Discharge Summary (Signed)
 Physician Discharge Summary   Patient: Edward Cabrera MRN: [id number] DOB: 02-17-1960  Admit date:     06/05/2023  Discharge date: 06/07/23  Discharge Physician: Edward Cabrera   PCP: Edward Siemens, NP   Recommendations at discharge:  {Tip this will not be part of the note when signed- Example include specific recommendations for outpatient follow-up, pending tests to follow-up on. (Optional):26781} PCP follow up in 1 week. 30 day holter monitor to be arranged by cardiology. He will need sleep study as outpatient. Stroke clinic follow up in 1 month  Discharge Diagnoses: Principal Problem:   Acute CVA (cerebrovascular accident) Edward Cabrera) Active Problems:   Uncontrolled type 2 diabetes mellitus with hyperglycemia, without long-term current use of insulin (HCC)   Essential hypertension   Hyperlipidemia   Obesity (BMI 30.0-34.9)  Resolved Problems:   * No resolved Cabrera problems. *  Cabrera Course: Edward Cabrera is a 63 y.o. male with medical history significant for uncontrolled hypertension, uncontrolled type 2 diabetes, CKD 3B, prior history of pulmonary embolism not on oral anticoagulation, who presents with 1 week history of progressively worsening right arm and right hand weakness.  He initially thought it was related to his right shoulder nerve pain.  He was dropping things with his right hand but did not know why.  States he ran out of his blood pressure medications and was off of them for about a month.  Restarted taking his home medications about a week ago.  He initially went to his orthopedic surgery clinic today.  While he was there, he was advised to go to the ER for his right hand weakness and to rule out a stroke.   Assessment and Plan: ***     {Tip this will not be part of the note when signed Body mass index is 34.97 kg/m. , ,  (Optional):26781}  {(NOTE) Pain control PDMP Statment (Optional):26782} Consultants: Neurology Procedures performed: none   Disposition: Home Diet recommendation:  Discharge Diet Orders (From admission, onward)     Start     Ordered   06/07/23 0000  Diet - low sodium heart healthy        06/07/23 1358   06/07/23 0000  Diet Carb Modified        06/07/23 1358           Cardiac and Carb modified diet DISCHARGE MEDICATION: Allergies as of 06/07/2023       Reactions   Iohexol     Code: HIVES, Desc: PT. WAS GIVEN 50 MG BENADRLY IV 30 MINUTES PRE-PROCEDURE W/ NO REACTION-----ARS 10-21-05, Onset Date: [id number]        Medication List     TAKE these medications    amLODipine  10 MG tablet Commonly known as: NORVASC  Take 1 tablet (10 mg total) by mouth daily.   aspirin EC 81 MG tablet Take 1 tablet (81 mg total) by mouth daily. Swallow whole. Start taking on: June 08, 2023   atorvastatin  80 MG tablet Commonly known as: LIPITOR Take 1 tablet (80 mg total) by mouth daily. Start taking on: June 08, 2023   clopidogrel 75 MG tablet Commonly known as: PLAVIX Take 1 tablet (75 mg total) by mouth daily. Start taking on: June 08, 2023   glimepiride 2 MG tablet Commonly known as: Amaryl Take 1 tablet (2 mg total) by mouth every morning.   lisinopril -hydrochlorothiazide  10-12.5 MG tablet Commonly known as: ZESTORETIC  Take 1 tablet by mouth daily.   Ultra Omega-3 Fish Oil 1400 MG Caps  Take 2,800 mg by mouth daily.        Follow-up Information     [facility]. Schedule an appointment as soon as possible for a visit.   Specialty: Rehabilitation Contact information: [address] [place]  [address] [phone]               Discharge Exam: Edward Cabrera Weights   06/05/23 1608  Weight: 104.3 kg      06/07/2023   12:24 PM 06/07/2023    7:53 AM 06/07/2023    4:55 AM  Vitals with BMI  Systolic 143 154 914  Diastolic 109 111 92  Pulse 93 83 77   General - Elderly obese  African American male, no apparent distress HEENT - PERRLA, EOMI,  atraumatic head, non tender sinuses. Lung - Clear, basal rales, rhonchi, no wheezes. Heart - S1, S2 heard, no murmurs, rubs, trace pedal edema. Abdomen - Soft, non tender, obese, bowel sounds good Neuro - Alert, awake and oriented x 3, right arm weakness Skin - Warm and dry.  Condition at discharge: stable  The results of significant diagnostics from this hospitalization (including imaging, microbiology, ancillary and laboratory) are listed below for reference.   Imaging Studies: ECHOCARDIOGRAM COMPLETE Result Date: 06/06/2023    ECHOCARDIOGRAM REPORT   Patient Name:   Edward Cabrera Date of Exam: 06/06/2023 Medical Rec #:  [id number]     Height:       68.0 in Accession #:    [id number]    Weight:       230.0 lb Date of Birth:  Jun 08, 1960    BSA:          2.169 m Patient Age:    62 years      BP:           150/138 mmHg Patient Gender: M             HR:           68 bpm. Exam Location:  Inpatient Procedure: 2D Echo, Color Doppler and Cardiac Doppler (Both Spectral and Color            Flow Doppler were utilized during procedure). Indications:    stroke  History:        Patient has no prior history of Echocardiogram examinations.  Sonographer:    Melissa Morford RDCS (AE, PE) Referring Phys: SALMAN KHALIQDINA IMPRESSIONS  1. Left ventricular ejection fraction, by estimation, is 45 to 50%. The left ventricle has mildly decreased function. The left ventricle has no regional wall motion abnormalities. There is moderate concentric left ventricular hypertrophy. Left ventricular diastolic parameters are consistent with Grade I diastolic dysfunction (impaired relaxation).  2. Right ventricular systolic function is mildly reduced. The right ventricular size is mildly enlarged. Tricuspid regurgitation signal is inadequate for assessing PA pressure.  3. The mitral valve is normal in structure. Trivial mitral valve regurgitation. No evidence of mitral stenosis.  4. The aortic valve is tricuspid. There is mild  calcification of the aortic valve. Aortic valve regurgitation is not visualized. No aortic stenosis is present. FINDINGS  Left Ventricle: Left ventricular ejection fraction, by estimation, is 45 to 50%. The left ventricle has mildly decreased function. The left ventricle has no regional wall motion abnormalities. The left ventricular internal cavity size was normal in size. There is moderate concentric left ventricular hypertrophy. Left ventricular diastolic parameters are consistent with Grade I diastolic dysfunction (impaired relaxation). Right Ventricle: The right ventricular size is mildly  enlarged. No increase in right ventricular wall thickness. Right ventricular systolic function is mildly reduced. Tricuspid regurgitation signal is inadequate for assessing PA pressure. Left Atrium: Left atrial size was normal in size. Right Atrium: Right atrial size was normal in size. Pericardium: There is no evidence of pericardial effusion. Mitral Valve: The mitral valve is normal in structure. Trivial mitral valve regurgitation. No evidence of mitral valve stenosis. Tricuspid Valve: The tricuspid valve is normal in structure. Tricuspid valve regurgitation is trivial. No evidence of tricuspid stenosis. Aortic Valve: The aortic valve is tricuspid. There is mild calcification of the aortic valve. Aortic valve regurgitation is not visualized. No aortic stenosis is present. Pulmonic Valve: The pulmonic valve was normal in structure. Pulmonic valve regurgitation is trivial. No evidence of pulmonic stenosis. Aorta: The aortic root is normal in size and structure. Venous: The inferior vena cava was not well visualized. IAS/Shunts: No atrial level shunt detected by color flow Doppler.  LEFT VENTRICLE PLAX 2D LVIDd:         4.40 cm      Diastology LVIDs:         3.30 cm      LV e' medial:    3.81 cm/s LV PW:         1.10 cm      LV E/e' medial:  11.8 LV IVS:        1.40 cm      LV e' lateral:   6.42 cm/s LVOT diam:     2.10 cm       LV E/e' lateral: 7.0 LV SV:         58 LV SV Index:   27 LVOT Area:     3.46 cm  LV Volumes (MOD) LV vol d, MOD A2C: 83.4 ml LV vol d, MOD A4C: 119.0 ml LV vol s, MOD A2C: 59.4 ml LV vol s, MOD A4C: 49.1 ml LV SV MOD A2C:     24.0 ml LV SV MOD A4C:     119.0 ml LV SV MOD BP:      49.1 ml RIGHT VENTRICLE RV S prime:     13.10 cm/s TAPSE (M-mode): 2.3 cm LEFT ATRIUM             Index        RIGHT ATRIUM           Index LA diam:        4.60 cm 2.12 cm/m   RA Area:     18.30 cm LA Vol (A2C):   58.4 ml 26.93 ml/m  RA Volume:   47.30 ml  21.81 ml/m LA Vol (A4C):   34.5 ml 15.91 ml/m LA Biplane Vol: 46.7 ml 21.53 ml/m  AORTIC VALVE LVOT Vmax:   85.40 cm/s LVOT Vmean:  62.600 cm/s LVOT VTI:    0.167 m  AORTA Ao Root diam: 3.60 cm Ao Asc diam:  3.40 cm MITRAL VALVE MV Area (PHT): 3.33 cm    SHUNTS MV Decel Time: 228 msec    Systemic VTI:  0.17 m MV E velocity: 45.10 cm/s  Systemic Diam: 2.10 cm MV A velocity: 66.00 cm/s MV E/A ratio:  0.68 Jules Oar MD Electronically signed by Jules Oar MD Signature Date/Time: 06/06/2023/6:20:54 PM    Final    MR ANGIO NECK W WO CONTRAST Result Date: 06/06/2023 EXAM: MRA Neck without and with contrast 06/06/2023 05:00:00 AM TECHNIQUE: Multiplanar multisequence MRA of the neck was performed without and with the administration of 10mL  intravenous gadobutrol (GADAVIST) 1 MMOL/ML injection. COMPARISON: None available CLINICAL HISTORY: Neuro deficit, acute, stroke suspected. FINDINGS: CAROTID ARTERIES: The right carotid artery including the bifurcation is normal. Decreased flow is present in the left internal carotid artery. A high-grade stenosis is confirmed within the cavernous left ICA. No other significant carotid stenosis are present in the neck. VERTEBRAL ARTERIES: The time of flight images demonstrate no significant antegrade flow within the right vertebral artery. Flow is antegrade in the left vertebral artery. The postcontrast images confirm occlusion of the right  vertebral artery. The right V4 segment is reconstituted with a high-grade distal stenosis at the vertebral basilar junction. The left vertebral artery and the basilar artery are normal. IMPRESSION: 1. 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 . 2. 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 High-grade tandem distal left vertebral artery stenosis at the vertebral basilar junction. Electronically signed by: Audree Leas MD 06/06/2023 05:42 AM EDT RP Workstation: [id number]   MR ANGIO HEAD WO CONTRAST Result Date: 06/06/2023 EXAM: MR Angiography Head without intravenous Contrast. 06/06/2023 05:01:04 AM TECHNIQUE: Magnetic resonance angiography images of the head without intravenous contrast. Three-dimensional MIP reformations performed. COMPARISON: MR head without contrast 06/05/2023. CLINICAL HISTORY: Neuro deficit, acute, stroke suspected. 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 FINDINGS: ANTERIOR CIRCULATION: Focal signal loss within the cavernous left ICA suggests a high-grade stenosis. Decreased signal is present in the more distal left ICA and MCA branch vessels without other focal stenosis. Decreased signal is present in the left A1 segment. Normal signal is present in the A2 segments bilaterally. POSTERIOR CIRCULATION: The distal vertebral arteries are not imaged. A dominant right iliac vessel is present. The basilar artery is normal. The branch vessels are within normal limits bilaterally. IMPRESSION: 1. Focal signal loss within the cavernous left ICA suggests a high-grade stenosis. 2. Decreased signal in the more distal left ICA, MCA branch vessels, and left A1 segment without other focal stenosis. Most likely reflects decreased flow in these vessels secondary to the more proximal stenosis. Electronically signed by: Audree Leas MD 06/06/2023 05:36 AM EDT RP  Workstation: [id number]   MR Cervical Spine Wo Contrast Result Date: 06/05/2023 CLINICAL DATA:  Myelopathy, acute, cervical spine EXAM: MRI CERVICAL SPINE WITHOUT CONTRAST TECHNIQUE: Multiplanar, multisequence MR imaging of the cervical spine was performed. No intravenous contrast was administered. COMPARISON:  None Available. FINDINGS: Alignment: No substantial sagittal subluxation. Vertebrae: No fracture, evidence of discitis, or bone lesion. Cord: Normal cord signal. Posterior Fossa, vertebral arteries, paraspinal tissues: Abnormal right vertebral artery flow void. Disc levels: C2-C3: No significant disc protrusion, foraminal stenosis, or canal stenosis. C3-C4: Bilateral facet and uncovertebral hypertrophy with mild-to-moderate bilateral foraminal stenosis. Patent canal. C4-C5: Bilateral facet uncovertebral hypertrophy with mild-to-moderate left greater than right foraminal stenosis. Patent canal. C5-C6: Right uncovertebral hypertrophy with mild right foraminal stenosis. Patent canal and left foramen. C6-C7: Right greater left facet uncovertebral hypertrophy. Resulting moderate right foraminal stenosis. Patent canal and left foramen. C7-T1: No significant disc protrusion, foraminal stenosis, or canal stenosis. IMPRESSION: 1. Moderate right foraminal stenosis C6-C7. 2. Mild-to-moderate bilateral foraminal stenosis C3-C4 and C4-C5. 3. Abnormal right vertebral artery flow void. Recommend CTA head/neck to further evaluate for suspected occlusion or significant stenosis. Electronically Signed   By: Stevenson Elbe M.D.   On: 06/05/2023 23:20   MR BRAIN WO CONTRAST Result Date: 06/05/2023 CLINICAL DATA:  Neuro deficit, acute, stroke suspected EXAM: MRI HEAD  WITHOUT CONTRAST TECHNIQUE: Multiplanar, multiecho pulse sequences of the brain and surrounding structures were obtained without intravenous contrast. COMPARISON:  CT head from today. 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  Associated edema without substantial mass effect or midline shift. Possible mild petechial hemorrhage. No mass occupying acute hemorrhage. No mass lesion or hydrocephalus. Vascular: Poor right intradural vertebral artery flow void. Skull and upper cervical spine: Normal marrow signal. Sinuses/Orbits: Negative. Other: No mastoid effusions. IMPRESSION: 1. 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 2. Poor right intradural vertebral artery flow void. This could be secondary to small/nondominant vessel; however, recommend CTA head/neck to further characterize and exclude occlusion or stenosis. Electronically Signed   By: Stevenson Elbe M.D.   On: 06/05/2023 23:01   CT HEAD WO CONTRAST Result Date: 06/05/2023 CLINICAL DATA:  Neuro deficit, acute, stroke suspected Right-sided weakness. EXAM: CT HEAD WITHOUT CONTRAST TECHNIQUE: Contiguous axial images were obtained from the base of the skull through the vertex without intravenous contrast. RADIATION DOSE REDUCTION: This exam was performed according to the departmental dose-optimization program which includes automated exposure control, adjustment of the mA and/or kV according to patient size and/or use of iterative reconstruction technique. COMPARISON:  None Available. FINDINGS: Brain: No intracranial hemorrhage, mass effect, or midline shift. No hydrocephalus. The basilar cisterns are patent. No evidence of territorial infarct or acute ischemia. No extra-axial or intracranial fluid collection. Vascular: Atherosclerosis of skullbase vasculature without hyperdense vessel or abnormal calcification. Skull: No fracture or focal lesion. Sinuses/Orbits: Mild mucosal thickening throughout the paranasal sinuses. No fluid levels. No mastoid effusion. Other: None. IMPRESSION: No acute intracranial abnormality. Electronically Signed   By: Chadwick Colonel M.D.   On: 06/05/2023 18:11    Microbiology: No results found for  this or any previous visit.  Labs: CBC: Recent Labs  Lab 06/05/23 1610 06/05/23 1619 06/06/23 0624  WBC 5.7  --  4.9  NEUTROABS 2.3  --   --   HGB 15.1 16.0 15.6  HCT 45.7 47.0 47.3  MCV 82.0  --  83.4  PLT 235  --  189   Basic Metabolic Panel: Recent Labs  Lab 06/05/23 1610 06/05/23 1619 06/06/23 0624  NA 139 139 137  K 3.5 3.5 3.4*  CL 104 102 101  CO2 25  --  28  GLUCOSE 219* 223* 207*  BUN 13 13 12   CREATININE 1.60* 1.70* 1.43*  CALCIUM  9.6  --  9.5  MG  --   --  2.0  PHOS  --   --  2.8   Liver Function Tests: Recent Labs  Lab 06/05/23 1610  AST 26  ALT 35  ALKPHOS 73  BILITOT 0.7  PROT 7.3  ALBUMIN 3.7   CBG: Recent Labs  Lab 06/05/23 1614 06/06/23 1608 06/06/23 2102 06/07/23 0620 06/07/23 1217  GLUCAP 250* 219* 109* 167* 145*    Discharge time spent: 35 minutes.  Signed: Aisha Hove, MD [facility] 06/07/2023

## 2023-06-07 NOTE — TOC CAGE-AID Note (Signed)
 Transition of Care [facility]) - CAGE-AID Screening   Patient Details  Name: Edward Cabrera MRN: [id number] Date of Birth: 05-19-1960  Transition of [facility]) CM/SW Contact:    Jonathan Neighbor, RN Phone Number: 06/07/2023, 1:28 PM   Clinical Narrative:  Pt states he hasn't had a beer in 2 weeks. States drinks 1-2 beers with dinner. Denied the need for inpatient// outpatient alcohol counseling resources.   CAGE-AID Screening:    Have You Ever Felt You Ought to Cut Down on Your Drinking or Drug Use?: No Have People Annoyed You By Critizing Your Drinking Or Drug Use?: No Have You Felt Bad Or Guilty About Your Drinking Or Drug Use?: No Have You Ever Had a Drink or Used Drugs First Thing In The Morning to Steady Your Nerves or to Get Rid of a Hangover?: No CAGE-AID Score: 0  Substance Abuse Education Offered: Yes (refused)

## 2023-06-07 NOTE — Progress Notes (Addendum)
 STROKE TEAM PROGRESS NOTE   INTERIM HISTORY/SUBJECTIVE He is sitting up in bed.  He is working with his therapies.  He continues to have right upper extremity weakness which is unchanged.  He has no new complaints.  2D echo showed ejection fraction of 45 to 50% with moderate concentric left ventricular hypertrophy.  Left atrial size normal.  Plan to get cardiology consult. OBJECTIVE  CBC    Component Value Date/Time   WBC 4.9 06/06/2023 0624   RBC 5.67 06/06/2023 0624   HGB 15.6 06/06/2023 0624   HGB 14.9 05/21/2023 1452   HCT 47.3 06/06/2023 0624   HCT 46.4 05/21/2023 1452   PLT 189 06/06/2023 0624   PLT 225 05/21/2023 1452   MCV 83.4 06/06/2023 0624   MCV 86 05/21/2023 1452   MCH 27.5 06/06/2023 0624   MCHC 33.0 06/06/2023 0624   RDW 12.6 06/06/2023 0624   RDW 13.2 05/21/2023 1452   LYMPHSABS 2.9 06/05/2023 1610   LYMPHSABS 2.3 05/21/2023 1452   MONOABS 0.5 06/05/2023 1610   EOSABS 0.0 06/05/2023 1610   EOSABS 0.0 05/21/2023 1452   BASOSABS 0.0 06/05/2023 1610   BASOSABS 0.0 05/21/2023 1452    BMET    Component Value Date/Time   NA 137 06/06/2023 0624   NA 140 05/21/2023 1452   K 3.4 (L) 06/06/2023 0624   CL 101 06/06/2023 0624   CO2 28 06/06/2023 0624   GLUCOSE 207 (H) 06/06/2023 0624   BUN 12 06/06/2023 0624   BUN 13 05/21/2023 1452   CREATININE 1.43 (H) 06/06/2023 0624   CALCIUM  9.5 06/06/2023 0624   EGFR 49 (L) 05/21/2023 1452   GFRNONAA 56 (L) 06/06/2023 0624    IMAGING past 24 hours No results found.   Vitals:   06/06/23 2305 06/07/23 0455 06/07/23 0753 06/07/23 1224  BP: 133/79 (!) 155/92 (!) 154/111 (!) 143/109  Pulse: 79 77 83 93  Resp: 18 18 19 16   Temp: 97.6 F (36.4 C) 97.9 F (36.6 C) 98.3 F (36.8 C) 97.8 F (36.6 C)  TempSrc: Oral Oral Oral Oral  SpO2: 98% 97% 97% 97%  Weight:      Height:         PHYSICAL EXAM General:  Alert,  awake   pleasant middle-aged African-American male n no acute distress Psych:  Mood and affect  appropriate for situation CV: Regular rate and rhythm on monitor Respiratory:  Regular, unlabored respirations on room air GI: Abdomen soft and nontender   NEURO:  Mental Status: AA&Ox3, patient is able to give clear and coherent history Speech/Language: speech is without dysarthria or aphasia.  Naming, repetition, fluency, and comprehension intact.  Cranial Nerves:  II: PERRL. Visual fields full.  III, IV, VI: EOMI. Eyelids elevate symmetrically.  V: Sensation is intact to light touch and symmetrical to face.  VII: Right facial asymmetry  VIII: hearing intact to voice. IX, X: Palate elevates symmetrically. Phonation is normal.  AO:ZHYQMVHQ shrug 5/5. XII: tongue is midline without fasciculations. Motor:  RUE 3/5 with drift.  Weakness of right grip and intrinsic hand muscles orbits left over right upper extremity.  Right shoulder movements limited due to pain from injury Tone: is normal and bulk is normal Sensation- Intact to light touch bilaterally. Extinction absent to light touch to DSS.   Coordination: FTN intact bilaterally, HKS: no ataxia in BLE.No drift.  Gait- deferred  Most Recent NIH 3    ASSESSMENT/PLAN  [name] is a 63 y.o. male with history of  diabetes, hypertension, prior history of PE who presents with about 1 week history of progressively worsening right arm weakness.  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 Poor right intradural vertebral artery flow void.  MRA  High-grade stenosis within the cavernous left internal carotid artery limits flow within the cervical left ICA. Occlusion of the right vertebral artery with reconstitution of the right V4 segment. High-grade tandem distal left vertebral artery stenosis at the vertebral basilar junction. LDL 164 HgbA1c  9.0 Echo VTE prophylaxis - lovenox No antithrombotic prior to admission, now on aspirin 81 mg daily and clopidogrel 75 mg daily for  3 months and then ASA alone. Therapy recommendations:  Pending Disposition:  Pending   Severe L cavernous ICA  Medical management with DAPT 90 days HOB at 30 degrees or less until 1 pm and then can work with PT/OT Consider outpatient consult to neuro IR  Hypertension Home meds:  lisinopril -hydrochlorothiazide , norvasc   Stable Blood Pressure Goal: BP less than 220/110   Hyperlipidemia LDL 164, goal < 70 Add Atorvastatin    Continue statin at discharge  Diabetes type II Uncontrolled HgbA1c 9.0, goal < 7.0 CBGs, SSI Recommend close follow-up with PCP for better DM control  Dysphagia Patient has post-stroke dysphagia, SLP consulted    Diet   Diet heart healthy/carb modified Fluid consistency: Thin   Advance diet as tolerated  Other Stroke Risk Factors Obesity, Body mass index is 34.97 kg/m., BMI >/= 30 associated with increased stroke risk, recommend weight loss, diet and exercise as appropriate   Shoulder injury  Continue outpatient ortho follow up    Patient presented with 1 week history of right upper extremity weakness secondary to left MCA branch infarct symptomatic from subjective left cavernous carotid stenosis.  Recommend dual antiplatelet therapy with aspirin and Plavix for 3 months followed by aspirin alone and aggressive risk factor modification.  Patient counseled to quit smoking and to eat a healthy diet and exercise.  Will also benefit with outpatient evaluation for sleep apnea.   Mobilize out of bed.  Therapy consults.  Patient will need outpatient 30-day heart monitor for paroxysmal A-fib.  Follow-up as an outpatient stroke clinic in 2 months.  Stroke team will sign off.  Kindly call for questions.  Greater than 50% time during this  35-minute visit was spent on counseling and coordination of care and discussion with patient and care  team and answering questions.  Ardella Beaver, MD Medical Director [facility] Pager: [phone] 06/07/2023 1:55 PM   To contact Stroke Continuity provider, please refer to [email or web address]. After hours, contact General Neurology

## 2023-06-07 NOTE — Evaluation (Signed)
 Occupational Therapy Evaluation Patient Details Name: Edward Cabrera MRN: [id number] DOB: Mar 30, 1960 Today's Date: 06/07/2023   History of Present Illness   Pt is a 63 yo male admitted to [facility] on 06/05/23 with 1 week hx of worsening RUE weakness, while in ED pt developed R facial droop and slurry speech. MRI  demonstrated L frontal and L occipital infarcts. PMH of PE, CKD,DM II, HTN.     Clinical Impressions Pt admitted for above, PTA pt reports working and being ind with ADLs/iADLs including driving. Pt currently presenting with flaccid dominant RUE, impacting his functional performance in ADLs. Pt needing Mod A to setup A for ADLs using his functional LUE. Educated pt on hemi dressing techniques this session, he would greatly benefit from working further with acute OT to progress with functioning of RUE and promote independence with ADLs. Recommend pt attention Outpatient OT services for listed deficits, he states someone is always available at home and has adequate transport.      If plan is discharge home, recommend the following:   A little help with bathing/dressing/bathroom;Assistance with cooking/housework;Assist for transportation     Functional Status Assessment   Patient has had a recent decline in their functional status and demonstrates the ability to make significant improvements in function in a reasonable and predictable amount of time.     Equipment Recommendations   None recommended by OT     Recommendations for Other Services         Precautions/Restrictions   Precautions Precautions: Fall Recall of Precautions/Restrictions: Intact Restrictions Weight Bearing Restrictions Per Provider Order: No     Mobility Bed Mobility Overal bed mobility: Modified Independent             General bed mobility comments: TO get to EOB    Transfers                          Balance Overall balance assessment: Mild deficits observed, not formally  tested                                         ADL either performed or assessed with clinical judgement   ADL Overall ADL's : Needs assistance/impaired Eating/Feeding: Sitting;Minimal assistance Eating/Feeding Details (indicate cue type and reason): Pt reports challenges with eating, setting up food as he is not able to use his dominant hand to assist with feeding. Would benefit from dycem to hold food in place but acute rehab team does not have any. Grooming: Sitting;Set up Grooming Details (indicate cue type and reason): Using LUE, pt reports manageing to brush his teeth at home without challenge using one handed strategies. Upper Body Bathing: Sitting;Moderate assistance Upper Body Bathing Details (indicate cue type and reason): unable to reach and wash L side of body. Lower Body Bathing: Sit to/from stand;Contact guard assist   Upper Body Dressing : Sitting;Cueing for compensatory techniques;Cueing for sequencing;Contact guard assist Upper Body Dressing Details (indicate cue type and reason): Educated pt on first in and last out technique, min cues for new learning. Lower Body Dressing: Sitting/lateral leans;Contact guard assist Lower Body Dressing Details (indicate cue type and reason): Educated pt on the use of functional  L hand to spread sock open + propping LEs on bed for easier access.  Vision   Vision Assessment?: Yes Eye Alignment: Within Functional Limits Ocular Range of Motion: Within Functional Limits Alignment/Gaze Preference: Within Defined Limits Tracking/Visual Pursuits: Able to track stimulus in all quads without difficulty Saccades: Within functional limits Convergence: Within functional limits Visual Fields: No apparent deficits     Perception Perception: Within Functional Limits       Praxis Praxis: WFL       Pertinent Vitals/Pain Pain Assessment Pain Assessment: No/denies pain     Extremity/Trunk  Assessment Upper Extremity Assessment Upper Extremity Assessment: Generalized weakness (LUE WFL.) RUE Deficits / Details: R arm flaccid, no shoulder or scapular movement.   Lower Extremity Assessment Lower Extremity Assessment: Overall WFL for tasks assessed   Cervical / Trunk Assessment Cervical / Trunk Assessment: Normal   Communication Communication Communication: No apparent difficulties   Cognition Arousal: Alert Behavior During Therapy: WFL for tasks assessed/performed Cognition: No apparent impairments             OT - Cognition Comments: Pt seemingly stating he is ready to return home and get back to working, he does have awareness of his deficts and demonstrates good problem solving HOWEVER pt seems to be more focused on return to routine life than recovery.                 Following commands: Intact       Cueing  General Comments   Cueing Techniques: Verbal cues;Tactile cues  VSS; phlebotomy tech in room to gather pt's labs.   Exercises     Shoulder Instructions      Home Living Family/patient expects to be discharged to:: Private residence Living Arrangements: Parent Available Help at Discharge: Family;Available 24 hours/day Type of Home: House Home Access: Stairs to enter [employer] of Steps: 3 Entrance Stairs-Rails: Left;Right Home Layout: One level     Bathroom Shower/Tub: Chief Strategy Officer: Standard Bathroom Accessibility: Yes   Home Equipment: Agricultural consultant (2 wheels);Cane - single point;Hand held shower head   Additional Comments: Pt reports a couple of falls at work however appear to be mechanical in nature.      Prior Functioning/Environment Prior Level of Function : Independent/Modified Independent;Driving;Working/employed;History of Falls (last six months)             Mobility Comments: Ind ADLs Comments: Ind    OT Problem List: Impaired UE functional use;Decreased strength;Decreased range  of motion;Impaired balance (sitting and/or standing)   OT Treatment/Interventions: Self-care/ADL training;Patient/family education;Therapeutic exercise;Neuromuscular education;Therapeutic activities      OT Goals(Current goals can be found in the care plan section)   Acute Rehab OT Goals Patient Stated Goal: to go home OT Goal Formulation: With patient Time For Goal Achievement: 06/21/23 Potential to Achieve Goals: Good ADL Goals Pt Will Perform Eating: with set-up;sitting (with AE prn) Pt Will Perform Upper Body Bathing: standing;with set-up;with adaptive equipment Pt Will Perform Upper Body Dressing: with modified independence;standing;sitting Pt Will Perform Lower Body Dressing: sit to/from stand;with modified independence Pt Will Perform Toileting - Clothing Manipulation and hygiene: with modified independence;sit to/from stand Pt/caregiver will Perform Home Exercise Program: Increased ROM;Right Upper extremity;With written HEP provided   OT Frequency:  Min 2X/week    Co-evaluation              AM-PAC OT "6 Clicks" Daily Activity     Outcome Measure Help from another person eating meals?: A Little Help from another person taking care of personal grooming?: A Little Help from another person toileting, which  includes using toliet, bedpan, or urinal?: A Little Help from another person bathing (including washing, rinsing, drying)?: A Lot Help from another person to put on and taking off regular upper body clothing?: A Lot Help from another person to put on and taking off regular lower body clothing?: A Little 6 Click Score: 16   End of Session    Activity Tolerance: Patient tolerated treatment well Patient left: in bed;with call bell/phone within reach;Other (comment) (sitting EOB, phlebotomy at bedside)  OT Visit Diagnosis: Hemiplegia and hemiparesis;Muscle weakness (generalized) (M62.81);History of falling (Z91.81) Hemiplegia - Right/Left: Right Hemiplegia -  dominant/non-dominant: Dominant Hemiplegia - caused by: Cerebral infarction                Time: 1610-9604 OT Time Calculation (min): 42 min Charges:  OT General Charges $OT Visit: 1 Visit OT Evaluation $OT Eval Moderate Complexity: 1 Mod OT Treatments $Self Care/Home Management : 8-22 mins $Therapeutic Activity: 8-22 mins  06/07/2023  AB, OTR/L  Acute Rehabilitation Services  Office: [phone]   Jorene New 06/07/2023, 1:20 PM

## 2023-06-08 ENCOUNTER — Telehealth: Payer: Self-pay | Admitting: *Deleted

## 2023-06-08 NOTE — Transitions of Care (Post Inpatient/ED Visit) (Signed)
   06/08/2023  Name: Edward Cabrera MRN: [id number] DOB: [birth year]  Today's TOC FU Call Status: Today's TOC FU Call Status:: Unsuccessful Call (1st Attempt) Unsuccessful Call (1st Attempt) Date: 06/08/23  Attempted to reach the patient regarding the most recent Inpatient/ED visit.  Follow Up Plan: Additional outreach attempts will be made to reach the patient to complete the Transitions of Care (Post Inpatient/ED visit) call.   Arna Better RN, BSN [place]  [facility] RN Care Manager [phone]

## 2023-06-11 ENCOUNTER — Telehealth: Payer: Self-pay | Admitting: *Deleted

## 2023-06-11 NOTE — Transitions of Care (Post Inpatient/ED Visit) (Signed)
   06/11/2023  Name: Edward Cabrera MRN: [id number] DOB: Apr 17, 1960  Today's TOC FU Call Status: Today's TOC FU Call Status:: Unsuccessful Call (2nd Attempt) Unsuccessful Call (2nd Attempt) Date: 06/11/23  Attempted to reach the patient regarding the most recent Inpatient/ED visit.  Follow Up Plan: Additional outreach attempts will be made to reach the patient to complete the Transitions of Care (Post Inpatient/ED visit) call.   Una Ganser BSN RN [place] [facility] Care Management Coordinator [email or web address] Direct Dial: [phone]  Fax: [phone] Website: [email or web address]

## 2023-06-12 ENCOUNTER — Telehealth (INDEPENDENT_AMBULATORY_CARE_PROVIDER_SITE_OTHER): Payer: Self-pay | Admitting: Primary Care

## 2023-06-12 ENCOUNTER — Telehealth: Payer: Self-pay | Admitting: *Deleted

## 2023-06-12 NOTE — Telephone Encounter (Signed)
 Called pt to confirm appt.Pt did not answer and could not LVM.

## 2023-06-12 NOTE — Transitions of Care (Post Inpatient/ED Visit) (Signed)
 06/12/2023  Name: Edward Cabrera MRN: [id number] DOB: 08-16-1960  Today's TOC FU Call Status: Today's TOC FU Call Status:: Successful TOC FU Call Completed TOC FU Call Complete Date: 06/12/23 Patient's Name and Date of Birth confirmed.  Transition Care Management Follow-up Telephone Call Date of Discharge: 06/07/23 Discharge Facility: [facility]) Type of Discharge: Inpatient Admission Primary Inpatient Discharge Diagnosis:: Acute CVA How have you been since you were released from the hospital?: Better Any questions or concerns?: No  Items Reviewed: Did you receive and understand the discharge instructions provided?: Yes Medications obtained,verified, and reconciled?: Yes (Medications Reviewed) Any new allergies since your discharge?: No Dietary orders reviewed?: Yes Type of Diet Ordered:: low sodium heart healthy Do you have support at home?: Yes People in Home [RPT]: other relative(s) Name of Support/Comfort Primary Source: Patient did not provide name  Medications Reviewed Today: Medications Reviewed Today     Reviewed by Aura Leeds, RN (Registered Nurse) on 06/12/23 at 1121  Med List Status: <None>   Medication Order Taking? Sig Documenting Provider Last Dose Status Informant  amLODipine  (NORVASC ) 10 MG tablet [id number] Yes Take 1 tablet (10 mg total) by mouth daily. Marius Siemens, NP Taking Active Self, Pharmacy Records  aspirin  EC 81 MG tablet [id number] Yes Take 1 tablet (81 mg total) by mouth daily. Swallow whole. Aisha Hove, MD Taking Active   atorvastatin  (LIPITOR ) 80 MG tablet [id number] Yes Take 1 tablet (80 mg total) by mouth daily. Aisha Hove, MD Taking Active   clopidogrel  (PLAVIX ) 75 MG tablet [id number] Yes Take 1 tablet (75 mg total) by mouth daily. Aisha Hove, MD Taking Active   glimepiride  (AMARYL ) 2 MG tablet [id number] Yes Take 1 tablet (2 mg total) by mouth every morning. Aisha Hove, MD Taking Active    lisinopril -hydrochlorothiazide  (ZESTORETIC ) 10-12.5 MG tablet [id number] Yes Take 1 tablet by mouth daily. Marius Siemens, NP Taking Active Self, Pharmacy Records  Omega-3 Fatty Acids (ULTRA OMEGA-3 FISH OIL ) 1400 MG CAPS [id number] Yes Take 2,800 mg by mouth daily. [provider] Taking Active Self, Pharmacy Records            Home Care and Equipment/Supplies: Were Home Health Services Ordered?: No Any new equipment or medical supplies ordered?: No  Functional Questionnaire: Do you need assistance with bathing/showering or dressing?: No Do you need assistance with meal preparation?: Yes Do you need assistance with eating?: No Do you have difficulty maintaining continence: No Do you need assistance with getting out of bed/getting out of a chair/moving?: No Do you have difficulty managing or taking your medications?: No  Follow up appointments reviewed: PCP Follow-up appointment confirmed?: Yes Date of PCP follow-up appointment?: 06/13/23 Follow-up Provider: Madelyn Schick Specialist [facility] Follow-up appointment confirmed?: Yes Date of Specialist follow-up appointment?: 08/09/23 Follow-Up Specialty Provider:: Cardiologist Do you need transportation to your follow-up appointment?: No Do you understand care options if your condition(s) worsen?: Yes-patient verbalized understanding  SDOH Interventions Today    Flowsheet Row Most Recent Value  SDOH Interventions   Food Insecurity Interventions Intervention Not Indicated  Housing Interventions Intervention Not Indicated  Transportation Interventions Intervention Not Indicated  Utilities Interventions Intervention Not Indicated       Goals Addressed             This Visit's Progress    VBCI Transitions of Care (TOC) Care Plan       Problems:  Recent Hospitalization for treatment of CVA Knowledge Deficit Related to preventive care  Goal:  Over  the next 30 days, the patient will not experience hospital  readmission  Interventions:  Transitions of Care: Doctor Visits  - discussed the importance of doctor visits Post discharge activity limitations prescribed by provider reviewed Medication review, discussed the importance of requesting refills 3-4 days prior to running out Reviewed upcoming appointments including:06/13/23 with PCP and 06/20/23 for Neuro Rehab PT/OT evaluation  Stroke: Reviewed Importance of taking all medications as prescribed Reviewed Importance of attending all scheduled provider appointments Advised to report any changes in symptoms or exercise tolerance Screening for signs and symptoms of depression related to chronic disease state Assessed social determinant of health barriers Reviewed the importance of exercise Assessed for cognitive impairment Assessed for fall status and safety in the home  Patient Self Care Activities:  Attend all scheduled provider appointments Call provider office for new concerns or questions  Participate in Transition of Care Program/Attend TOC scheduled calls Take medications as prescribed    Plan:  Telephone follow up appointment with care management team member scheduled for:  06/19/23 at 2:30pm        Arna Better RN, BSN [place]  [facility] RN Care Manager [phone]

## 2023-06-13 ENCOUNTER — Encounter (INDEPENDENT_AMBULATORY_CARE_PROVIDER_SITE_OTHER): Payer: Self-pay | Admitting: Primary Care

## 2023-06-13 ENCOUNTER — Ambulatory Visit (INDEPENDENT_AMBULATORY_CARE_PROVIDER_SITE_OTHER): Admitting: Primary Care

## 2023-06-13 ENCOUNTER — Telehealth: Payer: Self-pay

## 2023-06-13 VITALS — BP 164/117 | HR 104 | Resp 16 | Wt 214.8 lb

## 2023-06-13 DIAGNOSIS — I639 Cerebral infarction, unspecified: Secondary | ICD-10-CM

## 2023-06-13 DIAGNOSIS — I10 Essential (primary) hypertension: Secondary | ICD-10-CM

## 2023-06-13 DIAGNOSIS — G4733 Obstructive sleep apnea (adult) (pediatric): Secondary | ICD-10-CM

## 2023-06-13 DIAGNOSIS — E66811 Obesity, class 1: Secondary | ICD-10-CM

## 2023-06-13 NOTE — Telephone Encounter (Signed)
 Spoke with pt and his caregiver regarding the zio heart monitor. Pt stated he is unable to get into MyChart to read the instructions sent. Caregiver stated that pt is unable to place the zio due to weakness after a stroke. The caregiver stated she does not understand how to place the zio. Pt was told this information would be forwarded to the monitor team for assistance. An appointment may be necessary to place monitor. Pt aware. Pt verbalized understanding. All questions if any were answered.

## 2023-06-13 NOTE — Telephone Encounter (Signed)
 Talked patient through applying his [employer] 30 day cardiac event monitor and giving instructions for use and how to return device after 30 days.

## 2023-06-13 NOTE — Progress Notes (Signed)
 Subjective:    Edward Cabrera is a 63 y.o. male presents for hospital follow up. Admit date to the hospital was 06/05/23, patient was discharged from the hospital on 06/07/23, patient was admitted for: t presented from home through orthopedic surgery office for right arm and right hand weakness.   He initially thought this was related to his right shoulder and arm pain.  When he began to drop things trying to use his right hand and out of blood pressure medication for over a month MRI revealed acute left posterior frontal and left occipital infarcts was suspected mild petechial hemorrhage.  Today his right hand is swollen and inability to use hand at all.  He initially presented for placement of a heart monitor that was sent to his house.  Explained to patient I am not aware of how to do this let me check with the cardiologist who saw you in the hospital.  Called and spoke with Dr. Lavonne Prairie he stated patient will be called and heart monitor be placed and taught how to do and a follow-up appointment will be scheduled with Dr. Lavonne Prairie.  Patient has been made aware that cardiology will be calling him to schedule an appointment and at that appointment he will take the heart monitor kit.  He also has paperwork for FMLA he is unable to use his right hand (dominant ) 6 has 15 days to return the information will have paperwork completed by no  later then 06/22/2023 he will be called if completed prior to to come and pick up. Blood pressure today is also uncontrolled 164/117 and he states he is taking his medication as prescribed.  He has been having shortness of breath and headaches.  Will send Dr. Lavonne Prairie his current blood pressure and retake.  150/102. Past Medical History:  Diagnosis Date   Diabetes mellitus without complication (HCC)    Gout    Hypertension    Pulmonary embolism (HCC)      Allergies  Allergen Reactions   Iohexol      Code: HIVES, Desc: PT. WAS GIVEN 50 MG BENADRLY IV 30 MINUTES  PRE-PROCEDURE W/ NO REACTION-----ARS 10-21-05, Onset Date: [id number]     Current Outpatient Medications on File Prior to Visit  Medication Sig Dispense Refill   amLODipine  (NORVASC ) 10 MG tablet Take 1 tablet (10 mg total) by mouth daily. 90 tablet 1   aspirin  EC 81 MG tablet Take 1 tablet (81 mg total) by mouth daily. Swallow whole. 30 tablet 12   atorvastatin  (LIPITOR ) 80 MG tablet Take 1 tablet (80 mg total) by mouth daily. 90 tablet 1   clopidogrel  (PLAVIX ) 75 MG tablet Take 1 tablet (75 mg total) by mouth daily. 90 tablet 0   glimepiride  (AMARYL ) 2 MG tablet Take 1 tablet (2 mg total) by mouth every morning. 90 tablet 1   lisinopril -hydrochlorothiazide  (ZESTORETIC ) 10-12.5 MG tablet Take 1 tablet by mouth daily. 90 tablet 1   Omega-3 Fatty Acids (ULTRA OMEGA-3 FISH OIL ) 1400 MG CAPS Take 2,800 mg by mouth daily.     No current facility-administered medications on file prior to visit.    Review of System: ROS Comprehensive ROS Pertinent positive and negative noted in HPI   Objective:  BP (!) 164/117 (BP Location: Left Arm, Patient Position: Sitting, Cuff Size: Large)   Pulse (!) 104   Resp 16   Wt 214 lb 12.8 oz (97.4 kg)   SpO2 98%   BMI 32.66 kg/m   [employer]  06/13/23 1120  Weight: 214 lb 12.8 oz (97.4 kg)    Physical Exam Vitals reviewed.  Constitutional:      Appearance: He is obese.  HENT:     Head: Normocephalic.     Right Ear: Tympanic membrane and external ear normal.     Left Ear: Tympanic membrane and external ear normal.     Nose: Nose normal.   Eyes:     Extraocular Movements: Extraocular movements intact.     Pupils: Pupils are equal, round, and reactive to light.    Cardiovascular:     Rate and Rhythm: Normal rate and regular rhythm.  Pulmonary:     Effort: Pulmonary effort is normal.     Breath sounds: Normal breath sounds.  Abdominal:     General: Bowel sounds are normal. There is distension.     Palpations: Abdomen is soft.    Musculoskeletal:        General: Swelling present.     Cervical back: Normal range of motion.     Comments: Right hand decrease ROM and function    Skin:    General: Skin is warm and dry.   Neurological:     Mental Status: He is oriented to person, place, and time.   Psychiatric:        Mood and Affect: Mood normal.        Behavior: Behavior normal.        Thought Content: Thought content normal.        Judgment: Judgment normal.      Assessment:  Edward Cabrera was seen today for hospitalization follow-up and form completion.  Diagnoses and all orders for this visit:  OSA (obstructive sleep apnea) -     Ambulatory referral to Sleep Studies  Acute CVA (cerebrovascular accident) (HCC) Heart monitor requested by neurology for patient s/p CVA. Followed Lisabeth Rider, MD Neurology  Essential hypertension BP goal - < 140/90 Explained that having normal blood pressure is the goal and medications are helping to get to goal and maintain normal blood pressure. DIET: Limit salt intake, read nutrition labels to check salt content, limit fried and high fatty foods  Avoid using multisymptom OTC cold preparations that generally contain sudafed which can rise BP. Consult with pharmacist on best cold relief products to use for persons with HTN EXERCISE Discussed incorporating exercise such as walking - 30 minutes most days of the week and can do in 10 minute intervals     Obesity (BMI 30.0-34.9) Obesity is 30-39 indicating an excess in caloric intake or underlining conditions. This may lead to other co-morbidities. Educated on lifestyle modifications of diet and exercise which may reduce obesity.      This note has been created with Education officer, environmental. Any transcriptional errors are unintentional.   Return in about 3 months (around 09/13/2023) for medical conditions.  Marius Siemens, NP 06/19/2023, 1:18 PM

## 2023-06-18 ENCOUNTER — Telehealth: Payer: Self-pay

## 2023-06-18 ENCOUNTER — Telehealth (INDEPENDENT_AMBULATORY_CARE_PROVIDER_SITE_OTHER): Payer: Self-pay

## 2023-06-18 DIAGNOSIS — I639 Cerebral infarction, unspecified: Secondary | ICD-10-CM

## 2023-06-18 NOTE — Telephone Encounter (Signed)
 Copied from CRM [id number]. Topic: Clinical - Medical Advice >> Jun 18, 2023 11:55 AM Sophia H wrote: Reason for CRM: Patient states he has been tracking his blood pressure and he cannot get it below 157/93 for the last few days. States he has not changed anything and has been taking medication regularly. Wants to know if he should go up on his medication & also following up on his FMLA paperwork, please advise. # [phone]

## 2023-06-18 NOTE — Transitions of Care (Post Inpatient/ED Visit) (Unsigned)
 Stroke Discharge Follow-up   06/18/2023 Name:  Edward Cabrera MRN:  [id number] DOB:  1960-04-05  Subjective: Edward Cabrera is a 63 y.o. year old male who is a primary care patient of Marius Siemens, NP An Emmi alert was received indicating patient responded to questions: Sad, hopeless, anxious, or empty?. I reached out by phone to follow up on the alert and spoke to no one. Placed call to patient with no answer. .  Care Management Interventions: attempted outreach unsuccessful for Emmi stroke  Follow up plan: Will call back in 24 hours and will alert TOC RN who has patient schedule for tomorrow.  Orpha Blade, RN, BSN, CEN [employer]- Transition of Care Team.  [facility] [phone]

## 2023-06-18 NOTE — Telephone Encounter (Signed)
 FMLA was faxed on 06/15/2023  Will forward to provider in regards to bp

## 2023-06-19 ENCOUNTER — Telehealth: Payer: Self-pay

## 2023-06-19 ENCOUNTER — Encounter (INDEPENDENT_AMBULATORY_CARE_PROVIDER_SITE_OTHER): Payer: Self-pay | Admitting: Primary Care

## 2023-06-19 ENCOUNTER — Other Ambulatory Visit: Payer: Self-pay | Admitting: *Deleted

## 2023-06-19 ENCOUNTER — Other Ambulatory Visit (INDEPENDENT_AMBULATORY_CARE_PROVIDER_SITE_OTHER): Payer: Self-pay | Admitting: Primary Care

## 2023-06-19 DIAGNOSIS — E1165 Type 2 diabetes mellitus with hyperglycemia: Secondary | ICD-10-CM

## 2023-06-19 DIAGNOSIS — I639 Cerebral infarction, unspecified: Secondary | ICD-10-CM

## 2023-06-19 MED ORDER — LANCETS MISC. MISC: 1.0000 | Freq: Three times a day (TID) | 0 refills | Status: AC

## 2023-06-19 MED ORDER — LANCET DEVICE MISC
1.0000 | Freq: Three times a day (TID) | 0 refills | Status: AC
Start: 2023-06-19 — End: 2023-07-19

## 2023-06-19 MED ORDER — BLOOD GLUCOSE TEST VI STRP: 1.0000 | ORAL_STRIP | Freq: Three times a day (TID) | 0 refills | Status: AC

## 2023-06-19 MED ORDER — BLOOD GLUCOSE MONITORING SUPPL DEVI: 1.0000 | Freq: Three times a day (TID) | 0 refills | Status: AC

## 2023-06-19 NOTE — Therapy (Signed)
 OUTPATIENT OCCUPATIONAL THERAPY NEURO EVALUATION  Patient Name: Edward Cabrera MRN: [id number] DOB:[birth year], 63 y.o., male Today's Date: 06/20/2023  PCP: Marius Siemens, NP  REFERRING PROVIDER: Aisha Hove, MD  END OF SESSION:  OT End of Session - 06/20/23 0851     Visit Number 1    Number of Visits 24    Date for OT Re-Evaluation 09/18/23    Authorization Type UHC, VL: 60 PT/OT/ST    OT Start Time 0800    OT Stop Time 0845    OT Time Calculation (min) 45 min    Activity Tolerance Patient tolerated treatment well    Behavior During Therapy WFL for tasks assessed/performed          Past Medical History:  Diagnosis Date   Diabetes mellitus without complication (HCC)    Gout    Hypertension    Pulmonary embolism (HCC)    Past Surgical History:  Procedure Laterality Date   ROTATOR CUFF REPAIR Left    Patient Active Problem List   Diagnosis Date Noted   Uncontrolled type 2 diabetes mellitus with hyperglycemia, without long-term current use of insulin  (HCC) 06/07/2023   Essential hypertension 06/07/2023   Hyperlipidemia 06/07/2023   Obesity (BMI 30.0-34.9) 06/07/2023   Acute CVA (cerebrovascular accident) (HCC) 06/05/2023    ONSET DATE: 06/07/2023 (referral date)   REFERRING DIAG: I63.9 (ICD-10-CM) - Acute CVA (cerebrovascular accident) (HCC)  THERAPY DIAG:  Hemiplegia and hemiparesis following cerebral infarction affecting right dominant side (HCC)  Other lack of coordination  Muscle weakness (generalized)  Unsteadiness on feet  Other disturbances of skin sensation  Acute pain of right shoulder  Rationale for Evaluation and Treatment: Rehabilitation  SUBJECTIVE:   SUBJECTIVE STATEMENT: I am Rt handed so I need this arm. I think my BP meds need adjusting (higher dose) Pt accompanied by: self  PERTINENT HISTORY: admitted to [facility] on 06/05/23 with 1 week hx of worsening RUE weakness, while in ED pt developed R facial droop and slurry speech.  MRI  demonstrated L frontal and L occipital infarcts. PMH of PE, CKD,DM II, HTN.   PRECAUTIONS: Other: heart monitor and no heavy lifting  WEIGHT BEARING RESTRICTIONS: No  PAIN:  Are you having pain? Rt shoulder 3-8/10 - worse when lifting arm  FALLS: Has patient fallen in last 6 months? Yes. Number of falls 1  LIVING ENVIRONMENT: Lives with: lives with their family Lives in: 1 story house with 3 steps to enter Has following equipment at home: Single point cane  PLOF: Independent, working full time as Mining engineer - requires lifting/pulling, but also typing  PATIENT GOALS: use of dominant arm and hand for self care and work  OBJECTIVE:  Note: Objective measures were completed at Evaluation unless otherwise noted.  HAND DOMINANCE: Right  ADLs: Overall ADLs: mod I to min assist Transfers/ambulation related to ADLs: independent Eating: eating with Lt non dominant hand with difficulty Grooming: with Lt non dominant hand with difficulty UB Dressing: mod I with difficulty - pull over shirts only LB Dressing: assist w/ Rt shoe and sock, wearing elastic pants b/c he can't do buttons Toileting: mod I  Bathing: mod I w/ LH sponge Tub Shower transfers: mod I  Equipment: Long handled sponge and hand held shower  IADLs: Shopping: mod I  Light housekeeping: unable to fold towels or anything that requires 2 hands Meal Prep: dependent currently (pt loves to cook and did mostly prior to CVA)  Community mobility: pt stills driving Medication management: independent Handwriting:  unable Rt dominant hand, practicing w/ Lt hand  MOBILITY STATUS: Independent   FUNCTIONAL OUTCOME MEASURES: Upper Extremity Functional Scale (UEFS): 2/80 = .025 which translates to 99.075% disability RT dominant UE  UPPER EXTREMITY ROM:  minimal shoulder movement dominated by synergy pattern and no distal control. Approx 40% elbow flex, no supination, minimal trace wrist extension and 10% finger  flex/ext   UPPER EXTREMITY MMT:   NOT TESTED D/T LIMITED MOVEMENT   HAND FUNCTION: No grip strength  COORDINATION: Pt can pick up block w/ difficulty if block is stabilized but cannot maintain  SENSATION: Light touch/localization intact, but still has numbness/tingling  EDEMA: min to mod Rt hand  MUSCLE TONE: RUE: Hypotonic  COGNITION: Overall cognitive status: Within functional limits for tasks assessed  VISION: Subjective report: At first my vision was blurry and I was seeing spots, but it's getting better. Denies diplopia Baseline vision: No visual deficits Visual history: none  VISION ASSESSMENT: Will further assess in functional context     PERCEPTION: Not tested  PRAXIS: Not tested  OBSERVATIONS: Pt with shoulder pain and movement emerging RUE although limited and dominated by synergy pattern                                                                                                                             TREATMENT DATE: 06/20/23   BP = 154/95  Pt instructed to keep BP under control and discuss further with MD if meds need to be adjusted.   Pt issued initial HEP for Rt shoulder passive and AA/ROM as tolerated - see pt instructions for details. Pt return demo of each. Pt has pain Rt shoulder but improved with correct positioning  Pt also instructed to perform wrist extension, gross composite finger flex and extension, and attempting to grasp cup, lift cup, and fully release cup as able - however did not have time to formally issue these as HEP     PATIENT EDUCATION: Education details: see above Person educated: Patient Education method: Programmer, multimedia, Demonstration, Verbal cues, and Handouts Education comprehension: verbalized understanding, returned demonstration, verbal cues required, and needs further education  HOME EXERCISE PROGRAM: 06/20/23: initial HEP for RUE/shoulder   GOALS: Goals reviewed with patient? Yes  SHORT TERM GOALS: Target  date: 07/20/23  Independent with initial HEP for RUE Baseline: Goal status: INITIAL  2.  Pt to demo low level reaching RUE (approx 45* sh flex) in prep for functional retrieving of items from lower surfaces Baseline:  Goal status: INITIAL  3.  Pt to demo 50% or greater gross composite flexion and extension Rt hand to grasp/release 1-2 cylindrical objects Baseline:  Goal status: INITIAL  4.  Pt to begin using RUE consistently as min assist for BADLS and bilateral tasks Baseline:  Goal status: INITIAL  5.  Pt to don Rt shoe and sock I'ly consistently with A/E prn Baseline:  Goal status: INITIAL  6.  Pt to improve RUE function as evidenced by  performing 5 blocks on Box & Blocks test Baseline: unable Goal status: INITIAL  LONG TERM GOALS: Target date: 09/18/23  Independent with updated HEP  Baseline:  Goal status: INITIAL  2.  Pt to perform mid level reaching to 90* sh flexion to retrieve light weight objects Baseline:  Goal status: INITIAL  3.  Pt to demo RUE function improvements as evidenced by performing 20 blocks or greater on Box & Blocks test Baseline:  Goal status: INITIAL  4.  Pt to be perform cooking tasks safely with min assist or less using A/E prn Baseline:  Goal status: INITIAL  5.  Pt to demo 20 lbs or greater grip strength Rt hand in order to sufficiently hold objects in Rt hand Baseline: 0 lbs Goal status: INITIAL  6.  Pt to demo sufficient coordination for typing 18 wpm at 90% accuracy or verbalize understanding of voice recognition software for work related tasks Baseline:  Goal status: INITIAL  7.  UEFI scale to improve by demo 70% or less disability RUE Baseline: 99.075% Goal status: INITIAL   ASSESSMENT:  CLINICAL IMPRESSION: Patient is a 63 y.o. male who was seen today for occupational therapy evaluation for CVA w/ Rt dominant side hemiplegia. Hx includes HTN, DM, CKD, PE. Patient currently presents significantly below baseline level of  functioning demonstrating functional deficits and impairments as noted below. Pt would benefit from skilled OT services in the outpatient setting to work on impairments as noted below to help pt return to PLOF as able.   Aaron Aas   PERFORMANCE DEFICITS: in functional skills including ADLs, IADLs, coordination, dexterity, proprioception, sensation, edema, tone, ROM, strength, pain, flexibility, Fine motor control, Gross motor control, mobility, balance, body mechanics, endurance, cardiopulmonary status limiting function, decreased knowledge of use of DME, vision, and UE functional use, cognitive skills including safety awareness, and psychosocial skills including coping strategies.   IMPAIRMENTS: are limiting patient from ADLs, IADLs, rest and sleep, work, leisure, and social participation.   CO-MORBIDITIES: has co-morbidities such as uncontrolled HTN that affects occupational performance. Patient will benefit from skilled OT to address above impairments and improve overall function.  MODIFICATION OR ASSISTANCE TO COMPLETE EVALUATION: No modification of tasks or assist necessary to complete an evaluation.  OT OCCUPATIONAL PROFILE AND HISTORY: Detailed assessment: Review of records and additional review of physical, cognitive, psychosocial history related to current functional performance.  CLINICAL DECISION MAKING: Moderate - several treatment options, min-mod task modification necessary  REHAB POTENTIAL: Good  EVALUATION COMPLEXITY: Moderate    PLAN:  OT FREQUENCY: 2x/week  OT DURATION: 12 weeks  PLANNED INTERVENTIONS: 97535 self care/ADL training, 40981 therapeutic exercise, 97530 therapeutic activity, 97112 neuromuscular re-education, 97140 manual therapy, 97113 aquatic therapy, 97035 ultrasound, 97018 paraffin, 19147 fluidotherapy, 97010 moist heat, 97032 electrical stimulation (manual), 97014 electrical stimulation unattended, 97760 Orthotic Initial, 97763 Orthotic/Prosthetic subsequent,  passive range of motion, functional mobility training, visual/perceptual remediation/compensation, energy conservation, coping strategies training, patient/family education, and DME and/or AE instructions  RECOMMENDED OTHER SERVICES: None at this time  CONSULTED AND AGREED WITH PLAN OF CARE: Patient  PLAN FOR NEXT SESSION: monitor BP, add to HEP as able (for assisted FA sup, wrist ext, functional grasp/release as able), closed chain sh flexion, gross grasp/release   Velinda Getting, OT 06/20/2023, 8:52 AM

## 2023-06-19 NOTE — Transitions of Care (Post Inpatient/ED Visit) (Signed)
 Stroke Discharge Follow-up   06/19/2023 Name:  KERBY BORNER MRN:  [id number] DOB:  Sep 14, 1960  Subjective: Edward Cabrera is a 63 y.o. year old male who is a primary care patient of Marius Siemens, NP An Emmi alert was received indicating patient responded to questions: Sad, hopeless, anxious, or empty?. I reached out by phone to follow up on the alert and spoke to patient.   No movement of feeling of the right arm from elbow to fingers.  Patient reports that he has therapy scheduled for tomorrow. Reports that he has been trying to do his own ROM himself and exercising his fingers. Aaron Aas   SDOH Screenings   Food Insecurity: No Food Insecurity (06/19/2023)  Housing: Unknown (06/19/2023)  Transportation Needs: No Transportation Needs (06/19/2023)  Utilities: Not At Risk (06/19/2023)  Depression (PHQ2-9): Medium Risk (06/19/2023)  Social Connections: Unknown (06/06/2023)  Tobacco Use: Low Risk  (06/13/2023)      06/19/2023   10:09 AM 06/13/2023   11:21 AM 06/12/2023   11:42 AM 05/21/2023    2:34 PM 11/15/2021   10:12 AM  Depression screen PHQ 2/9  Decreased Interest 1 1 0 0 0  Down, Depressed, Hopeless 0 1 0  0  PHQ - 2 Score 1 2 0 0 0  Altered sleeping 2 1     Tired, decreased energy 2 1     Change in appetite 1 1     Feeling bad or failure about yourself  0 1     Trouble concentrating 3 1     Moving slowly or fidgety/restless 1 1     Suicidal thoughts 0 0     PHQ-9 Score 10 8     Difficult doing work/chores Very difficult Very difficult        Care Management Interventions: SDOH assessment completed, Med reconciliation completed. Reviewed patients concerns and frustration.  Depression screening completed. Referral placed to LCSW.  Provided a listening ear and understanding of frustration related to change in function since stroke. Encouraged patient to continue to monitor BP and call MD for abnormal readings.   Follow up plan: No further intervention required.   Orpha Blade, RN, BSN,  CEN [employer]- Transition of Care Team.  [facility] [phone]

## 2023-06-19 NOTE — Transitions of Care (Post Inpatient/ED Visit) (Signed)
 Transition of Care week 2  Visit Note  06/19/2023  Name: Edward Cabrera MRN: [id number]          DOB: 1960-02-17  Situation: Patient enrolled in [facility] 30-day program. Visit completed with Mr. Jeppsen by telephone.   Background:   Initial Transition Care Management Follow-up Telephone Call    Past Medical History:  Diagnosis Date   Diabetes mellitus without complication (HCC)    Gout    Hypertension    Pulmonary embolism (HCC)     Assessment: Patient Reported Symptoms: Cognitive Cognitive Status: Able to follow simple commands, Alert and oriented to person, place, and time, Normal speech and language skills, Insightful and able to interpret abstract concepts   Health Maintenance Behaviors: Annual physical exam, Healthy diet Healing Pattern: Average Health Facilitated by: Rest, Healthy diet  Neurological Neurological Review of Symptoms: Weakness, Numbness Neurological Conditions: Stroke, ischemic Neurological Self-Management Outcome: 3 (uncertain) Neurological Comment: Right arm from elbow to hand weakness and numbness, unable to grip and squeeze  HEENT HEENT Symptoms Reported: No symptoms reported      Cardiovascular Cardiovascular Symptoms Reported: No symptoms reported Does patient have uncontrolled Hypertension?: Yes Is patient checking Blood Pressure at home?: Yes Patient's Recent BP reading at home: 06/19/23-153/93 Cardiovascular Management Strategies: Diet modification, Routine screening, Medication therapy Cardiovascular Self-Management Outcome: 3 (uncertain) Cardiovascular Comment: Scheduled with Pharmacist on 06/21/23  Respiratory Respiratory Symptoms Reported: No symptoms reported    Endocrine Patient reports the following symptoms related to hypoglycemia or hyperglycemia : No symptoms reported Is patient diabetic?: Yes Is patient checking blood sugars at home?: No Endocrine Conditions: Diabetes Endocrine Management Strategies: Diet modification, Routine screening,  Medication therapy Endocrine Self-Management Outcome: 3 (uncertain) Endocrine Comment: Patient needs a glucometer to check BS  Gastrointestinal Gastrointestinal Symptoms Reported: No symptoms reported      Genitourinary Genitourinary Symptoms Reported: No symptoms reported    Integumentary Integumentary Symptoms Reported: No symptoms reported    Musculoskeletal Musculoskelatal Symptoms Reviewed: Weakness Additional Musculoskeletal Details: right arm weakness from elbow to hand Musculoskeletal Self-Management Outcome: 3 (uncertain)      Psychosocial Psychosocial Symptoms Reported: Not assessed         There were no vitals filed for this visit.  Medications Reviewed Today     Reviewed by Aura Leeds, RN (Registered Nurse) on 06/19/23 at 1503  Med List Status: <None>   Medication Order Taking? Sig Documenting Provider Last Dose Status Informant  amLODipine  (NORVASC ) 10 MG tablet [id number] Yes Take 1 tablet (10 mg total) by mouth daily. Marius Siemens, NP  Active Self, Pharmacy Records  aspirin  EC 81 MG tablet [id number] Yes Take 1 tablet (81 mg total) by mouth daily. Swallow whole. Aisha Hove, MD  Active   atorvastatin  (LIPITOR ) 80 MG tablet [id number] Yes Take 1 tablet (80 mg total) by mouth daily. Aisha Hove, MD  Active   clopidogrel  (PLAVIX ) 75 MG tablet [id number] Yes Take 1 tablet (75 mg total) by mouth daily. Aisha Hove, MD  Active   glimepiride  (AMARYL ) 2 MG tablet [id number] Yes Take 1 tablet (2 mg total) by mouth every morning. Aisha Hove, MD  Active   lisinopril -hydrochlorothiazide  (ZESTORETIC ) 10-12.5 MG tablet [id number] Yes Take 1 tablet by mouth daily. Marius Siemens, NP  Active Self, Pharmacy Records  Omega-3 Fatty Acids (ULTRA OMEGA-3 FISH OIL ) 1400 MG CAPS [id number] Yes Take 2,800 mg by mouth daily. [provider]  Active Self, Pharmacy Records            Recommendation:  DME requests:  other  glucometer  Follow Up Plan:   Telephone follow-up in 1 week  Arna Better RN, BSN [place]  [facility] RN Care Manager [phone]

## 2023-06-19 NOTE — Transitions of Care (Post Inpatient/ED Visit) (Signed)
 Stroke Discharge Follow-up   06/19/2023 Name:  Edward Cabrera MRN:  [id number] DOB:  1960-08-11  Subjective: Edward Cabrera is a 63 y.o. year old male who is a primary care patient of Marius Siemens, NP An Emmi alert was received indicating patient responded to questions: Sad, hopeless, anxious, or empty?. I reached out by phone to follow up on the alert and spoke to .  Placed call to patient with no answer. Left a VM requesting a call back. .  Care Management Interventions:unsuccessful attempt 2  Follow up plan: will call back for 3rd attempt in 24 hours. Orpha Blade, RN, BSN, CEN [employer]- Transition of Care Team.  [facility] [phone]

## 2023-06-19 NOTE — Patient Instructions (Signed)
 Visit Information  Thank you for taking time to visit with me today. Please don't hesitate to contact me if I can be of assistance to you before our next scheduled telephone appointment.  Our next appointment is by telephone on 06/27/23 at 2:30pm  Following is a copy of your care plan:   Goals Addressed             This Visit's Progress    [facility] Transitions of Care (TOC) Care Plan       Problems:  Recent Hospitalization for treatment of CVA Knowledge Deficit Related to preventive care  Goal:  Over the next 30 days, the patient will not experience hospital readmission  Interventions:  Transitions of Care: Doctor Visits  - discussed the importance of doctor visits Post discharge activity limitations prescribed by provider reviewed Medication review, discussed the importance of requesting refills 3-4 days prior to running out Reviewed upcoming appointments including:06/20/23 for Neuro Rehab PT/OT evaluation, 06/21/23 with Pharmacist Advised patient to take all medications and recorded BP readings to upcoming appointments Collaborated with PCP to request a glucometer  Stroke: Reviewed Importance of taking all medications as prescribed Reviewed Importance of attending all scheduled provider appointments Advised to report any changes in symptoms or exercise tolerance Screening for signs and symptoms of depression related to chronic disease state Reviewed the importance of exercise Assessed for cognitive impairment Assessed for fall status and safety in the home Reviewed signs and symptoms of stroke  Patient Self Care Activities:  Attend all scheduled provider appointments Call provider office for new concerns or questions  Participate in Transition of Care Program/Attend TOC scheduled calls Take medications as prescribed    Plan:  Telephone follow up appointment with care management team member scheduled for:  06/27/23 at 2:30pm        Patient verbalizes understanding of  instructions and care plan provided today and agrees to view in MyChart. Active MyChart status and patient understanding of how to access instructions and care plan via MyChart confirmed with patient.     Telephone follow up appointment with care management team member scheduled for:06/27/23 at 2:30pm  Please call the care guide team at [phone] if you need to cancel or reschedule your appointment.   Please call 1-800-273-TALK (toll free, 24 hour hotline) go to [facility] Urgent [facility] [address], [place] [phone]) call 911 if you are experiencing a Mental Health or Behavioral Health Crisis or need someone to talk to.  Arna Better RN, BSN [place]  [facility] RN Care Manager [phone]

## 2023-06-20 ENCOUNTER — Encounter: Payer: Self-pay | Admitting: Occupational Therapy

## 2023-06-20 ENCOUNTER — Ambulatory Visit: Attending: Internal Medicine | Admitting: Physical Therapy

## 2023-06-20 ENCOUNTER — Telehealth (INDEPENDENT_AMBULATORY_CARE_PROVIDER_SITE_OTHER): Payer: Self-pay | Admitting: Primary Care

## 2023-06-20 ENCOUNTER — Telehealth: Payer: Self-pay | Admitting: *Deleted

## 2023-06-20 ENCOUNTER — Other Ambulatory Visit (INDEPENDENT_AMBULATORY_CARE_PROVIDER_SITE_OTHER): Payer: Self-pay | Admitting: Primary Care

## 2023-06-20 ENCOUNTER — Encounter: Payer: Self-pay | Admitting: Physical Therapy

## 2023-06-20 ENCOUNTER — Ambulatory Visit: Admitting: Occupational Therapy

## 2023-06-20 VITALS — BP 142/96 | HR 87

## 2023-06-20 DIAGNOSIS — R600 Localized edema: Secondary | ICD-10-CM | POA: Insufficient documentation

## 2023-06-20 DIAGNOSIS — R2681 Unsteadiness on feet: Secondary | ICD-10-CM

## 2023-06-20 DIAGNOSIS — M25511 Pain in right shoulder: Secondary | ICD-10-CM

## 2023-06-20 DIAGNOSIS — R2689 Other abnormalities of gait and mobility: Secondary | ICD-10-CM | POA: Diagnosis present

## 2023-06-20 DIAGNOSIS — I639 Cerebral infarction, unspecified: Secondary | ICD-10-CM | POA: Insufficient documentation

## 2023-06-20 DIAGNOSIS — R208 Other disturbances of skin sensation: Secondary | ICD-10-CM | POA: Insufficient documentation

## 2023-06-20 DIAGNOSIS — I69351 Hemiplegia and hemiparesis following cerebral infarction affecting right dominant side: Secondary | ICD-10-CM | POA: Insufficient documentation

## 2023-06-20 DIAGNOSIS — M6281 Muscle weakness (generalized): Secondary | ICD-10-CM

## 2023-06-20 DIAGNOSIS — R278 Other lack of coordination: Secondary | ICD-10-CM

## 2023-06-20 DIAGNOSIS — I10 Essential (primary) hypertension: Secondary | ICD-10-CM

## 2023-06-20 NOTE — Patient Instructions (Signed)
 Flexion (Assistive)    Clasp hands together (thumb side up) and raise arms above head, keeping elbows as straight as possible. Can be done sitting or lying. Repeat __10__ times. Do __2-3__ sessions per day.   Flexion (Passive)    Sitting upright, slide both forearms forward along table with folded towel underneath (use help from other hand) , bending from the waist until a stretch is felt. Hold _3___ seconds. Repeat _10___ times slowly. Do __2-3__ sessions per day.

## 2023-06-20 NOTE — Therapy (Signed)
 OUTPATIENT PHYSICAL THERAPY NEURO EVALUATION   Patient Name: Edward Cabrera MRN: [id number] DOB:1960/09/26, 63 y.o., male Today's Date: 06/20/2023   PCP: Marius Siemens, NP REFERRING PROVIDER: Aisha Hove, MD  END OF SESSION:  PT End of Session - 06/20/23 0847     Visit Number 1    Authorization Type U[employer]    PT Start Time 0845    PT Stop Time 0914    PT Time Calculation (min) 29 min    Activity Tolerance Patient tolerated treatment well    Behavior During Therapy WFL for tasks assessed/performed          Past Medical History:  Diagnosis Date   Diabetes mellitus without complication (HCC)    Gout    Hypertension    Pulmonary embolism (HCC)    Past Surgical History:  Procedure Laterality Date   ROTATOR CUFF REPAIR Left    Patient Active Problem List   Diagnosis Date Noted   Uncontrolled type 2 diabetes mellitus with hyperglycemia, without long-term current use of insulin  (HCC) 06/07/2023   Essential hypertension 06/07/2023   Hyperlipidemia 06/07/2023   Obesity (BMI 30.0-34.9) 06/07/2023   Acute CVA (cerebrovascular accident) (HCC) 06/05/2023    ONSET DATE: 06/07/2023 (referral)   REFERRING DIAG: I63.9 (ICD-10-CM) - Acute CVA (cerebrovascular accident) (HCC)  THERAPY DIAG:  Other abnormalities of gait and mobility  Rationale for Evaluation and Treatment: Rehabilitation  SUBJECTIVE:                                                                                                                                                                                             SUBJECTIVE STATEMENT: Thomas   Pt presents without AD. States he feels as though he is moving pretty well, is mostly worried about his R arm. 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 States he is having a lot of R shoulder pain, but has had R shoulder pain for several months  because of work. Had a fall at work a few months ago when trying to pull apart garbage cans. Landed on his bottom and was able to get up independently. Pt is driving. Has noted changes of his vision, is planning on making eye appointment.   Pt accompanied by: self  PERTINENT HISTORY: PE, CKD,DM II, HTN.  PAIN:  Are you having pain? Yes: NPRS scale: 10/10 Pain location: R shoulder  Pain description: Achy/throbbing  Aggravating factors: moving it, sleeping Relieving factors: None   PRECAUTIONS: Fall  RED FLAGS: None  WEIGHT BEARING RESTRICTIONS: No  FALLS: Has patient fallen in last 6 months? Yes. Number of falls 1, fell backwards pulling cans apart  LIVING ENVIRONMENT: Lives with: lives with their family Lives in: House/apartment Stairs: Yes: External: 3 steps; on right going up, on left going up, and can reach both Has following equipment at home: Single point cane  PLOF: Independent  PATIENT GOALS: To try to get back 100% of my hand/arm   OBJECTIVE:  Note: Objective measures were completed at Evaluation unless otherwise noted.  DIAGNOSTIC FINDINGS:   MRA of head from 06/2023   IMPRESSION: 1. 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 . 2. 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 High-grade tandem distal left vertebral artery stenosis at the vertebral basilar junction.  MRI of brain from 06/2023   IMPRESSION: 1. 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 2. Poor right intradural vertebral artery flow void. This could be secondary to small/nondominant vessel; however, recommend CTA head/neck to further characterize and exclude occlusion or stenosis.  COGNITION: Overall cognitive status: Within functional limits for tasks assessed   SENSATION: Occasional numbness and pins/needles  sensation in RUE   POSTURE: rounded shoulders, forward head, and flexed trunk   LOWER EXTREMITY ROM:     Active  Right Eval Left Eval  Hip flexion    Hip extension    Hip abduction    Hip adduction    Hip internal rotation    Hip external rotation    Knee flexion    Knee extension    Ankle dorsiflexion    Ankle plantarflexion    Ankle inversion    Ankle eversion     (Blank rows = not tested)  LOWER EXTREMITY MMT:  Tested in seated position   MMT Right Eval Left Eval  Hip flexion 5 5  Hip extension    Hip abduction 5 5  Hip adduction 5 5  Hip internal rotation    Hip external rotation    Knee flexion 5 5  Knee extension 5 5  Ankle dorsiflexion 5 5  Ankle plantarflexion    Ankle inversion    Ankle eversion    (Blank rows = not tested)  BED MOBILITY:  Not tested  TRANSFERS: Sit to stand: Complete Independence  Assistive device utilized: None     Stand to sit: Complete Independence  Assistive device utilized: None      RAMP:  Not tested  CURB:  Not tested  STAIRS: Findings: Level of Assistance: Complete Independence, Stair Negotiation Technique: Alternating Pattern  with No Rails, Number of Stairs: 4, and Height of Stairs: 6    GAIT: Gait pattern: decreased arm swing- Right and lateral lean- Right Distance walked: Various clinic distances  Assistive device utilized: None Level of assistance: Complete Independence Comments: No instability noted    FUNCTIONAL TESTS:  MCTSIB: Condition 1: Avg of 3 trials: 30 sec, Condition 2: Avg of 3 trials: 30 sec (mild lateral sway to L), Condition 3: Avg of 3 trials: 30 sec, Condition 4: Avg of 3 trials: 30 sec, and Total Score: 120/120    [facility] PT Assessment - 06/20/23 0900       Transfers   Five time sit to stand comments  12.9s   no UE support     Balance   Balance Assessed Yes      Standardized Balance Assessment  Standardized Balance Assessment 10 meter walk test    10 Meter Walk 1.3 m/s   54m over  7.5s     Functional Gait  Assessment   Gait assessed  Yes    Gait Level Surface Walks 20 ft in less than 5.5 sec, no assistive devices, good speed, no evidence for imbalance, normal gait pattern, deviates no more than 6 in outside of the 12 in walkway width.   5.16s   Change in Gait Speed Able to smoothly change walking speed without loss of balance or gait deviation. Deviate no more than 6 in outside of the 12 in walkway width.    Gait with Horizontal Head Turns Performs head turns smoothly with no change in gait. Deviates no more than 6 in outside 12 in walkway width    Gait with Vertical Head Turns Performs head turns with no change in gait. Deviates no more than 6 in outside 12 in walkway width.    Gait and Pivot Turn Pivot turns safely within 3 sec and stops quickly with no loss of balance.    Step Over Obstacle Is able to step over 2 stacked shoe boxes taped together (9 in total height) without changing gait speed. No evidence of imbalance.    Gait with Narrow Base of Support Ambulates 7-9 steps.    Gait with Eyes Closed Walks 20 ft, no assistive devices, good speed, no evidence of imbalance, normal gait pattern, deviates no more than 6 in outside 12 in walkway width. Ambulates 20 ft in less than 7 sec.   6.02s   Ambulating Backwards Walks 20 ft, no assistive devices, good speed, no evidence for imbalance, normal gait   9.06s   Steps Alternating feet, no rail.    Total Score 29    FGA comment: Low fall risk           VITALS  Vitals:   06/20/23 0848  BP: (!) 142/96  Pulse: 87                                                                                                                                 TREATMENT:   Recommended pt make ophthalmologist appointment soon as he reports blurry vision. No field cuts noted    PATIENT EDUCATION: Education details: Eval findings, making eye appointment Person educated: Patient Education method: Explanation Education comprehension:  verbalized understanding  GOALS: N/A     ASSESSMENT:  CLINICAL IMPRESSION: Patient is a 63 year old male referred to Neuro OPPT for CVA.   Pt's PMH is significant for: PE, CKD,DM II, HTN. The following deficits were present during the exam: impaired functional use of RUE, impaired functional strength of RUE and pain. Based on gait speed, FGA and MCTSIB, pt is a low fall risk and does not require PT services at this time. Pt would like to focus on OT and addressing RUE weakness. Did recommend pt make ophthalmologist appointment as  he is experiencing blurry vision, but no field cuts or inattention noted.   CLINICAL DECISION MAKING: Stable/uncomplicated  EVALUATION COMPLEXITY: Low  PLAN:  PT FREQUENCY: one time visit   Ava Deguire E Cadel Stairs, PT, DPT 06/20/2023, 9:15 AM

## 2023-06-20 NOTE — Progress Notes (Signed)
   S:     No chief complaint on file.  62 y.o. male who presents for hypertension evaluation, education, and management.   Patient was referred and last seen by Primary Care Provider, Madelyn Schick, on 05/21/2023. At that visit, BP was uncontrolled at 171/114 with repeat reading down to 148/98. He was given a dose of clonidine  0.2 mg in the office. Unfortunately, he presented to his orthopedics office on 06/04/23 with new R arm weakness, concerning for CVA. Per discharge summary, he had been having symptoms for about a week and stated that he had been out of his BP medications for about a month but restarted them a week before presentation. MRI showed evidence of stroke. Amlodipine  and losartan-hydrochlorothiazide  was continued at discharge.   PMH is significant for T2DM, gout, HTN, PE and CVA.   Today, patient arrives in *** spirits and presents without *** assistance. *** Denies dizziness, headache, blurred vision, swelling.   Patient reports hypertension was diagnosed in ***.   Family/Social history: ***  Medication adherence *** . Patient has *** taken BP medications today.   Current antihypertensives include: amlodipine  10 mg daily, lisinopril -hydrochlorothiazide  10-12.5 mg daily  Antihypertensives tried in the past include: ***  Reported home BP readings: ***  Patient reported dietary habits: Eats *** meals/day Breakfast: *** Lunch: *** Dinner: *** Snacks: *** Drinks: ***  Patient-reported exercise habits: ***  O:  Last 3 Office BP readings: BP Readings from Last 3 Encounters:  06/20/23 (!) 142/96  06/19/23 (!) 153/93  06/13/23 (!) 164/117    BMET    Component Value Date/Time   NA 137 06/06/2023 0624   NA 140 05/21/2023 1452   K 3.4 (L) 06/06/2023 0624   CL 101 06/06/2023 0624   CO2 28 06/06/2023 0624   GLUCOSE 207 (H) 06/06/2023 0624   BUN 12 06/06/2023 0624   BUN 13 05/21/2023 1452   CREATININE 1.43 (H) 06/06/2023 0624   CALCIUM  9.5 06/06/2023 0624    GFRNONAA 56 (L) 06/06/2023 0624   GFRAA 73 04/21/2019 1623    Renal function: Estimated Creatinine Clearance: 62.2 mL/min (A) (by C-G formula based on SCr of 1.43 mg/dL (H)).  Clinical ASCVD: Yes  The ASCVD Risk score (Arnett DK, et al., 2019) failed to calculate for the following reasons:   Risk score cannot be calculated because patient has a medical history suggesting prior/existing ASCVD   A/P: Hypertension longstanding currently *** on current medications. BP goal < 130/80 *** mmHg. Medication adherence appears ***. Control is suboptimal due to ***.  -Continue amlodipine  10 mg daily -Increase lisinopril -hydrochlorothiazide  to 20-12.5 mg daily*** -Patient educated on purpose, proper use, and potential adverse effects of ***.  -F/u labs ordered - *** -Counseled on lifestyle modifications for blood pressure control including reduced dietary sodium, increased exercise, adequate sleep. -Encouraged patient to check BP at home and bring log of readings to next visit. Counseled on proper use of home BP cuff.   Results reviewed and written information provided.    Written patient instructions provided. Patient verbalized understanding of treatment plan.  Total time in face to face counseling *** minutes.    Follow-up:  Pharmacist ***. PCP clinic visit not scheduled  Juleen Oakland, PharmD PGY1 Pharmacy Resident

## 2023-06-20 NOTE — Progress Notes (Signed)
 Complex Care Management Note  Care Guide Note 06/20/2023 Name: Edward Cabrera MRN: [id number] DOB: 03-30-1960  Edward Cabrera is a 63 y.o. year old male who sees Marius Siemens, NP for primary care. I reached out to Edward Cabrera by phone today to offer complex care management services.  [name] was given information about Complex Care Management services today including:   The Complex Care Management services include support from the care team which includes your Nurse Care Manager, Clinical Social Worker, or Pharmacist.  The Complex Care Management team is here to help remove barriers to the health concerns and goals most important to you. Complex Care Management services are voluntary, and the patient may decline or stop services at any time by request to their care team member.   Complex Care Management Consent Status: Patient agreed to services and verbal consent obtained.   Follow up plan:  Telephone appointment with complex care management team member scheduled for:  6/24  Encounter Outcome:  Patient Scheduled  Barnie Bora  [facility]  [facility], [facility] Guide  Direct Dial: [phone]  Fax [phone]

## 2023-06-20 NOTE — Telephone Encounter (Signed)
 Done

## 2023-06-20 NOTE — Telephone Encounter (Unsigned)
 Copied from CRM [id number]. Topic: Clinical - Medication Question >> Jun 20, 2023 10:03 AM Precious C wrote: Reason for CRM: Pt called in stating that he need an higher dosage for blood pressure medications:   amLODipine  (NORVASC ) 10 MG tablet lisinopril -hydrochlorothiazide  (ZESTORETIC ) 10-12.5 MG tablet  Has been taking medications for a while(about 15 years) but dosage doesn't seem to be working anymore because he has been taking meds but pressure is still high.

## 2023-06-21 ENCOUNTER — Encounter: Payer: Self-pay | Admitting: Pharmacist

## 2023-06-21 ENCOUNTER — Telehealth: Payer: Self-pay | Admitting: Pharmacist

## 2023-06-21 ENCOUNTER — Ambulatory Visit: Attending: Family Medicine | Admitting: Pharmacist

## 2023-06-21 VITALS — BP 106/74 | HR 102

## 2023-06-21 DIAGNOSIS — I10 Essential (primary) hypertension: Secondary | ICD-10-CM

## 2023-06-21 MED ORDER — LISINOPRIL-HYDROCHLOROTHIAZIDE 10-12.5 MG PO TABS: 1.0000 | ORAL_TABLET | Freq: Every day | ORAL | 1 refills | Status: DC

## 2023-06-21 MED ORDER — VALSARTAN-HYDROCHLOROTHIAZIDE 160-25 MG PO TABS: 1.0000 | ORAL_TABLET | Freq: Every day | ORAL | 1 refills | Status: DC

## 2023-06-21 NOTE — Telephone Encounter (Signed)
 Opened in error

## 2023-06-22 ENCOUNTER — Telehealth: Payer: Self-pay

## 2023-06-22 ENCOUNTER — Telehealth: Payer: Self-pay | Admitting: Pharmacist

## 2023-06-22 DIAGNOSIS — I639 Cerebral infarction, unspecified: Secondary | ICD-10-CM

## 2023-06-22 LAB — CMP14+EGFR
ALT: 28 IU/L (ref 0–44)
AST: 23 IU/L (ref 0–40)
Albumin: 4.6 g/dL (ref 3.8–4.9)
Alkaline Phosphatase: 97 IU/L (ref 44–121)
BUN/Creatinine Ratio: 10 (ref 10–24)
BUN: 20 mg/dL (ref 8–27)
Bilirubin Total: 0.4 mg/dL (ref 0.0–1.2)
CO2: 22 mmol/L (ref 20–29)
Calcium: 10.1 mg/dL (ref 8.6–10.2)
Chloride: 102 mmol/L (ref 96–106)
Creatinine, Ser: 2.1 mg/dL — ABNORMAL HIGH (ref 0.76–1.27)
Globulin, Total: 2.5 g/dL (ref 1.5–4.5)
Glucose: 124 mg/dL — ABNORMAL HIGH (ref 70–99)
Potassium: 4.1 mmol/L (ref 3.5–5.2)
Sodium: 142 mmol/L (ref 134–144)
Total Protein: 7.1 g/dL (ref 6.0–8.5)
eGFR: 35 mL/min/{1.73_m2} — ABNORMAL LOW (ref >59)

## 2023-06-22 NOTE — Telephone Encounter (Signed)
 Call placed to patient to review results. His BP was significantly lower in clinic 06/21/23 than what it has been. We assumed dehydration may be contributing.   Labs support this. Even though BUN nl, Scr shows increase from his baseline to 2.1. I called and reviewed this with him. It looks like his baseline is 1.4-1.7. Increase from 1.7 us  ~25%. Therefore, we can keep the RAAS agent for now. I advised him to push fluids over the weekend. He needs to consume at least 48-64 oz of water over the course of the day. He admits he has not been doing this. I have entered repeat labs for him to complete Thursday at [facility].

## 2023-06-22 NOTE — Transitions of Care (Post Inpatient/ED Visit) (Signed)
 Stroke Discharge Follow-up   06/22/2023 Name:  Edward Cabrera MRN:  [id number] DOB:  [birth year]  Subjective: Edward Cabrera is a 63 y.o. year old male who is a primary care patient of Marius Siemens, NP An Emmi alert was received indicating patient responded to questions: Questions/problems with meds? Went to follow-up appointment?. I reached out by phone to follow up on the alert and spoke to no answer on call attempt.  Care Management Interventions:attempted to reach patient.   Follow up plan: will attempt second call in 24 business hours. Orpha Blade, RN, BSN, CEN [employer]- Transition of Care Team.  [facility] [phone]

## 2023-06-22 NOTE — Telephone Encounter (Signed)
 Noted

## 2023-06-22 NOTE — Telephone Encounter (Signed)
 Pt seen [facility] 06/21/23

## 2023-06-25 ENCOUNTER — Telehealth: Payer: Self-pay

## 2023-06-25 ENCOUNTER — Other Ambulatory Visit (INDEPENDENT_AMBULATORY_CARE_PROVIDER_SITE_OTHER): Payer: Self-pay | Admitting: Primary Care

## 2023-06-25 ENCOUNTER — Ambulatory Visit (INDEPENDENT_AMBULATORY_CARE_PROVIDER_SITE_OTHER): Payer: Self-pay | Admitting: Primary Care

## 2023-06-25 DIAGNOSIS — N183 Chronic kidney disease, stage 3 unspecified: Secondary | ICD-10-CM

## 2023-06-25 NOTE — Transitions of Care (Post Inpatient/ED Visit) (Signed)
 Stroke Discharge Follow-up   06/25/2023 Name:  Edward Cabrera MRN:  [id number] DOB:  [birth year]  Subjective: Edward Cabrera is a 63 y.o. year old male who is a primary care patient of Celestia Rosaline SQUIBB, NP An Emmi alert was received indicating patient responded to questions: Questions/problems with meds? Went to follow-up appointment?. I reached out by phone to follow up on the alert and spoke to Patient. Patient reports responses were in error.  Denies any problems or concerns today.   Care Management Interventions: Patient confirms that he has all his medications and is taking them as prescribed.  Reports that he met with pharmacist.  Also, patient reports that he has been to his follow-up.   Follow up plan: No further intervention required.   Alan Ee, RN, BSN, CEN [employer]- Transition of Care Team.  [facility] [phone]

## 2023-06-25 NOTE — Transitions of Care (Post Inpatient/ED Visit) (Signed)
 Stroke Discharge Follow-up   06/25/2023 Name:  Edward Cabrera MRN:  [id number] DOB:  01/12/1960  Subjective: Edward Cabrera is a 63 y.o. year old male who is a primary care patient of Celestia Rosaline SQUIBB, NP An Emmi alert was received indicating patient responded to questions: Questions/problems with meds? Went to follow-up appointment?. I reached out by phone to follow up on the alert and spoke to no one. Left a VM for patient to call me back..  Care Management Interventions: call attempt 2  Follow up plan: will call back in 24 hours. Alan Ee, RN, BSN, CEN [employer]- Transition of Care Team.  [facility] [phone]

## 2023-06-26 ENCOUNTER — Other Ambulatory Visit: Payer: Self-pay

## 2023-06-26 NOTE — Patient Outreach (Signed)
 LCSW called patient at scheduled time to discuss the reason for LCSW involvement to case for depression.LCSW discussed with patient some possible options that can be utilized for depression. Patient explained that he does not feel that he has depression however more frustration due to not being able to do the things that he was able to do before his stroke. Patient declined involvement of LCSW to case and possible interventions. LCSW asked patient if LCSW could follow up in 6 weeks with patient to see he is doing. Patient was in agreement and visit set for 8/52025 at 9:00am.  Olam Ally, MSW, LCSW [place]  [facility], [facility] Licensed Clinical Social Worker Direct Dial: [phone]

## 2023-06-26 NOTE — Patient Instructions (Signed)
 Visit Information  Thank you for taking time to visit with me today. Please don't hesitate to contact me if I can be of assistance to you before our next scheduled appointment.  Our next appointment is by telephone on 08/07/2023 at 9:00 am Please call the care guide team at [phone] if you need to cancel or reschedule your appointment.   Patient has agreed for LCSW to do a follow up to ensure that he does not want involvement of LCSW for mental health concerns.  Following is a copy of your care plan:   Goals Addressed   None     Please call the Suicide and Crisis Lifeline: 988 if you are experiencing a Mental Health or Behavioral Health Crisis or need someone to talk to.  Patient verbalizes understanding of instructions and care plan provided today and agrees to view in MyChart. Active MyChart status and patient understanding of how to access instructions and care plan via MyChart confirmed with patient.     Olam Ally, MSW, LCSW [place]  [facility], [facility] Licensed Clinical Social Worker Direct Dial: [phone]

## 2023-06-27 ENCOUNTER — Other Ambulatory Visit: Payer: Self-pay | Admitting: *Deleted

## 2023-06-27 NOTE — Patient Instructions (Signed)
 Visit Information  Thank you for taking time to visit with me today. Please don't hesitate to contact me if I can be of assistance to you before our next scheduled telephone appointment.   Following is a copy of your care plan:   Goals Addressed             This Visit's Progress    VBCI Transitions of Care (TOC) Care Plan       Problems:  Recent Hospitalization for treatment of CVA Knowledge Deficit Related to preventive care  Goal:  Over the next 30 days, the patient will not experience hospital readmission  Interventions:  Transitions of Care: Doctor Visits  - discussed the importance of doctor visits Post discharge activity limitations prescribed by provider reviewed Medication review, discussed the importance of requesting refills 3-4 days prior to running out Reviewed upcoming appointments including:07/09/23 for Neuro Rehab OT  Advised patient to take all medications and recorded BP readings to upcoming appointments Collaborated with PCP to request a glucometer-advised contact pharmacy  Stroke: Reviewed Importance of taking all medications as prescribed Reviewed Importance of attending all scheduled provider appointments Advised to report any changes in symptoms or exercise tolerance Screening for signs and symptoms of depression related to chronic disease state Reviewed the importance of exercise Assessed for cognitive impairment Assessed for fall status and safety in the home Reviewed signs and symptoms of stroke  Patient Self Care Activities:  Attend all scheduled provider appointments Call provider office for new concerns or questions  Participate in Transition of Care Program/Attend TOC scheduled calls Take medications as prescribed    Plan:  Telephone follow up appointment with care management team member scheduled for:  07/04/23 at 3:45pm        Patient verbalizes understanding of instructions and care plan provided today and agrees to view in MyChart.  Active MyChart status and patient understanding of how to access instructions and care plan via MyChart confirmed with patient.     Telephone follow up appointment with care management team member scheduled for:07/04/23 at 3:45pm  Please call the care guide team at [phone] if you need to cancel or reschedule your appointment.   Please call 1-800-273-TALK (toll free, 24 hour hotline) go to [facility] [address], [place] [phone]) call 911 if you are experiencing a Mental Health or Behavioral Health Crisis or need someone to talk to.  Andrea Dimes RN, BSN [place]  Value-Based Care Institute [facility] RN Care Manager [phone]

## 2023-06-27 NOTE — Transitions of Care (Post Inpatient/ED Visit) (Signed)
  Transition of Care week 3  Visit Note  06/27/2023  Name: Edward Cabrera MRN: [id number]          DOB: 04-13-1960  Situation: Patient enrolled in [facility] 30-day program. Visit completed with Edward Cabrera by telephone.   Background:   Initial Transition Care Management Follow-up Telephone Call    Past Medical History:  Diagnosis Date   Diabetes mellitus without complication (HCC)    Gout    Hypertension    Pulmonary embolism (HCC)     Assessment: Patient Reported Symptoms: Cognitive Cognitive Status: Able to follow simple commands, Normal speech and language skills, Alert and oriented to person, place, and time, Insightful and able to interpret abstract concepts      Neurological Neurological Review of Symptoms: Weakness Neurological Comment: continues to have right arm weakness. Will start PT on 07/09/23  HEENT HEENT Symptoms Reported: No symptoms reported      Cardiovascular Cardiovascular Symptoms Reported: No symptoms reported Is patient checking Blood Pressure at home?: No Cardiovascular Self-Management Outcome: 4 (good) Cardiovascular Comment: Patient had 3 good BP readings on 06/21/23  Respiratory Respiratory Symptoms Reported: No symptoms reported    Endocrine Patient reports the following symptoms related to hypoglycemia or hyperglycemia : No symptoms reported Is patient diabetic?: Yes Is patient checking blood sugars at home?: No Endocrine Self-Management Outcome: 3 (uncertain) Endocrine Comment: Advised patient to check at the pharmacy for glucometer ordered by PCP  Gastrointestinal Gastrointestinal Symptoms Reported: No symptoms reported      Genitourinary Genitourinary Symptoms Reported: No symptoms reported    Integumentary Integumentary Symptoms Reported: No symptoms reported    Musculoskeletal Musculoskelatal Symptoms Reviewed: Weakness Additional Musculoskeletal Details: right arm weakness from elbow to hand Musculoskeletal Self-Management Outcome: 3  (uncertain) Musculoskeletal Comment: Scheduled to start OT on 07/09/23      Psychosocial Psychosocial Symptoms Reported: No symptoms reported         There were no vitals filed for this visit.  Medications Reviewed Today   Medications were not reviewed in this encounter     Recommendation:   Continue Current Plan of Care  Follow Up Plan:   Telephone follow-up in 1 week  Andrea Dimes RN, BSN [place]  Value-Based Care Institute [facility] RN Care Manager [phone]

## 2023-06-28 ENCOUNTER — Telehealth: Payer: Self-pay

## 2023-06-28 ENCOUNTER — Other Ambulatory Visit: Payer: Self-pay | Admitting: Pharmacist

## 2023-06-28 DIAGNOSIS — I10 Essential (primary) hypertension: Secondary | ICD-10-CM

## 2023-06-28 NOTE — Telephone Encounter (Signed)
 Copied from CRM [id number]. Topic: Clinical - Request for Lab/Test Order >> Jun 27, 2023  1:41 PM Essie A wrote: Reason for CRM: Patient says that Edward Cabrera was supposed to put an order in for labs.  There is no order in his chart.  Please let him know when the order has been placed so he can go to the lab.  Call him at [phone]

## 2023-06-28 NOTE — Telephone Encounter (Signed)
 Future orders placed. Patient plans to come tomorrow to [facility] for labs.

## 2023-06-29 ENCOUNTER — Ambulatory Visit (INDEPENDENT_AMBULATORY_CARE_PROVIDER_SITE_OTHER)

## 2023-06-29 DIAGNOSIS — I10 Essential (primary) hypertension: Secondary | ICD-10-CM

## 2023-06-30 LAB — CMP14+EGFR
ALT: 30 IU/L (ref 0–44)
AST: 20 IU/L (ref 0–40)
Albumin: 4.3 g/dL (ref 3.8–4.9)
Alkaline Phosphatase: 87 IU/L (ref 44–121)
BUN/Creatinine Ratio: 10 (ref 10–24)
BUN: 15 mg/dL (ref 8–27)
Bilirubin Total: 0.3 mg/dL (ref 0.0–1.2)
CO2: 23 mmol/L (ref 20–29)
Calcium: 10 mg/dL (ref 8.6–10.2)
Chloride: 102 mmol/L (ref 96–106)
Creatinine, Ser: 1.45 mg/dL — ABNORMAL HIGH (ref 0.76–1.27)
Globulin, Total: 2.6 g/dL (ref 1.5–4.5)
Glucose: 138 mg/dL — ABNORMAL HIGH (ref 70–99)
Potassium: 4.4 mmol/L (ref 3.5–5.2)
Sodium: 140 mmol/L (ref 134–144)
Total Protein: 6.9 g/dL (ref 6.0–8.5)
eGFR: 55 mL/min/{1.73_m2} — ABNORMAL LOW (ref >59)

## 2023-07-02 NOTE — Telephone Encounter (Signed)
 TY

## 2023-07-03 ENCOUNTER — Ambulatory Visit (INDEPENDENT_AMBULATORY_CARE_PROVIDER_SITE_OTHER): Payer: Self-pay | Admitting: Primary Care

## 2023-07-04 ENCOUNTER — Encounter: Payer: Self-pay | Admitting: *Deleted

## 2023-07-04 ENCOUNTER — Telehealth: Payer: Self-pay | Admitting: *Deleted

## 2023-07-05 ENCOUNTER — Other Ambulatory Visit (HOSPITAL_COMMUNITY): Payer: Self-pay

## 2023-07-05 ENCOUNTER — Telehealth: Payer: Self-pay | Admitting: *Deleted

## 2023-07-05 NOTE — Transitions of Care (Post Inpatient/ED Visit) (Signed)
 Transition of Care week 4  Visit Note  07/05/2023  Name: Edward Cabrera MRN: [id number]          DOB: 07-Apr-1960  Situation: Patient enrolled in [facility] 30-day program. Visit completed with [name] by telephone.   Background:   Initial Transition Care Management Follow-up Telephone Call    Past Medical History:  Diagnosis Date   Diabetes mellitus without complication (HCC)    Gout    Hypertension    Pulmonary embolism (HCC)     Assessment: Patient Reported Symptoms: Cognitive Cognitive Status: No symptoms reported      Neurological Neurological Review of Symptoms: Weakness, Numbness Neurological Management Strategies: Medication therapy, Exercise, Routine screening Neurological Self-Management Outcome: 3 (uncertain) Neurological Comment: Increased strength, continues to have swelling and numbness in right hand. Starts OT on 07/09/23  HEENT HEENT Symptoms Reported: No symptoms reported      Cardiovascular Cardiovascular Symptoms Reported: No symptoms reported    Respiratory Respiratory Symptoms Reported: No symptoms reported    Endocrine Endocrine Symptoms Reported: No symptoms reported Is patient diabetic?: Yes Is patient checking blood sugars at home?: No (Will follow up with pharmacy regarding oredered glucometer) Endocrine Self-Management Outcome: 3 (uncertain)  Gastrointestinal Gastrointestinal Symptoms Reported: No symptoms reported      Genitourinary Genitourinary Symptoms Reported: No symptoms reported    Integumentary Integumentary Symptoms Reported: No symptoms reported    Musculoskeletal Musculoskelatal Symptoms Reviewed: Weakness Additional Musculoskeletal Details: right arm strength improving Musculoskeletal Comment: scheduled to start OT on 07/09/23      Psychosocial Psychosocial Symptoms Reported: Not assessed         There were no vitals filed for this visit.  Medications Reviewed Today     Reviewed by Lucky Andrea LABOR, RN (Registered Nurse) on  07/05/23 at 1159  Med List Status: <None>   Medication Order Taking? Sig Documenting Provider Last Dose Status Informant  amLODipine  (NORVASC ) 10 MG tablet [id number] Yes Take 1 tablet (10 mg total) by mouth daily. Celestia Rosaline SQUIBB, NP  Active Self, Pharmacy Records  aspirin  EC 81 MG tablet [id number] Yes Take 1 tablet (81 mg total) by mouth daily. Swallow whole. Darci Pore, MD  Active   atorvastatin  (LIPITOR ) 80 MG tablet [id number] Yes Take 1 tablet (80 mg total) by mouth daily. Darci Pore, MD  Active   Blood Glucose Monitoring Suppl DEVI [id number]  1 each by Does not apply route in the morning, at noon, and at bedtime. May substitute to any manufacturer covered by patient's insurance.  Patient not taking: Reported on 07/05/2023   Celestia Rosaline SQUIBB, NP  Active   clopidogrel  (PLAVIX ) 75 MG tablet [id number] Yes Take 1 tablet (75 mg total) by mouth daily. Darci Pore, MD  Active   glimepiride  (AMARYL ) 2 MG tablet [id number] Yes Take 1 tablet (2 mg total) by mouth every morning. Darci Pore, MD  Active   Glucose Blood (BLOOD GLUCOSE TEST STRIPS) STRP [id number]  1 each by In Vitro route in the morning, at noon, and at bedtime. May substitute to any manufacturer covered by patient's insurance.  Patient not taking: Reported on 07/05/2023   Celestia Rosaline SQUIBB, NP  Active   Lancet Device MISC [id number]  1 each by Does not apply route in the morning, at noon, and at bedtime. May substitute to any manufacturer covered by patient's insurance.  Patient not taking: Reported on 07/05/2023   Celestia Rosaline SQUIBB, NP  Active   Lancets Misc. MISC [id number]  1 each by Does not apply  route in the morning, at noon, and at bedtime. May substitute to any manufacturer covered by patient's insurance.  Patient not taking: Reported on 07/05/2023   Celestia Rosaline SQUIBB, NP  Active   lisinopril -hydrochlorothiazide  (ZESTORETIC ) 10-12.5 MG tablet [id number] Yes Take 1 tablet by mouth daily.  Newlin, Enobong, MD  Active   Omega-3 Fatty Acids (ULTRA OMEGA-3 FISH OIL ) 1400 MG CAPS [id number] Yes Take 2,800 mg by mouth daily. [provider]  Active Self, Pharmacy Records            Recommendation:   Continue Current Plan of Care  Follow Up Plan:   Telephone follow-up in 1 week  Andrea Dimes RN, BSN [place]  Value-Based Care Institute [facility] RN Care Manager [phone]

## 2023-07-05 NOTE — Patient Instructions (Signed)
 Visit Information  Thank you for taking time to visit with me today. Please don't hesitate to contact me if I can be of assistance to you before our next scheduled telephone appointment.   Following is a copy of your care plan:   Goals Addressed             This Visit's Progress    VBCI Transitions of Care (TOC) Care Plan       Problems:  Recent Hospitalization for treatment of CVA Knowledge Deficit Related to preventive care  Goal:  Over the next 30 days, the patient will not experience hospital readmission  Interventions:  Transitions of Care: Doctor Visits  - discussed the importance of doctor visits Post discharge activity limitations prescribed by provider reviewed Medication review, discussed the importance of requesting refills 3-4 days prior to running out Reviewed upcoming appointments including:07/09/23 for Neuro Rehab OT  Advised patient to take all medications and recorded BP readings to upcoming appointments Collaborated with PCP to request a glucometer-advised contact pharmacy-revisited  Stroke: Reviewed Importance of taking all medications as prescribed Reviewed Importance of attending all scheduled provider appointments Advised to report any changes in symptoms or exercise tolerance Screening for signs and symptoms of depression related to chronic disease state Reviewed the importance of exercise Assessed for cognitive impairment Assessed for fall status and safety in the home Reviewed signs and symptoms of stroke  Patient Self Care Activities:  Attend all scheduled provider appointments Call provider office for new concerns or questions  Participate in Transition of Care Program/Attend TOC scheduled calls Take medications as prescribed    Plan:  Telephone follow up appointment with care management team member scheduled for:  07/04/23 at 3:45pm        Patient verbalizes understanding of instructions and care plan provided today and agrees to view in  MyChart. Active MyChart status and patient understanding of how to access instructions and care plan via MyChart confirmed with patient.     Telephone follow up appointment with care management team member scheduled for:07/11/23 at 3:30pm  Please call the care guide team at [phone] if you need to cancel or reschedule your appointment.   Please call 1-800-273-TALK (toll free, 24 hour hotline) go to [facility] [address] [place] [phone]) call 911 if you are experiencing a Mental Health or Behavioral Health Crisis or need someone to talk to.  Andrea Dimes RN, BSN [place]  Value-Based Care Institute [facility] RN Care Manager [phone]

## 2023-07-09 ENCOUNTER — Ambulatory Visit: Attending: Internal Medicine | Admitting: Occupational Therapy

## 2023-07-09 ENCOUNTER — Encounter: Payer: Self-pay | Admitting: Occupational Therapy

## 2023-07-09 DIAGNOSIS — M6281 Muscle weakness (generalized): Secondary | ICD-10-CM | POA: Diagnosis present

## 2023-07-09 DIAGNOSIS — R2681 Unsteadiness on feet: Secondary | ICD-10-CM | POA: Insufficient documentation

## 2023-07-09 DIAGNOSIS — R208 Other disturbances of skin sensation: Secondary | ICD-10-CM | POA: Insufficient documentation

## 2023-07-09 DIAGNOSIS — M25511 Pain in right shoulder: Secondary | ICD-10-CM | POA: Diagnosis present

## 2023-07-09 DIAGNOSIS — I69351 Hemiplegia and hemiparesis following cerebral infarction affecting right dominant side: Secondary | ICD-10-CM | POA: Diagnosis present

## 2023-07-09 DIAGNOSIS — R600 Localized edema: Secondary | ICD-10-CM | POA: Diagnosis present

## 2023-07-09 DIAGNOSIS — R278 Other lack of coordination: Secondary | ICD-10-CM | POA: Insufficient documentation

## 2023-07-09 NOTE — Patient Instructions (Signed)
  CONTINUE WITH PREVIOUS SHOULDER EXERCISES  Supination (Active)    With elbow held at right angle and kept at side, turn palm upward as far as possible. Hold __5__ seconds. Repeat _10___ times. Do __2__ sessions per day. Activity: Use this motion when turning cards over.*   AROM: Wrist Extension    With right palm down, bend wrist up. Repeat __10__ times per set. Do __2__ sessions per day.   AROM: Finger Flexion / Extension    Actively bend fingers of right hand into fist. Start with knuckles furthest from palm, and slowly make a fist. Hold __5__ seconds. Relax. Then straighten fingers as far as possible. Repeat __10__ times per set. Do __2__ sessions per day.    Use Rt hand to do functional LIGHT AND SAFE tasks (use below for examples):   1 - Opening lower cabinets and sliding drawers  2 - Pick up cup and bring to mouth Rt hand, then place flat on table and fully release  3 - Stack cups and unstack cups  4 - Pick up checkers and put in cup/bowl  5 - Fold towels   6 - Use Rt hand to help bath and dress  7 - Use Rt hand to feed yourself finger foods  8 - Use built up spoon/fork and practice bringing spoon to mouth in prep for food  9 - Flip large cards over on table one at a time  10 - Try and hold remote in Rt hand and push buttons with Lt hand

## 2023-07-09 NOTE — Therapy (Signed)
 OUTPATIENT OCCUPATIONAL THERAPY NEURO TREATMENT  Patient Name: Edward Cabrera MRN: [id number] DOB:[birth year], 63 y.o., male Today's Date: 07/09/2023  PCP: Celestia Rosaline SQUIBB, NP  REFERRING PROVIDER: Darci Pore, MD  END OF SESSION:  OT End of Session - 07/09/23 1023     Visit Number 2    Number of Visits 24    Date for OT Re-Evaluation 09/18/23    Authorization Type U[employer], VL: 60 PT/OT/ST    OT Start Time 1020    OT Stop Time 1100    OT Time Calculation (min) 40 min    Activity Tolerance Patient tolerated treatment well    Behavior During Therapy WFL for tasks assessed/performed          Past Medical History:  Diagnosis Date   Diabetes mellitus without complication (HCC)    Gout    Hypertension    Pulmonary embolism (HCC)    Past Surgical History:  Procedure Laterality Date   ROTATOR CUFF REPAIR Left    Patient Active Problem List   Diagnosis Date Noted   Uncontrolled type 2 diabetes mellitus with hyperglycemia, without long-term current use of insulin  (HCC) 06/07/2023   Essential hypertension 06/07/2023   Hyperlipidemia 06/07/2023   Obesity (BMI 30.0-34.9) 06/07/2023   Acute CVA (cerebrovascular accident) (HCC) 06/05/2023    ONSET DATE: 06/07/2023 (referral date)   REFERRING DIAG: I63.9 (ICD-10-CM) - Acute CVA (cerebrovascular accident) (HCC)  THERAPY DIAG:  Hemiplegia and hemiparesis following cerebral infarction affecting right dominant side (HCC)  Other lack of coordination  Muscle weakness (generalized)  Unsteadiness on feet  Other disturbances of skin sensation  Acute pain of right shoulder  Rationale for Evaluation and Treatment: Rehabilitation  SUBJECTIVE:   SUBJECTIVE STATEMENT: Pain Rt shoulder up to 8/10 with higher movement. I have swelling in my Rt hand Pt accompanied by: self  PERTINENT HISTORY: admitted to [facility] on 06/05/23 with 1 week hx of worsening RUE weakness, while in ED pt developed R facial droop and slurry speech. MRI   demonstrated L frontal and L occipital infarcts. PMH of PE, CKD,DM II, HTN.   PRECAUTIONS: Other: heart monitor and no heavy lifting  WEIGHT BEARING RESTRICTIONS: No  PAIN:  Are you having pain? Rt shoulder 3-8/10 - worse when lifting arm  FALLS: Has patient fallen in last 6 months? Yes. Number of falls 1  LIVING ENVIRONMENT: Lives with: lives with their family Lives in: 1 story house with 3 steps to enter Has following equipment at home: Single point cane  PLOF: Independent, working full time as Mining engineer - requires lifting/pulling, but also typing  PATIENT GOALS: use of dominant arm and hand for self care and work  OBJECTIVE:  Note: Objective measures were completed at Evaluation unless otherwise noted.  HAND DOMINANCE: Right  ADLs: Overall ADLs: mod I to min assist Transfers/ambulation related to ADLs: independent Eating: eating with Lt non dominant hand with difficulty Grooming: with Lt non dominant hand with difficulty UB Dressing: mod I with difficulty - pull over shirts only LB Dressing: assist w/ Rt shoe and sock, wearing elastic pants b/c he can't do buttons Toileting: mod I  Bathing: mod I w/ LH sponge Tub Shower transfers: mod I  Equipment: Long handled sponge and hand held shower  IADLs: Shopping: mod I  Light housekeeping: unable to fold towels or anything that requires 2 hands Meal Prep: dependent currently (pt loves to cook and did mostly prior to CVA)  Community mobility: pt stills driving Medication management: independent Handwriting: unable Rt  dominant hand, practicing w/ Lt hand  MOBILITY STATUS: Independent   FUNCTIONAL OUTCOME MEASURES: Upper Extremity Functional Scale (UEFS): 2/80 = .025 which translates to 99.075% disability RT dominant UE  UPPER EXTREMITY ROM:  minimal shoulder movement dominated by synergy pattern and no distal control. Approx 40% elbow flex, no supination, minimal trace wrist extension and 10% finger  flex/ext   UPPER EXTREMITY MMT:   NOT TESTED D/T LIMITED MOVEMENT   HAND FUNCTION: No grip strength  COORDINATION: Pt can pick up block w/ difficulty if block is stabilized but cannot maintain  SENSATION: Light touch/localization intact, but still has numbness/tingling  EDEMA: min to mod Rt hand  MUSCLE TONE: RUE: Hypotonic  COGNITION: Overall cognitive status: Within functional limits for tasks assessed  VISION: Subjective report: At first my vision was blurry and I was seeing spots, but it's getting better. Denies diplopia Baseline vision: No visual deficits Visual history: none  VISION ASSESSMENT: Will further assess in functional context     PERCEPTION: Not tested  PRAXIS: Not tested  OBSERVATIONS: Pt with shoulder pain and movement emerging RUE although limited and dominated by synergy pattern                                                                                                                             TREATMENT DATE: 07/09/23  BP = 145/89  Reviewed previously issued sh HEP and stressed the importance of quality of movement and correct movement pattern. Emphasized posture with table slides as pt rolls shoulders forward.  Pt issued additional HEP for distal A/ROM (forearm, wrist, and hand) and functional therapeutic activities/ tasks to perform with RUE/hand - see pt instructions for details. Pt performed each ex and activity at least 10 times. Emphasized importance of proper technique/movement pattern especially with ex's, and importance of repetition with functional tasks.   UBE x 5 min, level 1 for normal reciprocal movement pattern RUE    PATIENT EDUCATION: Education details: see above Person educated: Patient Education method: Explanation, Demonstration, Verbal cues, and Handouts Education comprehension: verbalized understanding, returned demonstration, verbal cues required, and needs further education  HOME EXERCISE PROGRAM: 06/20/23:  initial HEP for RUE/shoulder 07/09/23: Additional HEP for forearm, wrist, hand ROM and functional use/activities  of RUE   GOALS: Goals reviewed with patient? Yes  SHORT TERM GOALS: Target date: 07/20/23  Independent with initial HEP for RUE Baseline: Goal status: IN PROGRESS  2.  Pt to demo low level reaching RUE (approx 45* sh flex) in prep for functional retrieving of items from lower surfaces Baseline:  Goal status: IN PROGRESS  3.  Pt to demo 50% or greater gross composite flexion and extension Rt hand to grasp/release 1-2 cylindrical objects Baseline:  Goal status: IN PROGRESS  4.  Pt to begin using RUE consistently as min assist for BADLS and bilateral tasks Baseline:  Goal status: IN PROGRESS  5.  Pt to don Rt shoe and sock I'ly consistently with A/E prn  Baseline:  Goal status: INITIAL  6.  Pt to improve RUE function as evidenced by performing 5 blocks on Box & Blocks test Baseline: unable Goal status: INITIAL  LONG TERM GOALS: Target date: 09/18/23  Independent with updated HEP  Baseline:  Goal status: INITIAL  2.  Pt to perform mid level reaching to 90* sh flexion to retrieve light weight objects Baseline:  Goal status: INITIAL  3.  Pt to demo RUE function improvements as evidenced by performing 20 blocks or greater on Box & Blocks test Baseline:  Goal status: INITIAL  4.  Pt to be perform cooking tasks safely with min assist or less using A/E prn Baseline:  Goal status: INITIAL  5.  Pt to demo 20 lbs or greater grip strength Rt hand in order to sufficiently hold objects in Rt hand Baseline: 0 lbs Goal status: INITIAL  6.  Pt to demo sufficient coordination for typing 18 wpm at 90% accuracy or verbalize understanding of voice recognition software for work related tasks Baseline:  Goal status: INITIAL  7.  UEFI scale to improve by demo 70% or less disability RUE Baseline: 99.075% Goal status: INITIAL   ASSESSMENT:  CLINICAL IMPRESSION: Patient  seen today for occupational therapy treatment for CVA w/ Rt dominant side hemiplegia. Hx includes HTN, DM, CKD, PE. Focus on quality of movement and correct movement patterns, as well as safe, light functional tasks to perform with RUE. Patient very motivated to return to PLOF. Pt progressing towards STG's. Currently presents below baseline level of functioning demonstrating functional deficits and impairments as noted below. Pt would benefit from continued skilled OT services in the outpatient setting to work on impairments as noted below to help pt return to PLOF as able.   SABRA   PERFORMANCE DEFICITS: in functional skills including ADLs, IADLs, coordination, dexterity, proprioception, sensation, edema, tone, ROM, strength, pain, flexibility, Fine motor control, Gross motor control, mobility, balance, body mechanics, endurance, cardiopulmonary status limiting function, decreased knowledge of use of DME, vision, and UE functional use, cognitive skills including safety awareness, and psychosocial skills including coping strategies.   IMPAIRMENTS: are limiting patient from ADLs, IADLs, rest and sleep, work, leisure, and social participation.   CO-MORBIDITIES: has co-morbidities such as uncontrolled HTN that affects occupational performance. Patient will benefit from skilled OT to address above impairments and improve overall function.  MODIFICATION OR ASSISTANCE TO COMPLETE EVALUATION: No modification of tasks or assist necessary to complete an evaluation.  OT OCCUPATIONAL PROFILE AND HISTORY: Detailed assessment: Review of records and additional review of physical, cognitive, psychosocial history related to current functional performance.  CLINICAL DECISION MAKING: Moderate - several treatment options, min-mod task modification necessary  REHAB POTENTIAL: Good  EVALUATION COMPLEXITY: Moderate    PLAN:  OT FREQUENCY: 2x/week  OT DURATION: 12 weeks  PLANNED INTERVENTIONS: 97535 self care/ADL  training, 02889 therapeutic exercise, 97530 therapeutic activity, 97112 neuromuscular re-education, 97140 manual therapy, 97113 aquatic therapy, 97035 ultrasound, 97018 paraffin, 02960 fluidotherapy, 97010 moist heat, 97032 electrical stimulation (manual), 97014 electrical stimulation unattended, 97760 Orthotic Initial, 97763 Orthotic/Prosthetic subsequent, passive range of motion, functional mobility training, visual/perceptual remediation/compensation, energy conservation, coping strategies training, patient/family education, and DME and/or AE instructions  RECOMMENDED OTHER SERVICES: None at this time  CONSULTED AND AGREED WITH PLAN OF CARE: Patient  PLAN FOR NEXT SESSION: monitor BP, closed chain higher sh flexion, lower level open chain reaching, gross grasp/release   Burnard JINNY Roads, OT 07/09/2023, 10:24 AM

## 2023-07-11 ENCOUNTER — Encounter: Payer: Self-pay | Admitting: *Deleted

## 2023-07-11 ENCOUNTER — Telehealth: Payer: Self-pay | Admitting: *Deleted

## 2023-07-11 ENCOUNTER — Ambulatory Visit: Admitting: Occupational Therapy

## 2023-07-11 ENCOUNTER — Encounter: Payer: Self-pay | Admitting: Occupational Therapy

## 2023-07-11 DIAGNOSIS — R278 Other lack of coordination: Secondary | ICD-10-CM

## 2023-07-11 DIAGNOSIS — M6281 Muscle weakness (generalized): Secondary | ICD-10-CM

## 2023-07-11 DIAGNOSIS — M25511 Pain in right shoulder: Secondary | ICD-10-CM

## 2023-07-11 DIAGNOSIS — I69351 Hemiplegia and hemiparesis following cerebral infarction affecting right dominant side: Secondary | ICD-10-CM | POA: Diagnosis not present

## 2023-07-11 DIAGNOSIS — R600 Localized edema: Secondary | ICD-10-CM

## 2023-07-11 DIAGNOSIS — R208 Other disturbances of skin sensation: Secondary | ICD-10-CM

## 2023-07-11 NOTE — Patient Instructions (Signed)
 Cranial Flexion: Overhead Arm Extension - Supine ([facility])    Lie with knees bent, arms beyond head, holding paper towel roll. Lift up slowly overhead (NOT as far as in picture)  Repeat __10__ times per set. Do __2__ sets per day   2. Seated with good posture - hold plastic PVC pipe vertically with Rt hand on top, Lt hand on bottom. Raise PVC pipe vertically using Lt bottom hand to assist. Repeat 10 times. Do 2 sets per day. Keep good posture   Copyright  VHI. All rights reserved.

## 2023-07-11 NOTE — Therapy (Signed)
 OUTPATIENT OCCUPATIONAL THERAPY NEURO TREATMENT  Patient Name: Edward Cabrera MRN: [id number] DOB:27-Sep-1960, 63 y.o., male Today's Date: 07/11/2023  PCP: Celestia Rosaline SQUIBB, NP  REFERRING PROVIDER: Darci Pore, MD  END OF SESSION:  OT End of Session - 07/11/23 1021     Visit Number 3    Number of Visits 24    Date for OT Re-Evaluation 09/18/23    Authorization Type UHC, VL: 60 PT/OT/ST    OT Start Time 1020    OT Stop Time 1100    OT Time Calculation (min) 40 min    Activity Tolerance Patient tolerated treatment well    Behavior During Therapy WFL for tasks assessed/performed          Past Medical History:  Diagnosis Date   Diabetes mellitus without complication (HCC)    Gout    Hypertension    Pulmonary embolism (HCC)    Past Surgical History:  Procedure Laterality Date   ROTATOR CUFF REPAIR Left    Patient Active Problem List   Diagnosis Date Noted   Uncontrolled type 2 diabetes mellitus with hyperglycemia, without long-term current use of insulin  (HCC) 06/07/2023   Essential hypertension 06/07/2023   Hyperlipidemia 06/07/2023   Obesity (BMI 30.0-34.9) 06/07/2023   Acute CVA (cerebrovascular accident) (HCC) 06/05/2023    ONSET DATE: 06/07/2023 (referral date)   REFERRING DIAG: I63.9 (ICD-10-CM) - Acute CVA (cerebrovascular accident) (HCC)  THERAPY DIAG:  Hemiplegia and hemiparesis following cerebral infarction affecting right dominant side (HCC)  Other lack of coordination  Muscle weakness (generalized)  Other disturbances of skin sensation  Acute pain of right shoulder  Localized edema  Rationale for Evaluation and Treatment: Rehabilitation  SUBJECTIVE:   SUBJECTIVE STATEMENT: Pain Rt shoulder up to 8/10 with higher movement. I have swelling in my Rt hand and can't close my hand. My arm hurts and my hand is numb when I first wake up Pt accompanied by: self  PERTINENT HISTORY: admitted to [facility] on 06/05/23 with 1 week hx of worsening RUE  weakness, while in ED pt developed R facial droop and slurry speech. MRI  demonstrated L frontal and L occipital infarcts. PMH of PE, CKD,DM II, HTN.   PRECAUTIONS: Other: heart monitor and no heavy lifting  WEIGHT BEARING RESTRICTIONS: No  PAIN:  Are you having pain? Rt shoulder 3-8/10 - worse when lifting arm  FALLS: Has patient fallen in last 6 months? Yes. Number of falls 1  LIVING ENVIRONMENT: Lives with: lives with their family Lives in: 1 story house with 3 steps to enter Has following equipment at home: Single point cane  PLOF: Independent, working full time as Mining engineer - requires lifting/pulling, but also typing  PATIENT GOALS: use of dominant arm and hand for self care and work  OBJECTIVE:  Note: Objective measures were completed at Evaluation unless otherwise noted.  HAND DOMINANCE: Right  ADLs: Overall ADLs: mod I to min assist Transfers/ambulation related to ADLs: independent Eating: eating with Lt non dominant hand with difficulty Grooming: with Lt non dominant hand with difficulty UB Dressing: mod I with difficulty - pull over shirts only LB Dressing: assist w/ Rt shoe and sock, wearing elastic pants b/c he can't do buttons Toileting: mod I  Bathing: mod I w/ LH sponge Tub Shower transfers: mod I  Equipment: Long handled sponge and hand held shower  IADLs: Shopping: mod I  Light housekeeping: unable to fold towels or anything that requires 2 hands Meal Prep: dependent currently (pt loves to cook and  did mostly prior to CVA)  Community mobility: pt stills driving Medication management: independent Handwriting: unable Rt dominant hand, practicing w/ Lt hand  MOBILITY STATUS: Independent   FUNCTIONAL OUTCOME MEASURES: Upper Extremity Functional Scale (UEFS): 2/80 = .025 which translates to 99.075% disability RT dominant UE  UPPER EXTREMITY ROM:  minimal shoulder movement dominated by synergy pattern and no distal control. Approx 40% elbow flex, no  supination, minimal trace wrist extension and 10% finger flex/ext   UPPER EXTREMITY MMT:   NOT TESTED D/T LIMITED MOVEMENT   HAND FUNCTION: No grip strength  COORDINATION: Pt can pick up block w/ difficulty if block is stabilized but cannot maintain  SENSATION: Light touch/localization intact, but still has numbness/tingling  EDEMA: min to mod Rt hand  MUSCLE TONE: RUE: Hypotonic  COGNITION: Overall cognitive status: Within functional limits for tasks assessed  VISION: Subjective report: At first my vision was blurry and I was seeing spots, but it's getting better. Denies diplopia Baseline vision: No visual deficits Visual history: none  VISION ASSESSMENT: Will further assess in functional context     PERCEPTION: Not tested  PRAXIS: Not tested  OBSERVATIONS: Pt with shoulder pain and movement emerging RUE although limited and dominated by synergy pattern                                                                                                                             TREATMENT DATE: 07/11/23   Pt issued bed positioning handout and instructed/recommended sleeping on back or Lt side. Pt reports pain sleeping on Rt side  Pt issued edema management strategies including: elevation, retrograde massage, exercises, and compression glove recommendation at night. Pt issued handout on compression glove and told how/where to purchase  Pt no longer has loop recorder - removed today.  Attempted estim (no contraindications) for finger flexion however got more wrist flexion than fingers, therefore d/c   Pt shown additional shoulder ex's to perform for neuro re-education/retraining of RUE in correct movement pattern and provided as HEP - see pt instructions for details. Pt return demo of each with min verbal and tactile cueing for correct positioning. Pt did not have pain with either of these ex's  Pt had pain with UE Ranger, therefore d/c  UBE x 5 min, level 1 for normal  reciprocal movement pattern and UB conditioning     PATIENT EDUCATION: Education details: see above Person educated: Patient Education method: Programmer, multimedia, Demonstration, Verbal cues, and Handouts Education comprehension: verbalized understanding, returned demonstration, verbal cues required, and needs further education  HOME EXERCISE PROGRAM: 06/20/23: initial HEP for RUE/shoulder 07/09/23: Additional HEP for forearm, wrist, hand ROM and functional use/activities  of RUE 07/11/23: Additional HEP for RUE shoulder ROM   GOALS: Goals reviewed with patient? Yes  SHORT TERM GOALS: Target date: 07/20/23  Independent with initial HEP for RUE Baseline: Goal status: IN PROGRESS  2.  Pt to demo low level reaching RUE (approx 45* sh flex) in prep  for functional retrieving of items from lower surfaces Baseline:  Goal status: IN PROGRESS  3.  Pt to demo 50% or greater gross composite flexion and extension Rt hand to grasp/release 1-2 cylindrical objects Baseline:  Goal status: IN PROGRESS  4.  Pt to begin using RUE consistently as min assist for BADLS and bilateral tasks Baseline:  Goal status: IN PROGRESS  5.  Pt to don Rt shoe and sock I'ly consistently with A/E prn Baseline:  Goal status: INITIAL  6.  Pt to improve RUE function as evidenced by performing 5 blocks on Box & Blocks test Baseline: unable Goal status: INITIAL  LONG TERM GOALS: Target date: 09/18/23  Independent with updated HEP  Baseline:  Goal status: INITIAL  2.  Pt to perform mid level reaching to 90* sh flexion to retrieve light weight objects Baseline:  Goal status: INITIAL  3.  Pt to demo RUE function improvements as evidenced by performing 20 blocks or greater on Box & Blocks test Baseline:  Goal status: INITIAL  4.  Pt to be perform cooking tasks safely with min assist or less using A/E prn Baseline:  Goal status: INITIAL  5.  Pt to demo 20 lbs or greater grip strength Rt hand in order to  sufficiently hold objects in Rt hand Baseline: 0 lbs Goal status: INITIAL  6.  Pt to demo sufficient coordination for typing 18 wpm at 90% accuracy or verbalize understanding of voice recognition software for work related tasks Baseline:  Goal status: INITIAL  7.  UEFI scale to improve by demo 70% or less disability RUE Baseline: 99.075% Goal status: INITIAL   ASSESSMENT:  CLINICAL IMPRESSION: Patient seen today for occupational therapy treatment for CVA w/ Rt dominant side hemiplegia. Hx includes HTN, DM, CKD, PE. Focus on quality of movement and correct movement patterns, as well as safe, light functional tasks to perform with RUE. Patient very motivated to return to PLOF. Pt progressing towards STG's. Currently presents below baseline level of functioning demonstrating functional deficits and impairments as noted below. Pt would benefit from continued skilled OT services in the outpatient setting to work on impairments as noted below to help pt return to PLOF as able.   SABRA   PERFORMANCE DEFICITS: in functional skills including ADLs, IADLs, coordination, dexterity, proprioception, sensation, edema, tone, ROM, strength, pain, flexibility, Fine motor control, Gross motor control, mobility, balance, body mechanics, endurance, cardiopulmonary status limiting function, decreased knowledge of use of DME, vision, and UE functional use, cognitive skills including safety awareness, and psychosocial skills including coping strategies.   IMPAIRMENTS: are limiting patient from ADLs, IADLs, rest and sleep, work, leisure, and social participation.   CO-MORBIDITIES: has co-morbidities such as uncontrolled HTN that affects occupational performance. Patient will benefit from skilled OT to address above impairments and improve overall function.  MODIFICATION OR ASSISTANCE TO COMPLETE EVALUATION: No modification of tasks or assist necessary to complete an evaluation.  OT OCCUPATIONAL PROFILE AND HISTORY:  Detailed assessment: Review of records and additional review of physical, cognitive, psychosocial history related to current functional performance.  CLINICAL DECISION MAKING: Moderate - several treatment options, min-mod task modification necessary  REHAB POTENTIAL: Good  EVALUATION COMPLEXITY: Moderate    PLAN:  OT FREQUENCY: 2x/week  OT DURATION: 12 weeks  PLANNED INTERVENTIONS: 97535 self care/ADL training, 02889 therapeutic exercise, 97530 therapeutic activity, 97112 neuromuscular re-education, 97140 manual therapy, 97113 aquatic therapy, 97035 ultrasound, 97018 paraffin, 02960 fluidotherapy, 97010 moist heat, 97032 electrical stimulation (manual), 97014 electrical stimulation unattended, 97760 Orthotic  Initial, 02236 Orthotic/Prosthetic subsequent, passive range of motion, functional mobility training, visual/perceptual remediation/compensation, energy conservation, coping strategies training, patient/family education, and DME and/or AE instructions  RECOMMENDED OTHER SERVICES: None at this time  CONSULTED AND AGREED WITH PLAN OF CARE: Patient  PLAN FOR NEXT SESSION: monitor BP, review HEP's, assess if swelling in hand has improved, closed chain higher sh flexion, lower level open chain reaching, gross grasp/release   Burnard JINNY Roads, OT 07/11/2023, 10:22 AM

## 2023-07-12 ENCOUNTER — Encounter: Payer: Self-pay | Admitting: *Deleted

## 2023-07-12 ENCOUNTER — Ambulatory Visit: Admitting: Occupational Therapy

## 2023-07-12 ENCOUNTER — Telehealth: Payer: Self-pay | Admitting: *Deleted

## 2023-07-12 ENCOUNTER — Telehealth (INDEPENDENT_AMBULATORY_CARE_PROVIDER_SITE_OTHER): Payer: Self-pay | Admitting: Primary Care

## 2023-07-12 DIAGNOSIS — I639 Cerebral infarction, unspecified: Secondary | ICD-10-CM

## 2023-07-12 NOTE — Telephone Encounter (Signed)
 Pt came in stating that [employer] paperwork was not faxed over to his supervisor. Per CMA heard the pt speaking and came out to speak with him. Paperwork was given showing pit was faxed over 06/15/23. Pt then called supervisor to make him aware that information given was correct. Paperwork was faxed on that date and was faxed over for the 2nd time on 07/12/23.

## 2023-07-12 NOTE — Transitions of Care (Post Inpatient/ED Visit) (Signed)
 Transition of Care week 5  Visit Note  07/12/2023  Name: Edward Cabrera MRN: [id number]          DOB: 1960/02/04  Situation: Patient enrolled in [facility] 30-day program. Visit completed with Edward Cabrera by telephone.   Background:   Initial Transition Care Management Follow-up Telephone Call    Past Medical History:  Diagnosis Date   Diabetes mellitus without complication (HCC)    Gout    Hypertension    Pulmonary embolism (HCC)     Assessment: Patient Reported Symptoms: Cognitive Cognitive Status: No symptoms reported      Neurological Neurological Review of Symptoms: Weakness Neurological Self-Management Outcome: 3 (uncertain) Neurological Comment: Continues to have limited strength in his right hand. Working with OT twice weekly  HEENT HEENT Symptoms Reported: No symptoms reported      Cardiovascular Cardiovascular Symptoms Reported: No symptoms reported Cardiovascular Comment: Completed wearing heart monitor and returned it. Cardiology follow up on 08/09/23 to review reading.  Respiratory Respiratory Symptoms Reported: No symptoms reported    Endocrine Endocrine Symptoms Reported: No symptoms reported Endocrine Comment: Patient has not followed up with obtaining glucometer. He will check on this today  Gastrointestinal Gastrointestinal Symptoms Reported: No symptoms reported      Genitourinary Genitourinary Symptoms Reported: No symptoms reported    Integumentary Integumentary Symptoms Reported: No symptoms reported    Musculoskeletal Musculoskelatal Symptoms Reviewed: Weakness Additional Musculoskeletal Details: right hand weakness Musculoskeletal Comment: OT twice weekly      Psychosocial Psychosocial Symptoms Reported: No symptoms reported         There were no vitals filed for this visit.  Medications Reviewed Today     Reviewed by Lucky Andrea LABOR, RN (Registered Nurse) on 07/12/23 at 1409  Med List Status: <None>   Medication Order Taking? Sig Documenting  Provider Last Dose Status Informant  amLODipine  (NORVASC ) 10 MG tablet [id number] Yes Take 1 tablet (10 mg total) by mouth daily. Celestia Rosaline SQUIBB, NP  Active Self, Pharmacy Records  aspirin  EC 81 MG tablet [id number] Yes Take 1 tablet (81 mg total) by mouth daily. Swallow whole. Darci Pore, MD  Active   atorvastatin  (LIPITOR ) 80 MG tablet [id number] Yes Take 1 tablet (80 mg total) by mouth daily. Darci Pore, MD  Active   Blood Glucose Monitoring Suppl DEVI [id number]  1 each by Does not apply route in the morning, at noon, and at bedtime. May substitute to any manufacturer covered by patient's insurance.  Patient not taking: Reported on 07/12/2023   Celestia Rosaline SQUIBB, NP  Active   clopidogrel  (PLAVIX ) 75 MG tablet [id number] Yes Take 1 tablet (75 mg total) by mouth daily. Darci Pore, MD  Active   glimepiride  (AMARYL ) 2 MG tablet [id number] Yes Take 1 tablet (2 mg total) by mouth every morning. Darci Pore, MD  Active   Glucose Blood (BLOOD GLUCOSE TEST STRIPS) STRP [id number]  1 each by In Vitro route in the morning, at noon, and at bedtime. May substitute to any manufacturer covered by patient's insurance.  Patient not taking: Reported on 07/12/2023   Celestia Rosaline SQUIBB, NP  Active   Lancet Device MISC [id number]  1 each by Does not apply route in the morning, at noon, and at bedtime. May substitute to any manufacturer covered by patient's insurance.  Patient not taking: Reported on 07/12/2023   Celestia Rosaline SQUIBB, NP  Active   Lancets Misc. MISC [id number]  1 each by Does not apply route in the morning, at noon, and  at bedtime. May substitute to any manufacturer covered by patient's insurance.  Patient not taking: Reported on 07/12/2023   Celestia Rosaline SQUIBB, NP  Active   lisinopril -hydrochlorothiazide  (ZESTORETIC ) 10-12.5 MG tablet [id number] Yes Take 1 tablet by mouth daily. Newlin, Enobong, MD  Active   Omega-3 Fatty Acids (ULTRA OMEGA-3 FISH OIL ) 1400 MG  CAPS [id number] Yes Take 2,800 mg by mouth daily. [provider]  Active Self, Pharmacy Records            Recommendation:   Continue Current Plan of Care  Follow Up Plan:   Referral to RN Case Manager Closing From:  Transitions of Care Program  Andrea Dimes RN, BSN [place]  [facility] RN Care Manager [phone]

## 2023-07-13 ENCOUNTER — Telehealth (INDEPENDENT_AMBULATORY_CARE_PROVIDER_SITE_OTHER): Payer: Self-pay

## 2023-07-13 NOTE — Telephone Encounter (Signed)
 Copied from CRM [id number]. Topic: General - Other >> Jul 12, 2023  2:42 PM Berneda FALCON wrote: Reason for CRM: Pt was up here about the FMLA paperwork but the HR dept states that they  did not get it, he wants to make sure we both fax and email the documents to them.  Pt states he gave the email to the NP and wants to make sure we both fax it and email it please.

## 2023-07-13 NOTE — Telephone Encounter (Signed)
 Spoke with pt when he came to office and showed pt where form has been faxed. Pt provided an email and I have refaxed form and sent email

## 2023-07-16 ENCOUNTER — Encounter: Payer: Self-pay | Admitting: Occupational Therapy

## 2023-07-16 ENCOUNTER — Ambulatory Visit: Admitting: Occupational Therapy

## 2023-07-16 DIAGNOSIS — I69351 Hemiplegia and hemiparesis following cerebral infarction affecting right dominant side: Secondary | ICD-10-CM

## 2023-07-16 DIAGNOSIS — M6281 Muscle weakness (generalized): Secondary | ICD-10-CM

## 2023-07-16 DIAGNOSIS — R278 Other lack of coordination: Secondary | ICD-10-CM

## 2023-07-16 DIAGNOSIS — R2681 Unsteadiness on feet: Secondary | ICD-10-CM

## 2023-07-16 DIAGNOSIS — R208 Other disturbances of skin sensation: Secondary | ICD-10-CM

## 2023-07-16 DIAGNOSIS — R600 Localized edema: Secondary | ICD-10-CM

## 2023-07-16 DIAGNOSIS — M25511 Pain in right shoulder: Secondary | ICD-10-CM

## 2023-07-16 NOTE — Patient Instructions (Signed)
 Contrast Bath  Prepare the baths.  Cold = 55-65 degrees     Hot = 105-110 degrees  Starting with the hot, dip hand or foot all the way into the water and hold there for selected duration.  Preferably 3 minutes.  After selected duration is up, dip hand or foot into the cold for 1/3 duration of the hot. (3 minutes hot, 1 minute cold)  Alternate back and forth for the times indicated for no more than a total of 20 minutes ending with hot.  Elevate after doing

## 2023-07-16 NOTE — Therapy (Signed)
 OUTPATIENT OCCUPATIONAL THERAPY NEURO TREATMENT   Patient Name: Edward Cabrera MRN: [id number] DOB:May 11, 1960, 63 y.o., male Today's Date: 07/16/2023  PCP: Celestia Rosaline SQUIBB, NP  REFERRING PROVIDER: Darci Pore, MD  END OF SESSION:  OT End of Session - 07/16/23 1017     Visit Number 4    Number of Visits 24    Date for OT Re-Evaluation 09/18/23    Authorization Type [employer], VL: 60 PT/OT/ST    OT Start Time 1015    OT Stop Time 1100    OT Time Calculation (min) 45 min    Activity Tolerance Patient tolerated treatment well    Behavior During Therapy WFL for tasks assessed/performed          Past Medical History:  Diagnosis Date   Diabetes mellitus without complication (HCC)    Gout    Hypertension    Pulmonary embolism (HCC)    Past Surgical History:  Procedure Laterality Date   ROTATOR CUFF REPAIR Left    Patient Active Problem List   Diagnosis Date Noted   Uncontrolled type 2 diabetes mellitus with hyperglycemia, without long-term current use of insulin  (HCC) 06/07/2023   Essential hypertension 06/07/2023   Hyperlipidemia 06/07/2023   Obesity (BMI 30.0-34.9) 06/07/2023   Acute CVA (cerebrovascular accident) (HCC) 06/05/2023    ONSET DATE: 06/07/2023 (referral date)   REFERRING DIAG: I63.9 (ICD-10-CM) - Acute CVA (cerebrovascular accident) (HCC)  THERAPY DIAG:  Hemiplegia and hemiparesis following cerebral infarction affecting right dominant side (HCC)  Other lack of coordination  Muscle weakness (generalized)  Other disturbances of skin sensation  Acute pain of right shoulder  Localized edema  Unsteadiness on feet  Rationale for Evaluation and Treatment: Rehabilitation  SUBJECTIVE:   SUBJECTIVE STATEMENT: Pain Rt shoulder up to 8/10 with higher movement. No pain at rest. I bought a copper fit glove and am wearing it at night. Seems to help some with swelling Pt accompanied by: self  PERTINENT HISTORY: admitted to [facility] on 06/05/23 with 1  week hx of worsening RUE weakness, while in ED pt developed R facial droop and slurry speech. MRI  demonstrated L frontal and L occipital infarcts. PMH of PE, CKD,DM II, HTN.   PRECAUTIONS: Other: heart monitor and no heavy lifting  WEIGHT BEARING RESTRICTIONS: No  PAIN:  Are you having pain? Rt shoulder 3-8/10 - worse when lifting arm  FALLS: Has patient fallen in last 6 months? Yes. Number of falls 1  LIVING ENVIRONMENT: Lives with: lives with their family Lives in: 1 story house with 3 steps to enter Has following equipment at home: Single point cane  PLOF: Independent, working full time as Mining engineer - requires lifting/pulling, but also typing  PATIENT GOALS: use of dominant arm and hand for self care and work  OBJECTIVE:  Note: Objective measures were completed at Evaluation unless otherwise noted.  HAND DOMINANCE: Right  ADLs: Overall ADLs: mod I to min assist Transfers/ambulation related to ADLs: independent Eating: eating with Lt non dominant hand with difficulty Grooming: with Lt non dominant hand with difficulty UB Dressing: mod I with difficulty - pull over shirts only LB Dressing: assist w/ Rt shoe and sock, wearing elastic pants b/c he can't do buttons Toileting: mod I  Bathing: mod I w/ LH sponge Tub Shower transfers: mod I  Equipment: Long handled sponge and hand held shower  IADLs: Shopping: mod I  Light housekeeping: unable to fold towels or anything that requires 2 hands Meal Prep: dependent currently (pt loves to  cook and did mostly prior to CVA)  Community mobility: pt stills driving Medication management: independent Handwriting: unable Rt dominant hand, practicing w/ Lt hand  MOBILITY STATUS: Independent   FUNCTIONAL OUTCOME MEASURES: Upper Extremity Functional Scale (UEFS): 2/80 = .025 which translates to 99.075% disability RT dominant UE  UPPER EXTREMITY ROM:  minimal shoulder movement dominated by synergy pattern and no distal control.  Approx 40% elbow flex, no supination, minimal trace wrist extension and 10% finger flex/ext   UPPER EXTREMITY MMT:   NOT TESTED D/T LIMITED MOVEMENT   HAND FUNCTION: No grip strength  COORDINATION: Pt can pick up block w/ difficulty if block is stabilized but cannot maintain  SENSATION: Light touch/localization intact, but still has numbness/tingling  EDEMA: min to mod Rt hand  MUSCLE TONE: RUE: Hypotonic  COGNITION: Overall cognitive status: Within functional limits for tasks assessed  VISION: Subjective report: At first my vision was blurry and I was seeing spots, but it's getting better. Denies diplopia Baseline vision: No visual deficits Visual history: none  VISION ASSESSMENT: Will further assess in functional context     PERCEPTION: Not tested  PRAXIS: Not tested  OBSERVATIONS: Pt with shoulder pain and movement emerging RUE although limited and dominated by synergy pattern                                                                                                                             TREATMENT DATE: 07/16/23   Reviewed previously issued HEP's for shoulder. Pt required mod cues to perform in correct movement pattern and to prevent compensations. Pt shown normal scapulohumeral rhythm as pt tends to go into scapula protraction. Pt instructed to do self P/ROM in flexion and ex holding paper towel roll in supine. Pt then to perform table slides and other sh flex ex (holding cane/piper vertically) seated  BP = 149/109, HR = 82. Due to BP, decided to do seated activities remainder of session.   Reviewed forearm, wrist, and finger HEP seated Practiced flipping large cards over using FA supination  PATIENT EDUCATION: Education details: see above Person educated: Patient Education method: Explanation, Demonstration, Verbal cues, and Handouts Education comprehension: verbalized understanding, returned demonstration, verbal cues required, and needs further  education  HOME EXERCISE PROGRAM: 06/20/23: initial HEP for RUE/shoulder 07/09/23: Additional HEP for forearm, wrist, hand ROM and functional use/activities  of RUE 07/11/23: Additional HEP for RUE shoulder ROM   GOALS: Goals reviewed with patient? Yes  SHORT TERM GOALS: Target date: 07/20/23  Independent with initial HEP for RUE Baseline: Goal status: IN PROGRESS  2.  Pt to demo low level reaching RUE (approx 45* sh flex) in prep for functional retrieving of items from lower surfaces Baseline:  Goal status: IN PROGRESS  3.  Pt to demo 50% or greater gross composite flexion and extension Rt hand to grasp/release 1-2 cylindrical objects Baseline:  Goal status: IN PROGRESS  4.  Pt to begin using RUE consistently as min assist  for BADLS and bilateral tasks Baseline:  Goal status: IN PROGRESS  5.  Pt to don Rt shoe and sock I'ly consistently with A/E prn Baseline:  Goal status: INITIAL  6.  Pt to improve RUE function as evidenced by performing 5 blocks on Box & Blocks test Baseline: unable Goal status: INITIAL  LONG TERM GOALS: Target date: 09/18/23  Independent with updated HEP  Baseline:  Goal status: INITIAL  2.  Pt to perform mid level reaching to 90* sh flexion to retrieve light weight objects Baseline:  Goal status: INITIAL  3.  Pt to demo RUE function improvements as evidenced by performing 20 blocks or greater on Box & Blocks test Baseline:  Goal status: INITIAL  4.  Pt to be perform cooking tasks safely with min assist or less using A/E prn Baseline:  Goal status: INITIAL  5.  Pt to demo 20 lbs or greater grip strength Rt hand in order to sufficiently hold objects in Rt hand Baseline: 0 lbs Goal status: INITIAL  6.  Pt to demo sufficient coordination for typing 18 wpm at 90% accuracy or verbalize understanding of voice recognition software for work related tasks Baseline:  Goal status: INITIAL  7.  UEFI scale to improve by demo 70% or less disability  RUE Baseline: 99.075% Goal status: INITIAL   ASSESSMENT:  CLINICAL IMPRESSION: Patient seen today for occupational therapy treatment for CVA w/ Rt dominant side hemiplegia. Hx includes HTN, DM, CKD, PE. Focus today on quality of movement and correct movement patterns, as pt continues to roll shoulders forward with scapula protraction and shoulder IR and abduction. Patient very motivated to return to PLOF. Pt progressing towards STG's. Currently presents below baseline level of functioning demonstrating functional deficits and impairments as noted below. Pt would benefit from continued skilled OT services in the outpatient setting to work on impairments as noted below to help pt return to PLOF as able.   SABRA   PERFORMANCE DEFICITS: in functional skills including ADLs, IADLs, coordination, dexterity, proprioception, sensation, edema, tone, ROM, strength, pain, flexibility, Fine motor control, Gross motor control, mobility, balance, body mechanics, endurance, cardiopulmonary status limiting function, decreased knowledge of use of DME, vision, and UE functional use, cognitive skills including safety awareness, and psychosocial skills including coping strategies.   IMPAIRMENTS: are limiting patient from ADLs, IADLs, rest and sleep, work, leisure, and social participation.   CO-MORBIDITIES: has co-morbidities such as uncontrolled HTN that affects occupational performance. Patient will benefit from skilled OT to address above impairments and improve overall function.  MODIFICATION OR ASSISTANCE TO COMPLETE EVALUATION: No modification of tasks or assist necessary to complete an evaluation.  OT OCCUPATIONAL PROFILE AND HISTORY: Detailed assessment: Review of records and additional review of physical, cognitive, psychosocial history related to current functional performance.  CLINICAL DECISION MAKING: Moderate - several treatment options, min-mod task modification necessary  REHAB POTENTIAL:  Good  EVALUATION COMPLEXITY: Moderate    PLAN:  OT FREQUENCY: 2x/week  OT DURATION: 12 weeks  PLANNED INTERVENTIONS: 97535 self care/ADL training, 02889 therapeutic exercise, 97530 therapeutic activity, 97112 neuromuscular re-education, 97140 manual therapy, 97113 aquatic therapy, 97035 ultrasound, 97018 paraffin, 02960 fluidotherapy, 97010 moist heat, 97032 electrical stimulation (manual), 97014 electrical stimulation unattended, 97760 Orthotic Initial, 97763 Orthotic/Prosthetic subsequent, passive range of motion, functional mobility training, visual/perceptual remediation/compensation, energy conservation, coping strategies training, patient/family education, and DME and/or AE instructions  RECOMMENDED OTHER SERVICES: None at this time  CONSULTED AND AGREED WITH PLAN OF CARE: Patient  PLAN FOR NEXT SESSION: monitor  BP, if able - work prone for scapula strengthening, continue low level open chain reaching and functional grasp/release   Burnard JINNY Roads, OT 07/16/2023, 10:17 AM

## 2023-07-17 ENCOUNTER — Ambulatory Visit: Payer: Self-pay | Admitting: Cardiology

## 2023-07-17 ENCOUNTER — Ambulatory Visit

## 2023-07-17 DIAGNOSIS — I639 Cerebral infarction, unspecified: Secondary | ICD-10-CM | POA: Diagnosis not present

## 2023-07-18 ENCOUNTER — Encounter: Payer: Self-pay | Admitting: Occupational Therapy

## 2023-07-18 ENCOUNTER — Ambulatory Visit: Admitting: Occupational Therapy

## 2023-07-18 DIAGNOSIS — M6281 Muscle weakness (generalized): Secondary | ICD-10-CM

## 2023-07-18 DIAGNOSIS — R208 Other disturbances of skin sensation: Secondary | ICD-10-CM

## 2023-07-18 DIAGNOSIS — M25511 Pain in right shoulder: Secondary | ICD-10-CM

## 2023-07-18 DIAGNOSIS — I69351 Hemiplegia and hemiparesis following cerebral infarction affecting right dominant side: Secondary | ICD-10-CM

## 2023-07-18 DIAGNOSIS — R278 Other lack of coordination: Secondary | ICD-10-CM

## 2023-07-18 DIAGNOSIS — R600 Localized edema: Secondary | ICD-10-CM

## 2023-07-18 NOTE — Therapy (Signed)
 OUTPATIENT OCCUPATIONAL THERAPY NEURO TREATMENT   Patient Name: Edward Cabrera MRN: [id number] DOB:08-Oct-1960, 63 y.o., male Today's Date: 07/18/2023  PCP: Celestia Rosaline SQUIBB, NP  REFERRING PROVIDER: Darci Pore, MD  END OF SESSION:  OT End of Session - 07/18/23 1028     Visit Number 5    Number of Visits 24    Date for OT Re-Evaluation 09/18/23    Authorization Type [employer], VL: 60 PT/OT/ST    OT Start Time 1022    OT Stop Time 1101    OT Time Calculation (min) 39 min    Activity Tolerance Patient tolerated treatment well    Behavior During Therapy WFL for tasks assessed/performed          Past Medical History:  Diagnosis Date   Diabetes mellitus without complication (HCC)    Gout    Hypertension    Pulmonary embolism (HCC)    Past Surgical History:  Procedure Laterality Date   ROTATOR CUFF REPAIR Left    Patient Active Problem List   Diagnosis Date Noted   Uncontrolled type 2 diabetes mellitus with hyperglycemia, without long-term current use of insulin  (HCC) 06/07/2023   Essential hypertension 06/07/2023   Hyperlipidemia 06/07/2023   Obesity (BMI 30.0-34.9) 06/07/2023   Acute CVA (cerebrovascular accident) (HCC) 06/05/2023    ONSET DATE: 06/07/2023 (referral date)   REFERRING DIAG: I63.9 (ICD-10-CM) - Acute CVA (cerebrovascular accident) (HCC)  THERAPY DIAG:  Hemiplegia and hemiparesis following cerebral infarction affecting right dominant side (HCC)  Other lack of coordination  Muscle weakness (generalized)  Other disturbances of skin sensation  Acute pain of right shoulder  Localized edema  Rationale for Evaluation and Treatment: Rehabilitation  SUBJECTIVE:   SUBJECTIVE STATEMENT: Pain Rt shoulder up to 8/10 with higher movement. No pain at rest.  Pt accompanied by: self  PERTINENT HISTORY: admitted to [facility] on 06/05/23 with 1 week hx of worsening RUE weakness, while in ED pt developed R facial droop and slurry speech. MRI  demonstrated  L frontal and L occipital infarcts. PMH of PE, CKD,DM II, HTN.   PRECAUTIONS: Other: heart monitor and no heavy lifting  WEIGHT BEARING RESTRICTIONS: No  PAIN:  Are you having pain? Rt shoulder 3-8/10 - worse when lifting arm  FALLS: Has patient fallen in last 6 months? Yes. Number of falls 1  LIVING ENVIRONMENT: Lives with: lives with their family Lives in: 1 story house with 3 steps to enter Has following equipment at home: Single point cane  PLOF: Independent, working full time as Mining engineer - requires lifting/pulling, but also typing  PATIENT GOALS: use of dominant arm and hand for self care and work  OBJECTIVE:  Note: Objective measures were completed at Evaluation unless otherwise noted.  HAND DOMINANCE: Right  ADLs: Overall ADLs: mod I to min assist Transfers/ambulation related to ADLs: independent Eating: eating with Lt non dominant hand with difficulty Grooming: with Lt non dominant hand with difficulty UB Dressing: mod I with difficulty - pull over shirts only LB Dressing: assist w/ Rt shoe and sock, wearing elastic pants b/c he can't do buttons Toileting: mod I  Bathing: mod I w/ LH sponge Tub Shower transfers: mod I  Equipment: Long handled sponge and hand held shower  IADLs: Shopping: mod I  Light housekeeping: unable to fold towels or anything that requires 2 hands Meal Prep: dependent currently (pt loves to cook and did mostly prior to CVA)  Community mobility: pt stills driving Medication management: independent Handwriting: unable Rt dominant hand,  practicing w/ Lt hand  MOBILITY STATUS: Independent   FUNCTIONAL OUTCOME MEASURES: Upper Extremity Functional Scale (UEFS): 2/80 = .025 which translates to 99.075% disability RT dominant UE  UPPER EXTREMITY ROM:  minimal shoulder movement dominated by synergy pattern and no distal control. Approx 40% elbow flex, no supination, minimal trace wrist extension and 10% finger flex/ext   UPPER EXTREMITY  MMT:   NOT TESTED D/T LIMITED MOVEMENT   HAND FUNCTION: No grip strength  COORDINATION: Pt can pick up block w/ difficulty if block is stabilized but cannot maintain  SENSATION: Light touch/localization intact, but still has numbness/tingling  EDEMA: min to mod Rt hand  MUSCLE TONE: RUE: Hypotonic  COGNITION: Overall cognitive status: Within functional limits for tasks assessed  VISION: Subjective report: At first my vision was blurry and I was seeing spots, but it's getting better. Denies diplopia Baseline vision: No visual deficits Visual history: none  VISION ASSESSMENT: Will further assess in functional context     PERCEPTION: Not tested  PRAXIS: Not tested  OBSERVATIONS: Pt with shoulder pain and movement emerging RUE although limited and dominated by synergy pattern                                                                                                                             TREATMENT DATE: 07/18/23   BP = 136/99  Pt issued prone HEP for posterior shoulder and scapula strengthening - see pt instructions for details.  Pt did well with this and will be helpful as pt is weak posteriorly. Unable to do prone on elbows d/t pain.   Pt has pain anterior shoulder and is point tender at biceps head attachment and along pects.  K-taping to relax biceps, relax pects, and activate trunk extension/scapula retraction. Pt educated in K-tape wear and care.   UBE x 7 min, level 1 for normal reciprocal movement pattern and UB conditioning   BP = 139/99  PATIENT EDUCATION: Education details: see above Person educated: Patient Education method: Explanation, Demonstration, Verbal cues, and Handouts Education comprehension: verbalized understanding, returned demonstration, verbal cues required, and needs further education  HOME EXERCISE PROGRAM: 06/20/23: initial HEP for RUE/shoulder 07/09/23: Additional HEP for forearm, wrist, hand ROM and functional  use/activities  of RUE 07/11/23: Additional HEP for RUE shoulder ROM   GOALS: Goals reviewed with patient? Yes  SHORT TERM GOALS: Target date: 07/20/23  Independent with initial HEP for RUE Baseline: Goal status: MET  2.  Pt to demo low level reaching RUE (approx 45* sh flex) in prep for functional retrieving of items from lower surfaces Baseline:  Goal status: IN PROGRESS  3.  Pt to demo 50% or greater gross composite flexion and extension Rt hand to grasp/release 1-2 cylindrical objects Baseline:  Goal status: IN PROGRESS  4.  Pt to begin using RUE consistently as min assist for BADLS and bilateral tasks Baseline:  Goal status: IN PROGRESS  5.  Pt to don Rt shoe and  sock I'ly consistently with A/E prn Baseline:  Goal status: MET (Except laces)   6.  Pt to improve RUE function as evidenced by performing 5 blocks on Box & Blocks test Baseline: unable Goal status: INITIAL  LONG TERM GOALS: Target date: 09/18/23  Independent with updated HEP  Baseline:  Goal status: INITIAL  2.  Pt to perform mid level reaching to 90* sh flexion to retrieve light weight objects Baseline:  Goal status: INITIAL  3.  Pt to demo RUE function improvements as evidenced by performing 20 blocks or greater on Box & Blocks test Baseline:  Goal status: INITIAL  4.  Pt to be perform cooking tasks safely with min assist or less using A/E prn Baseline:  Goal status: INITIAL  5.  Pt to demo 20 lbs or greater grip strength Rt hand in order to sufficiently hold objects in Rt hand Baseline: 0 lbs Goal status: INITIAL  6.  Pt to demo sufficient coordination for typing 18 wpm at 90% accuracy or verbalize understanding of voice recognition software for work related tasks Baseline:  Goal status: INITIAL  7.  UEFI scale to improve by demo 70% or less disability RUE Baseline: 99.075% Goal status: INITIAL   ASSESSMENT:  CLINICAL IMPRESSION: Patient seen today for occupational therapy treatment  for CVA w/ Rt dominant side hemiplegia. Hx includes HTN, DM, CKD, PE. Focus today on posterior shoulder girdle/scapula strengthening and pain management. Patient very motivated to return to PLOF. Pt progressing towards STG's. Currently presents below baseline level of functioning demonstrating functional deficits and impairments as noted below. Pt would benefit from continued skilled OT services in the outpatient setting to work on impairments as noted below to help pt return to PLOF as able.   SABRA   PERFORMANCE DEFICITS: in functional skills including ADLs, IADLs, coordination, dexterity, proprioception, sensation, edema, tone, ROM, strength, pain, flexibility, Fine motor control, Gross motor control, mobility, balance, body mechanics, endurance, cardiopulmonary status limiting function, decreased knowledge of use of DME, vision, and UE functional use, cognitive skills including safety awareness, and psychosocial skills including coping strategies.   IMPAIRMENTS: are limiting patient from ADLs, IADLs, rest and sleep, work, leisure, and social participation.   CO-MORBIDITIES: has co-morbidities such as uncontrolled HTN that affects occupational performance. Patient will benefit from skilled OT to address above impairments and improve overall function.  MODIFICATION OR ASSISTANCE TO COMPLETE EVALUATION: No modification of tasks or assist necessary to complete an evaluation.  OT OCCUPATIONAL PROFILE AND HISTORY: Detailed assessment: Review of records and additional review of physical, cognitive, psychosocial history related to current functional performance.  CLINICAL DECISION MAKING: Moderate - several treatment options, min-mod task modification necessary  REHAB POTENTIAL: Good  EVALUATION COMPLEXITY: Moderate    PLAN:  OT FREQUENCY: 2x/week  OT DURATION: 12 weeks  PLANNED INTERVENTIONS: 97535 self care/ADL training, 02889 therapeutic exercise, 97530 therapeutic activity, 97112 neuromuscular  re-education, 97140 manual therapy, 97113 aquatic therapy, 97035 ultrasound, 97018 paraffin, 02960 fluidotherapy, 97010 moist heat, 97032 electrical stimulation (manual), 97014 electrical stimulation unattended, 97760 Orthotic Initial, 97763 Orthotic/Prosthetic subsequent, passive range of motion, functional mobility training, visual/perceptual remediation/compensation, energy conservation, coping strategies training, patient/family education, and DME and/or AE instructions  RECOMMENDED OTHER SERVICES: None at this time  CONSULTED AND AGREED WITH PLAN OF CARE: Patient  PLAN FOR NEXT SESSION: monitor BP,  review prone ex's, shoe shoe buttons for shoelaces, low level reaching, grasp/release, assess K-tape   Burnard JINNY Roads, OT 07/18/2023, 10:28 AM

## 2023-07-18 NOTE — Patient Instructions (Signed)
  Scapular Retraction (Prone)    Lie with arms at sides. Pinch shoulder blades together and raise shoulders a few inches from floor towards ceiling (NOT towards ears). Repeat __10__ times per set. Do ____ sets per session. Do __2__ sessions per day.   Extension - Prone (Dumbbell)    Lie with right arm hanging off side of bed. Lift hand back and up. Repeat _10___ times per set. Do __2__ sets per day

## 2023-07-20 ENCOUNTER — Telehealth: Payer: Self-pay | Admitting: Primary Care

## 2023-07-20 NOTE — Telephone Encounter (Signed)
 Called patient to confirm upcoming appointment 07/24/2023 at 10:30 am with Clinical Pharmacist. Patient appointment has been successfully confirmed.

## 2023-07-23 ENCOUNTER — Ambulatory Visit: Admitting: Occupational Therapy

## 2023-07-23 DIAGNOSIS — R2681 Unsteadiness on feet: Secondary | ICD-10-CM

## 2023-07-23 DIAGNOSIS — I69351 Hemiplegia and hemiparesis following cerebral infarction affecting right dominant side: Secondary | ICD-10-CM

## 2023-07-23 DIAGNOSIS — R600 Localized edema: Secondary | ICD-10-CM

## 2023-07-23 DIAGNOSIS — R278 Other lack of coordination: Secondary | ICD-10-CM

## 2023-07-23 DIAGNOSIS — R208 Other disturbances of skin sensation: Secondary | ICD-10-CM

## 2023-07-23 DIAGNOSIS — M6281 Muscle weakness (generalized): Secondary | ICD-10-CM

## 2023-07-23 DIAGNOSIS — M25511 Pain in right shoulder: Secondary | ICD-10-CM

## 2023-07-23 NOTE — Therapy (Signed)
 OUTPATIENT OCCUPATIONAL THERAPY NEURO TREATMENT   Patient Name: Edward Cabrera MRN: [id number] DOB:1960-01-15, 63 y.o., male Today's Date: 07/23/2023  PCP: Celestia Rosaline SQUIBB, NP  REFERRING PROVIDER: Darci Pore, MD  END OF SESSION:  OT End of Session - 07/23/23 0800     Visit Number 6    Number of Visits 24    Date for OT Re-Evaluation 09/18/23    Authorization Type [employer], VL: 60 PT/OT/ST    OT Start Time 0800    OT Stop Time 0845    OT Time Calculation (min) 45 min    Activity Tolerance Patient tolerated treatment well    Behavior During Therapy WFL for tasks assessed/performed          Past Medical History:  Diagnosis Date   Diabetes mellitus without complication (HCC)    Gout    Hypertension    Pulmonary embolism (HCC)    Past Surgical History:  Procedure Laterality Date   ROTATOR CUFF REPAIR Left    Patient Active Problem List   Diagnosis Date Noted   Uncontrolled type 2 diabetes mellitus with hyperglycemia, without long-term current use of insulin  (HCC) 06/07/2023   Essential hypertension 06/07/2023   Hyperlipidemia 06/07/2023   Obesity (BMI 30.0-34.9) 06/07/2023   Acute CVA (cerebrovascular accident) (HCC) 06/05/2023    ONSET DATE: 06/07/2023 (referral date)   REFERRING DIAG: I63.9 (ICD-10-CM) - Acute CVA (cerebrovascular accident) (HCC)  THERAPY DIAG:  Hemiplegia and hemiparesis following cerebral infarction affecting right dominant side (HCC)  Other lack of coordination  Muscle weakness (generalized)  Other disturbances of skin sensation  Acute pain of right shoulder  Localized edema  Unsteadiness on feet  Rationale for Evaluation and Treatment: Rehabilitation  SUBJECTIVE:   SUBJECTIVE STATEMENT: Pain Rt shoulder up to 8/10 with higher movement. No pain at rest. The tape helped a bit but still hurts. I didn't take my BP meds this morning - it was too early Pt accompanied by: self  PERTINENT HISTORY: admitted to [facility] on 06/05/23  with 1 week hx of worsening RUE weakness, while in ED pt developed R facial droop and slurry speech. MRI  demonstrated L frontal and L occipital infarcts. PMH of PE, CKD,DM II, HTN.   PRECAUTIONS: Other: heart monitor and no heavy lifting  WEIGHT BEARING RESTRICTIONS: No  PAIN:  Are you having pain? Rt shoulder 3-8/10 - worse when lifting arm  FALLS: Has patient fallen in last 6 months? Yes. Number of falls 1  LIVING ENVIRONMENT: Lives with: lives with their family Lives in: 1 story house with 3 steps to enter Has following equipment at home: Single point cane  PLOF: Independent, working full time as Mining engineer - requires lifting/pulling, but also typing  PATIENT GOALS: use of dominant arm and hand for self care and work  OBJECTIVE:  Note: Objective measures were completed at Evaluation unless otherwise noted.  HAND DOMINANCE: Right  ADLs: Overall ADLs: mod I to min assist Transfers/ambulation related to ADLs: independent Eating: eating with Lt non dominant hand with difficulty Grooming: with Lt non dominant hand with difficulty UB Dressing: mod I with difficulty - pull over shirts only LB Dressing: assist w/ Rt shoe and sock, wearing elastic pants b/c he can't do buttons Toileting: mod I  Bathing: mod I w/ LH sponge Tub Shower transfers: mod I  Equipment: Long handled sponge and hand held shower  IADLs: Shopping: mod I  Light housekeeping: unable to fold towels or anything that requires 2 hands Meal Prep: dependent currently (  pt loves to cook and did mostly prior to CVA)  Community mobility: pt stills driving Medication management: independent Handwriting: unable Rt dominant hand, practicing w/ Lt hand  MOBILITY STATUS: Independent   FUNCTIONAL OUTCOME MEASURES: Upper Extremity Functional Scale (UEFS): 2/80 = .025 which translates to 99.075% disability RT dominant UE  UPPER EXTREMITY ROM:  minimal shoulder movement dominated by synergy pattern and no distal  control. Approx 40% elbow flex, no supination, minimal trace wrist extension and 10% finger flex/ext   UPPER EXTREMITY MMT:   NOT TESTED D/T LIMITED MOVEMENT   HAND FUNCTION: No grip strength  COORDINATION: Pt can pick up block w/ difficulty if block is stabilized but cannot maintain  SENSATION: Light touch/localization intact, but still has numbness/tingling  EDEMA: min to mod Rt hand  MUSCLE TONE: RUE: Hypotonic  COGNITION: Overall cognitive status: Within functional limits for tasks assessed  VISION: Subjective report: At first my vision was blurry and I was seeing spots, but it's getting better. Denies diplopia Baseline vision: No visual deficits Visual history: none  VISION ASSESSMENT: Will further assess in functional context     PERCEPTION: Not tested  PRAXIS: Not tested  OBSERVATIONS: Pt with shoulder pain and movement emerging RUE although limited and dominated by synergy pattern                                                                                                                             TREATMENT DATE: 07/23/23   BP = 135/93  Reviewed shoulder ex's in prone and supine - pt with continued min cues to perform correctly and prevent compensations.   BP after above ex's 158/103.   Pt shown shoe buttons for tying shoes however pt not interested. He is fine with using either slip on shoes, velcro style shoes, or leaving laces untied w/ regular tennis shoes.   Worked on functional low to mid level reaching with grasp and release RUE seated at table - placing checkers into Connect 4 slots and in cup.   Pt says he will go home and eat breakfast and take BP meds.   Pt also still has K-tape on. Pt instructed to remove today and will tape again on Wednesday if he wishes. Pt reports minimal help from tape with pain  PATIENT EDUCATION: Education details: see above Person educated: Patient Education method: Explanation, Demonstration, Verbal cues,  and Handouts Education comprehension: verbalized understanding, returned demonstration, verbal cues required, and needs further education  HOME EXERCISE PROGRAM: 06/20/23: initial HEP for RUE/shoulder 07/09/23: Additional HEP for forearm, wrist, hand ROM and functional use/activities  of RUE 07/11/23: Additional HEP for RUE shoulder ROM   GOALS: Goals reviewed with patient? Yes  SHORT TERM GOALS: Target date: 07/20/23  Independent with initial HEP for RUE Baseline: Goal status: MET  2.  Pt to demo low level reaching RUE (approx 45* sh flex) in prep for functional retrieving of items from lower surfaces Baseline:  Goal status: IN PROGRESS  3.  Pt to demo 50% or greater gross composite flexion and extension Rt hand to grasp/release 1-2 cylindrical objects Baseline:  Goal status: IN PROGRESS  4.  Pt to begin using RUE consistently as min assist for BADLS and bilateral tasks Baseline:  Goal status: IN PROGRESS  5.  Pt to don Rt shoe and sock I'ly consistently with A/E prn Baseline:  Goal status: MET (Except laces)   6.  Pt to improve RUE function as evidenced by performing 5 blocks on Box & Blocks test Baseline: unable Goal status: INITIAL  LONG TERM GOALS: Target date: 09/18/23  Independent with updated HEP  Baseline:  Goal status: INITIAL  2.  Pt to perform mid level reaching to 90* sh flexion to retrieve light weight objects Baseline:  Goal status: INITIAL  3.  Pt to demo RUE function improvements as evidenced by performing 20 blocks or greater on Box & Blocks test Baseline:  Goal status: INITIAL  4.  Pt to be perform cooking tasks safely with min assist or less using A/E prn Baseline:  Goal status: INITIAL  5.  Pt to demo 20 lbs or greater grip strength Rt hand in order to sufficiently hold objects in Rt hand Baseline: 0 lbs Goal status: INITIAL  6.  Pt to demo sufficient coordination for typing 18 wpm at 90% accuracy or verbalize understanding of voice  recognition software for work related tasks Baseline:  Goal status: INITIAL  7.  UEFI scale to improve by demo 70% or less disability RUE Baseline: 99.075% Goal status: INITIAL   ASSESSMENT:  CLINICAL IMPRESSION: Patient seen today for occupational therapy treatment for CVA w/ Rt dominant side hemiplegia. Hx includes HTN, DM, CKD, PE. Focus today on posterior shoulder girdle/scapula strengthening and pain management. Patient very motivated to return to PLOF. Pt progressing towards STG's. Currently presents below baseline level of functioning demonstrating functional deficits and impairments as noted below. Pt would benefit from continued skilled OT services in the outpatient setting to work on impairments as noted below to help pt return to PLOF as able.   SABRA   PERFORMANCE DEFICITS: in functional skills including ADLs, IADLs, coordination, dexterity, proprioception, sensation, edema, tone, ROM, strength, pain, flexibility, Fine motor control, Gross motor control, mobility, balance, body mechanics, endurance, cardiopulmonary status limiting function, decreased knowledge of use of DME, vision, and UE functional use, cognitive skills including safety awareness, and psychosocial skills including coping strategies.   IMPAIRMENTS: are limiting patient from ADLs, IADLs, rest and sleep, work, leisure, and social participation.   CO-MORBIDITIES: has co-morbidities such as uncontrolled HTN that affects occupational performance. Patient will benefit from skilled OT to address above impairments and improve overall function.  MODIFICATION OR ASSISTANCE TO COMPLETE EVALUATION: No modification of tasks or assist necessary to complete an evaluation.  OT OCCUPATIONAL PROFILE AND HISTORY: Detailed assessment: Review of records and additional review of physical, cognitive, psychosocial history related to current functional performance.  CLINICAL DECISION MAKING: Moderate - several treatment options, min-mod  task modification necessary  REHAB POTENTIAL: Good  EVALUATION COMPLEXITY: Moderate    PLAN:  OT FREQUENCY: 2x/week  OT DURATION: 12 weeks  PLANNED INTERVENTIONS: 97535 self care/ADL training, 02889 therapeutic exercise, 97530 therapeutic activity, 97112 neuromuscular re-education, 97140 manual therapy, 97113 aquatic therapy, 97035 ultrasound, 97018 paraffin, 02960 fluidotherapy, 97010 moist heat, 97032 electrical stimulation (manual), 97014 electrical stimulation unattended, 97760 Orthotic Initial, 97763 Orthotic/Prosthetic subsequent, passive range of motion, functional mobility training, visual/perceptual remediation/compensation, energy conservation, coping strategies training, patient/family education, and DME  and/or AE instructions  RECOMMENDED OTHER SERVICES: None at this time  CONSULTED AND AGREED WITH PLAN OF CARE: Patient  PLAN FOR NEXT SESSION: monitor BP,  ? K-tape again, continue neuro re-educ and functional use RUE   Burnard JINNY Roads, OT 07/23/2023, 8:01 AM

## 2023-07-24 ENCOUNTER — Ambulatory Visit: Attending: Primary Care | Admitting: Pharmacist

## 2023-07-24 ENCOUNTER — Encounter: Payer: Self-pay | Admitting: Pharmacist

## 2023-07-24 VITALS — BP 155/84

## 2023-07-24 DIAGNOSIS — I10 Essential (primary) hypertension: Secondary | ICD-10-CM

## 2023-07-24 MED ORDER — VALSARTAN-HYDROCHLOROTHIAZIDE 160-25 MG PO TABS: 1.0000 | ORAL_TABLET | Freq: Every day | ORAL | 1 refills | Status: AC

## 2023-07-24 NOTE — Progress Notes (Signed)
 S:     No chief complaint on file.  63 y.o. male who presents for hypertension evaluation, education, and management.   Patient was originally referred by Primary Care Provider, Rosaline Bohr, on 05/21/2023. At that visit, BP was uncontrolled at 171/114 with repeat reading down to 148/98. Unfortunately, he was admitted 6/3 - 06/07/23 with acute left posterior frontal and left occipital infarcts. Was discharged on DAPT x3 months followed by ASA alone. Hospital service also recommended high intensity statin therapy. They resumed his amlodipine  and lisinopril -hydrochlorothiazide  on discharge. He subsequently saw PCP on 06/13/23 and BP continued to be elevated at 164/117.  I saw him on 06/21/23 and BP was borderline low. We suspected dehydration and labs from 6/19 confirmed. Scr jumped from 1.43 prior to 2.10. We recommended PO hydration and for him to continue BP medications. Repeat creatinine on 06/28/23 was back to baseline at 1.45.   PMH is significant for T2DM, gout, HTN, PE and CVA.   Today, patient arrives in good spirits and presents without assistance. Endorses occasional headache and occasional blurry vision when large changes in light level.  Denies dizziness and swelling. Patient reports hypertension is longstanding.  Family/Social history: -Fhx: HTN (mother), DM (mother), CKD (father) -Prior EtOH use, discontinued ~ 1 week prior to CVA; never smoker, former marijuana smoker (quick after CVA)  Medication adherence seems optimal. 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  O:  Vitals:   07/24/23 1724  BP: (!) 155/84     Last 3 Office BP readings: BP Readings from Last 3 Encounters:  07/24/23 (!) 155/84  06/21/23 106/74  06/20/23 (!) 142/96   BMET    Component Value Date/Time   NA 140 06/29/2023 1043   K 4.4 06/29/2023 1043   CL 102 06/29/2023 1043   CO2 23 06/29/2023 1043   GLUCOSE 138 (H) 06/29/2023 1043   GLUCOSE 207 (H) 06/06/2023 0624   BUN 15 06/29/2023 1043   CREATININE 1.45 (H) 06/29/2023 1043   CALCIUM  10.0 06/29/2023 1043   GFRNONAA 56 (L) 06/06/2023 0624   GFRAA 73 04/21/2019 1623    Renal function: CrCl cannot be calculated (Patient's most recent lab result is older than the maximum 21 days allowed.).  Clinical ASCVD: Yes  The ASCVD Risk score (Arnett DK, et al., 2019) failed to calculate for the following reasons:   Risk score cannot be calculated because patient has a medical history suggesting prior/existing ASCVD   A/P: Hypertension longstanding currently uncontrolled on current medications. BP goal < 130/80 mmHg. Medication adherence appears optimal. Renal function has now stabilized. We will change from lisinopril -hydrochlorothiazide  to valsartan -hydrochlorothiazide .  -Continue amlodipine  10 mg daily -DISCONTINUE lisinopril -hydrochlorothiazide . -START valsartan -hydrochlorothiazide  160-25 mg daily.  -Patient educated on purpose, proper use, and potential adverse effects of amlodipine , valsartan , and hydrochlorothiazide .  -F/u labs ordered - none today -Counseled on lifestyle modifications for blood pressure control including reduced dietary sodium, increased exercise, adequate sleep. -Encouraged patient to check BP at home and bring log of readings to next visit. Counseled on proper use of home BP cuff.   Results reviewed and written information provided.    Written patient instructions provided. Patient verbalized understanding of treatment plan.  Total time  in face to face counseling 30 minutes.    Follow-up:  Pharmacist 1 month  Herlene Fleeta Morris, PharmD,  [place], CPP Clinical Pharmacist [facility] [phone]

## 2023-07-25 ENCOUNTER — Encounter: Payer: Self-pay | Admitting: Occupational Therapy

## 2023-07-25 ENCOUNTER — Ambulatory Visit: Admitting: Occupational Therapy

## 2023-07-25 DIAGNOSIS — M25511 Pain in right shoulder: Secondary | ICD-10-CM

## 2023-07-25 DIAGNOSIS — M6281 Muscle weakness (generalized): Secondary | ICD-10-CM

## 2023-07-25 DIAGNOSIS — R208 Other disturbances of skin sensation: Secondary | ICD-10-CM

## 2023-07-25 DIAGNOSIS — R278 Other lack of coordination: Secondary | ICD-10-CM

## 2023-07-25 DIAGNOSIS — I69351 Hemiplegia and hemiparesis following cerebral infarction affecting right dominant side: Secondary | ICD-10-CM | POA: Diagnosis not present

## 2023-07-25 DIAGNOSIS — R600 Localized edema: Secondary | ICD-10-CM

## 2023-07-25 NOTE — Patient Instructions (Signed)
1. Grip Strengthening (Resistive Putty)   Squeeze putty using thumb and all fingers. Repeat _20___ times. Do __2__ sessions per day.   2. Roll putty into tube on table and pinch between first two fingers and thumb x 10 reps. Do 2 sessions per day     Copyright  VHI. All rights reserved.     

## 2023-07-25 NOTE — Therapy (Signed)
 OUTPATIENT OCCUPATIONAL THERAPY NEURO TREATMENT   Patient Name: LEVAR FAYSON MRN: [id number] DOB:1960/07/03, 63 y.o., male Today's Date: 07/25/2023  PCP: Celestia Rosaline SQUIBB, NP  REFERRING PROVIDER: Darci Pore, MD  END OF SESSION:  OT End of Session - 07/25/23 1026     Visit Number 7    Number of Visits 24    Date for OT Re-Evaluation 09/18/23    Authorization Type U[employer], VL: 60 PT/OT/ST    OT Start Time 1015    OT Stop Time 1100    OT Time Calculation (min) 45 min    Activity Tolerance Patient tolerated treatment well    Behavior During Therapy WFL for tasks assessed/performed          Past Medical History:  Diagnosis Date   Diabetes mellitus without complication (HCC)    Gout    Hypertension    Pulmonary embolism (HCC)    Past Surgical History:  Procedure Laterality Date   ROTATOR CUFF REPAIR Left    Patient Active Problem List   Diagnosis Date Noted   Uncontrolled type 2 diabetes mellitus with hyperglycemia, without long-term current use of insulin  (HCC) 06/07/2023   Essential hypertension 06/07/2023   Hyperlipidemia 06/07/2023   Obesity (BMI 30.0-34.9) 06/07/2023   Acute CVA (cerebrovascular accident) (HCC) 06/05/2023    ONSET DATE: 06/07/2023 (referral date)   REFERRING DIAG: I63.9 (ICD-10-CM) - Acute CVA (cerebrovascular accident) (HCC)  THERAPY DIAG:  Hemiplegia and hemiparesis following cerebral infarction affecting right dominant side (HCC)  Other lack of coordination  Muscle weakness (generalized)  Other disturbances of skin sensation  Acute pain of right shoulder  Localized edema  Rationale for Evaluation and Treatment: Rehabilitation  SUBJECTIVE:   SUBJECTIVE STATEMENT: The tape irritated my skin so I don't want to do that again. The doctor changed my blood pressure medicine yesterday and put me on a fluid pill to help with my swelling. Pt accompanied by: self  PERTINENT HISTORY: admitted to [facility] on 06/05/23 with 1 week hx of  worsening RUE weakness, while in ED pt developed R facial droop and slurry speech. MRI  demonstrated L frontal and L occipital infarcts. PMH of PE, CKD,DM II, HTN.   PRECAUTIONS: Other: heart monitor and no heavy lifting  WEIGHT BEARING RESTRICTIONS: No  PAIN:  Are you having pain? Rt shoulder 3-8/10 - worse when lifting arm  FALLS: Has patient fallen in last 6 months? Yes. Number of falls 1  LIVING ENVIRONMENT: Lives with: lives with their family Lives in: 1 story house with 3 steps to enter Has following equipment at home: Single point cane  PLOF: Independent, working full time as Mining engineer - requires lifting/pulling, but also typing  PATIENT GOALS: use of dominant arm and hand for self care and work  OBJECTIVE:  Note: Objective measures were completed at Evaluation unless otherwise noted.  HAND DOMINANCE: Right  ADLs: Overall ADLs: mod I to min assist Transfers/ambulation related to ADLs: independent Eating: eating with Lt non dominant hand with difficulty Grooming: with Lt non dominant hand with difficulty UB Dressing: mod I with difficulty - pull over shirts only LB Dressing: assist w/ Rt shoe and sock, wearing elastic pants b/c he can't do buttons Toileting: mod I  Bathing: mod I w/ LH sponge Tub Shower transfers: mod I  Equipment: Long handled sponge and hand held shower  IADLs: Shopping: mod I  Light housekeeping: unable to fold towels or anything that requires 2 hands Meal Prep: dependent currently (pt loves to cook and  did mostly prior to CVA)  Community mobility: pt stills driving Medication management: independent Handwriting: unable Rt dominant hand, practicing w/ Lt hand  MOBILITY STATUS: Independent   FUNCTIONAL OUTCOME MEASURES: Upper Extremity Functional Scale (UEFS): 2/80 = .025 which translates to 99.075% disability RT dominant UE  UPPER EXTREMITY ROM:  minimal shoulder movement dominated by synergy pattern and no distal control. Approx 40%  elbow flex, no supination, minimal trace wrist extension and 10% finger flex/ext   UPPER EXTREMITY MMT:   NOT TESTED D/T LIMITED MOVEMENT   HAND FUNCTION: No grip strength  COORDINATION: Pt can pick up block w/ difficulty if block is stabilized but cannot maintain 07/25/23: RUE box & blocks = 22   SENSATION: Light touch/localization intact, but still has numbness/tingling  EDEMA: min to mod Rt hand  MUSCLE TONE: RUE: Hypotonic  COGNITION: Overall cognitive status: Within functional limits for tasks assessed  VISION: Subjective report: At first my vision was blurry and I was seeing spots, but it's getting better. Denies diplopia Baseline vision: No visual deficits Visual history: none  VISION ASSESSMENT: Will further assess in functional context     PERCEPTION: Not tested  PRAXIS: Not tested  OBSERVATIONS: Pt with shoulder pain and movement emerging RUE although limited and dominated by synergy pattern                                                                                                                             TREATMENT DATE: 07/25/23   BP = 131/87, HR = 83  Pt with some skin irritation with K-tape therefore does not wish to do again.   Pt still with stiffness Rt hand and difficulty with composite flexion, therefore wrapped hand in composite flexion using 5 width coban and placed in fluidotherapy x 10 minutes w/ hand wrapped to decrease stiffness and hopefully increase ROM.   Pt picking up and stacking small cones, placing cones on higher surface for functional repetitive reaching and grasp/release.   Pt issued putty HEP w/ yellow putty - see pt instructions for details. Pt return demo  Pt placing checkers in Connect 4 slots RUE for repetitive force used.   Box & Blocks Rt = 22   PATIENT EDUCATION: Education details: see above Person educated: Patient Education method: Explanation, Demonstration, Verbal cues, and Handouts Education  comprehension: verbalized understanding, returned demonstration, verbal cues required, and needs further education  HOME EXERCISE PROGRAM: 06/20/23: initial HEP for RUE/shoulder 07/09/23: Additional HEP for forearm, wrist, hand ROM and functional use/activities  of RUE 07/11/23: Additional HEP for RUE shoulder ROM   GOALS: Goals reviewed with patient? Yes  SHORT TERM GOALS: Target date: 07/20/23  Independent with initial HEP for RUE Baseline: Goal status: MET  2.  Pt to demo low level reaching RUE (approx 45* sh flex) in prep for functional retrieving of items from lower surfaces Baseline:  Goal status: IN PROGRESS  3.  Pt to demo 50% or greater gross composite flexion  and extension Rt hand to grasp/release 1-2 cylindrical objects Baseline:  Goal status: IN PROGRESS  4.  Pt to begin using RUE consistently as min assist for BADLS and bilateral tasks Baseline:  Goal status: IN PROGRESS  5.  Pt to don Rt shoe and sock I'ly consistently with A/E prn Baseline:  Goal status: MET (Except laces)   6.  Pt to improve RUE function as evidenced by performing 27 blocks on Box & Blocks test Baseline: unable, 07/25/23: 22 blocks Goal status: REVISED   LONG TERM GOALS: Target date: 09/18/23  Independent with updated HEP  Baseline:  Goal status: INITIAL  2.  Pt to perform mid level reaching to 90* sh flexion to retrieve light weight objects Baseline:  Goal status: INITIAL  3.  Pt to demo RUE function improvements as evidenced by performing 38 blocks or greater on Box & Blocks test Baseline:  Goal status: REVISED  4.  Pt to be perform cooking tasks safely with min assist or less using A/E prn Baseline:  Goal status: INITIAL  5.  Pt to demo 20 lbs or greater grip strength Rt hand in order to sufficiently hold objects in Rt hand Baseline: 0 lbs Goal status: INITIAL  6.  Pt to demo sufficient coordination for typing 18 wpm at 90% accuracy or verbalize understanding of voice  recognition software for work related tasks Baseline:  Goal status: INITIAL  7.  UEFI scale to improve by demo 70% or less disability RUE Baseline: 99.075% Goal status: INITIAL   ASSESSMENT:  CLINICAL IMPRESSION: Patient seen today for occupational therapy treatment for CVA w/ Rt dominant side hemiplegia. Hx includes HTN, DM, CKD, PE. Focus today on RT hand motion, strength, and RUE use. Patient very motivated to return to PLOF. Pt progressing towards STG's. Currently presents below baseline level of functioning demonstrating functional deficits and impairments as noted below. Pt would benefit from continued skilled OT services in the outpatient setting to work on impairments as noted below to help pt return to PLOF as able.   SABRA   PERFORMANCE DEFICITS: in functional skills including ADLs, IADLs, coordination, dexterity, proprioception, sensation, edema, tone, ROM, strength, pain, flexibility, Fine motor control, Gross motor control, mobility, balance, body mechanics, endurance, cardiopulmonary status limiting function, decreased knowledge of use of DME, vision, and UE functional use, cognitive skills including safety awareness, and psychosocial skills including coping strategies.   IMPAIRMENTS: are limiting patient from ADLs, IADLs, rest and sleep, work, leisure, and social participation.   CO-MORBIDITIES: has co-morbidities such as uncontrolled HTN that affects occupational performance. Patient will benefit from skilled OT to address above impairments and improve overall function.  MODIFICATION OR ASSISTANCE TO COMPLETE EVALUATION: No modification of tasks or assist necessary to complete an evaluation.  OT OCCUPATIONAL PROFILE AND HISTORY: Detailed assessment: Review of records and additional review of physical, cognitive, psychosocial history related to current functional performance.  CLINICAL DECISION MAKING: Moderate - several treatment options, min-mod task modification  necessary  REHAB POTENTIAL: Good  EVALUATION COMPLEXITY: Moderate    PLAN:  OT FREQUENCY: 2x/week  OT DURATION: 12 weeks  PLANNED INTERVENTIONS: 97535 self care/ADL training, 02889 therapeutic exercise, 97530 therapeutic activity, 97112 neuromuscular re-education, 97140 manual therapy, 97113 aquatic therapy, 97035 ultrasound, 97018 paraffin, 02960 fluidotherapy, 97010 moist heat, 97032 electrical stimulation (manual), 97014 electrical stimulation unattended, 97760 Orthotic Initial, 97763 Orthotic/Prosthetic subsequent, passive range of motion, functional mobility training, visual/perceptual remediation/compensation, energy conservation, coping strategies training, patient/family education, and DME and/or AE instructions  RECOMMENDED OTHER SERVICES:  None at this time  CONSULTED AND AGREED WITH PLAN OF CARE: Patient  PLAN FOR NEXT SESSION: monitor BP,  ? Wrapping hand w/ fluido again, UBE, assess STG's   Burnard JINNY Roads, OT 07/25/2023, 10:27 AM

## 2023-07-30 ENCOUNTER — Ambulatory Visit: Admitting: Occupational Therapy

## 2023-07-30 ENCOUNTER — Encounter: Payer: Self-pay | Admitting: Occupational Therapy

## 2023-07-30 DIAGNOSIS — R208 Other disturbances of skin sensation: Secondary | ICD-10-CM

## 2023-07-30 DIAGNOSIS — M25511 Pain in right shoulder: Secondary | ICD-10-CM

## 2023-07-30 DIAGNOSIS — R600 Localized edema: Secondary | ICD-10-CM

## 2023-07-30 DIAGNOSIS — R278 Other lack of coordination: Secondary | ICD-10-CM

## 2023-07-30 DIAGNOSIS — I69351 Hemiplegia and hemiparesis following cerebral infarction affecting right dominant side: Secondary | ICD-10-CM | POA: Diagnosis not present

## 2023-07-30 DIAGNOSIS — M6281 Muscle weakness (generalized): Secondary | ICD-10-CM

## 2023-07-30 NOTE — Therapy (Signed)
 OUTPATIENT OCCUPATIONAL THERAPY NEURO TREATMENT   Patient Name: Edward Cabrera MRN: [id number] DOB:05/06/1960, 63 y.o., male Today's Date: 07/30/2023  PCP: Celestia Rosaline SQUIBB, NP  REFERRING PROVIDER: Darci Pore, MD  END OF SESSION:  OT End of Session - 07/30/23 1019     Visit Number 8    Number of Visits 24    Date for OT Re-Evaluation 09/18/23    Authorization Type [employer], VL: 60 PT/OT/ST    OT Start Time 1015    OT Stop Time 1115    OT Time Calculation (min) 60 min    Activity Tolerance Patient tolerated treatment well    Behavior During Therapy WFL for tasks assessed/performed          Past Medical History:  Diagnosis Date   Diabetes mellitus without complication (HCC)    Gout    Hypertension    Pulmonary embolism (HCC)    Past Surgical History:  Procedure Laterality Date   ROTATOR CUFF REPAIR Left    Patient Active Problem List   Diagnosis Date Noted   Uncontrolled type 2 diabetes mellitus with hyperglycemia, without long-term current use of insulin  (HCC) 06/07/2023   Essential hypertension 06/07/2023   Hyperlipidemia 06/07/2023   Obesity (BMI 30.0-34.9) 06/07/2023   Acute CVA (cerebrovascular accident) (HCC) 06/05/2023    ONSET DATE: 06/07/2023 (referral date)   REFERRING DIAG: I63.9 (ICD-10-CM) - Acute CVA (cerebrovascular accident) (HCC)  THERAPY DIAG:  Hemiplegia and hemiparesis following cerebral infarction affecting right dominant side (HCC)  Other lack of coordination  Muscle weakness (generalized)  Other disturbances of skin sensation  Acute pain of right shoulder  Localized edema  Rationale for Evaluation and Treatment: Rehabilitation  SUBJECTIVE:   SUBJECTIVE STATEMENT: My shoulder still bothers me especially first thing in the morning. The putty is helping but I still can't fully close my hand Pt accompanied by: self  PERTINENT HISTORY: admitted to [facility] on 06/05/23 with 1 week hx of worsening RUE weakness, while in ED pt  developed R facial droop and slurry speech. MRI  demonstrated L frontal and L occipital infarcts. PMH of PE, CKD,DM II, HTN.   PRECAUTIONS: Other: heart monitor and no heavy lifting  WEIGHT BEARING RESTRICTIONS: No  PAIN:  Are you having pain? Rt shoulder 3-8/10 - worse when lifting arm  FALLS: Has patient fallen in last 6 months? Yes. Number of falls 1  LIVING ENVIRONMENT: Lives with: lives with their family Lives in: 1 story house with 3 steps to enter Has following equipment at home: Single point cane  PLOF: Independent, working full time as Mining engineer - requires lifting/pulling, but also typing  PATIENT GOALS: use of dominant arm and hand for self care and work  OBJECTIVE:  Note: Objective measures were completed at Evaluation unless otherwise noted.  HAND DOMINANCE: Right  ADLs: Overall ADLs: mod I to min assist Transfers/ambulation related to ADLs: independent Eating: eating with Lt non dominant hand with difficulty Grooming: with Lt non dominant hand with difficulty UB Dressing: mod I with difficulty - pull over shirts only LB Dressing: assist w/ Rt shoe and sock, wearing elastic pants b/c he can't do buttons Toileting: mod I  Bathing: mod I w/ LH sponge Tub Shower transfers: mod I  Equipment: Long handled sponge and hand held shower  IADLs: Shopping: mod I  Light housekeeping: unable to fold towels or anything that requires 2 hands Meal Prep: dependent currently (pt loves to cook and did mostly prior to CVA)  Community mobility: pt stills  driving Medication management: independent Handwriting: unable Rt dominant hand, practicing w/ Lt hand  MOBILITY STATUS: Independent   FUNCTIONAL OUTCOME MEASURES: Upper Extremity Functional Scale (UEFS): 2/80 = .025 which translates to 99.075% disability RT dominant UE  UPPER EXTREMITY ROM:  minimal shoulder movement dominated by synergy pattern and no distal control. Approx 40% elbow flex, no supination, minimal  trace wrist extension and 10% finger flex/ext   UPPER EXTREMITY MMT:   NOT TESTED D/T LIMITED MOVEMENT   HAND FUNCTION: No grip strength  COORDINATION: Pt can pick up block w/ difficulty if block is stabilized but cannot maintain 07/25/23: RUE box & blocks = 22   SENSATION: Light touch/localization intact, but still has numbness/tingling  EDEMA: min to mod Rt hand  MUSCLE TONE: RUE: Hypotonic  COGNITION: Overall cognitive status: Within functional limits for tasks assessed  VISION: Subjective report: At first my vision was blurry and I was seeing spots, but it's getting better. Denies diplopia Baseline vision: No visual deficits Visual history: none  VISION ASSESSMENT: Will further assess in functional context     PERCEPTION: Not tested  PRAXIS: Not tested  OBSERVATIONS: Pt with shoulder pain and movement emerging RUE although limited and dominated by synergy pattern                                                                                                                             TREATMENT DATE: 07/30/23  Fluidotherapy x 12 minutes for Rt hand to address pain, swelling, and stiffness. No adverse reactions.  Pt performing tendon gliding ex's while in fluido  BP = 142/107, HR = 82 (while in fluidotherapy)   Pt shown flexion glove to improve gross composite flexion and provided handout and how to purchase  Assessed remaining STG's - see below  Practiced simulated eating w/ spoon and focused on forearm supination w/ elbow flexion Practiced writing name and tracing name in print. Practiced tracing shapes   BP = 137/100, HR = 82  Grip strength Rt = 6 lbs  (Lt = 113 lbs)   Pt issued handouts for finger tapping ex's, Rt handed typing words, and tracing activities (shapes and letters)   Pt also limited in wrist extension and unable to do wall push ups, therefore, pt issued HEP for wrist extension stretch and wt bearing ex - see pt instructions for  details  UBE x 5 min, level 1 for normal reciprocal movement pattern/RUE retraining and UB conditioning    PATIENT EDUCATION: Education details: see above Person educated: Patient Education method: Explanation, Demonstration, Verbal cues, and Handouts Education comprehension: verbalized understanding, returned demonstration, verbal cues required, and needs further education  HOME EXERCISE PROGRAM: 06/20/23: initial HEP for RUE/shoulder 07/09/23: Additional HEP for forearm, wrist, hand ROM and functional use/activities  of RUE 07/11/23: Additional HEP for RUE shoulder ROM 07/16/23: contrast bath 07/18/23: prone scapula HEP  07/25/23: finger tapping ex's, Rt handed typing words, tracing activities, wrist ext stretch HEP  GOALS: Goals reviewed with patient? Yes  SHORT TERM GOALS: Target date: 07/20/23  Independent with initial HEP for RUE Baseline: Goal status: MET  2.  Pt to demo low level reaching RUE (approx 45* sh flex) in prep for functional retrieving of items from lower surfaces Baseline:  Goal status: MET (b/t 60-70*)   3.  Pt to demo 50% or greater gross composite flexion and extension Rt hand to grasp/release 1-2 cylindrical objects Baseline:  Goal status: MET   4.  Pt to begin using RUE consistently as min assist for BADLS and bilateral tasks Baseline:  Goal status: MET  5.  Pt to don Rt shoe and sock I'ly consistently with A/E prn Baseline:  Goal status: MET (Except laces)   6.  Pt to improve RUE function as evidenced by performing 27 blocks on Box & Blocks test Baseline: unable, 07/25/23: 22 blocks Goal status: REVISED   LONG TERM GOALS: Target date: 09/18/23  Independent with updated HEP  Baseline:  Goal status: INITIAL  2.  Pt to perform mid level reaching to 90* sh flexion to retrieve light weight objects Baseline:  Goal status: INITIAL  3.  Pt to demo RUE function improvements as evidenced by performing 38 blocks or greater on Box & Blocks  test Baseline:  Goal status: REVISED  4.  Pt to be perform cooking tasks safely with min assist or less using A/E prn Baseline:  Goal status: INITIAL  5.  Pt to demo 20 lbs or greater grip strength Rt hand in order to sufficiently hold objects in Rt hand Baseline: 0 lbs Goal status: INITIAL  6.  Pt to demo sufficient coordination for typing 18 wpm at 90% accuracy or verbalize understanding of voice recognition software for work related tasks Baseline:  Goal status: INITIAL  7.  UEFI scale to improve by demo 70% or less disability RUE Baseline: 99.075% Goal status: INITIAL   ASSESSMENT:  CLINICAL IMPRESSION: Patient seen today for occupational therapy treatment for CVA w/ Rt dominant side hemiplegia. Hx includes HTN, DM, CKD, PE. Focus today on RT hand motion, strength, and RUE use. Patient very motivated to return to PLOF. Pt has met all original STG's and updated/revised STG #6. Pt continues to demo progress and will continue to benefit from skilled O.T. to progress towards LTG's   .   PERFORMANCE DEFICITS: in functional skills including ADLs, IADLs, coordination, dexterity, proprioception, sensation, edema, tone, ROM, strength, pain, flexibility, Fine motor control, Gross motor control, mobility, balance, body mechanics, endurance, cardiopulmonary status limiting function, decreased knowledge of use of DME, vision, and UE functional use, cognitive skills including safety awareness, and psychosocial skills including coping strategies.   IMPAIRMENTS: are limiting patient from ADLs, IADLs, rest and sleep, work, leisure, and social participation.   CO-MORBIDITIES: has co-morbidities such as uncontrolled HTN that affects occupational performance. Patient will benefit from skilled OT to address above impairments and improve overall function.  MODIFICATION OR ASSISTANCE TO COMPLETE EVALUATION: No modification of tasks or assist necessary to complete an evaluation.  OT OCCUPATIONAL  PROFILE AND HISTORY: Detailed assessment: Review of records and additional review of physical, cognitive, psychosocial history related to current functional performance.  CLINICAL DECISION MAKING: Moderate - several treatment options, min-mod task modification necessary  REHAB POTENTIAL: Good  EVALUATION COMPLEXITY: Moderate    PLAN:  OT FREQUENCY: 2x/week  OT DURATION: 12 weeks  PLANNED INTERVENTIONS: 97535 self care/ADL training, 02889 therapeutic exercise, 97530 therapeutic activity, 97112 neuromuscular re-education, 97140 manual therapy, [id number] aquatic therapy,  02964 ultrasound, 02981 paraffin, 97039 fluidotherapy, 97010 moist heat, 97032 electrical stimulation (manual), 97014 electrical stimulation unattended, 97760 Orthotic Initial, 97763 Orthotic/Prosthetic subsequent, passive range of motion, functional mobility training, visual/perceptual remediation/compensation, energy conservation, coping strategies training, patient/family education, and DME and/or AE instructions  RECOMMENDED OTHER SERVICES: None at this time  CONSULTED AND AGREED WITH PLAN OF CARE: Patient  PLAN FOR NEXT SESSION: monitor BP,  fluido, UBE, follow up on wrist ex's, continue NMR and functional use RUE   Burnard JINNY Roads, OT 07/30/2023, 10:20 AM

## 2023-07-30 NOTE — Patient Instructions (Addendum)
 Composite Extension (Passive Flexor Stretch)    Sitting with elbows on table and palms together, slowly lower wrists toward table until stretch is felt. Be sure to keep palms together throughout stretch. Hold __10__ seconds. Relax. Repeat __5__ times. Do __3__ sessions per day.   Weight Bearing (Standing)    Rest palms comfortably on table, gently lean forward over hands, then staying forward move side to side.  Hold __5__ seconds to Rt side. Keep elbow straight Repeat _10___ times. Do __2__ sessions per day.

## 2023-08-01 ENCOUNTER — Ambulatory Visit: Admitting: Occupational Therapy

## 2023-08-06 ENCOUNTER — Encounter: Payer: Self-pay | Admitting: Occupational Therapy

## 2023-08-06 ENCOUNTER — Ambulatory Visit: Attending: Internal Medicine | Admitting: Occupational Therapy

## 2023-08-06 DIAGNOSIS — R2681 Unsteadiness on feet: Secondary | ICD-10-CM | POA: Insufficient documentation

## 2023-08-06 DIAGNOSIS — I69351 Hemiplegia and hemiparesis following cerebral infarction affecting right dominant side: Secondary | ICD-10-CM | POA: Diagnosis present

## 2023-08-06 DIAGNOSIS — M6281 Muscle weakness (generalized): Secondary | ICD-10-CM | POA: Diagnosis present

## 2023-08-06 DIAGNOSIS — R208 Other disturbances of skin sensation: Secondary | ICD-10-CM | POA: Diagnosis present

## 2023-08-06 DIAGNOSIS — R278 Other lack of coordination: Secondary | ICD-10-CM | POA: Insufficient documentation

## 2023-08-06 DIAGNOSIS — R600 Localized edema: Secondary | ICD-10-CM | POA: Insufficient documentation

## 2023-08-06 DIAGNOSIS — M25511 Pain in right shoulder: Secondary | ICD-10-CM | POA: Insufficient documentation

## 2023-08-06 NOTE — Progress Notes (Unsigned)
 Cardiology Office Note   Date:  08/09/2023   ID:  Red, Mandt [birth year], MRN [id number]  PCP:  Celestia Rosaline SQUIBB, NP  Cardiologist:   None Referring:  Celestia Rosaline SQUIBB, NP  Chief Complaint  Patient presents with   LVH      History of Present Illness: Edward Cabrera is a 63 y.o. male who presents for follow up of a CVA.  This is my first appointment with him.  He is referred by Celestia Rosaline SQUIBB, NP.  He was hospitalized in June 2025 with a CVA.  MRI demonstrated left posterior frontal and left occipital infarcts.  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  He was managed with aspirin  and Plavix .  He had poorly controlled diabetes.  Echocardiogram demonstrated a mildly reduced ejection fraction 45 to 50%.  There is moderate concentric left ventricular hypertrophy.  An event monitor demonstrated no atrial fibrillation and no significant arrhythmias.  He said that he has felt OK since the stroke as he works with OT.  He is gaining some function back of his right hand but still has weakness.  He has swelling in his right arm that he says has been persistent and he is also having some pain up in the shoulder and the right arm.  He did not have this before.  He denies any palpitations, presyncope or syncope.  He denies any chest pressure, neck or arm discomfort.  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  He did not have any cardiovascular symptoms prior to this.  She has had longstanding high blood pressure but he really has not taken his blood pressure routinely to know whether it was manage.  He has an A1c greater than 9 and needs trying to minimize his diabetes medicines.  He is newly on lipid medicines and has had untreated dyslipidemia.   Past Medical History:  Diagnosis Date   Diabetes mellitus without complication (HCC)    Gout    Hypertension    Pulmonary  embolism (HCC)     Past Surgical History:  Procedure Laterality Date   ROTATOR CUFF REPAIR Left      Current Outpatient Medications  Medication Sig Dispense Refill   amLODipine  (NORVASC ) 10 MG tablet Take 1 tablet (10 mg total) by mouth daily. 90 tablet 1   aspirin  EC 81 MG tablet Take 1 tablet (81 mg total) by mouth daily. Swallow whole. 30 tablet 12   atorvastatin  (LIPITOR ) 80 MG tablet Take 1 tablet (80 mg total) by mouth daily. 90 tablet 1   Blood Glucose Monitoring Suppl DEVI 1 each by Does not apply route in the morning, at noon, and at bedtime. May substitute to any manufacturer covered by patient's insurance. 1 each 0   clopidogrel  (PLAVIX ) 75 MG tablet Take 1 tablet (75 mg total) by mouth daily. 90 tablet 0   glimepiride  (AMARYL ) 2 MG tablet Take 1 tablet (2 mg total) by mouth every morning. 90 tablet 1   Omega-3 Fatty Acids (ULTRA OMEGA-3 FISH OIL ) 1400 MG CAPS Take 2,800 mg by mouth daily.     valsartan -hydrochlorothiazide  (DIOVAN -HCT) 160-25 MG tablet Take 1 tablet by mouth daily. 90 tablet 1   No current facility-administered medications for this visit.    Allergies:   Iohexol    Social History:  The patient  reports that he  has never smoked. He has never used smokeless tobacco. He reports that he does not currently use alcohol. He reports current drug use. Drug: Marijuana.   Family History:  The patient's family history includes Diabetes in an other family member; Healthy in his mother; Kidney failure (age of onset: 66) in his father.    ROS:  Please see the history of present illness.   Otherwise, review of systems are positive for none.   All other systems are reviewed and negative.    PHYSICAL EXAM: VS:  BP 128/80 (BP Location: Left Arm, Patient Position: Sitting, Cuff Size: Normal)   Pulse (!) 104   Ht 5' 8 (1.727 m)   Wt 222 lb 9.6 oz (101 kg)   SpO2 96%   BMI 33.85 kg/m  , BMI Body mass index is 33.85 kg/m. GENERAL:  Well appearing HEENT:  Pupils equal  round and reactive, fundi not visualized, oral mucosa unremarkable NECK:  No jugular venous distention, waveform within normal limits, carotid upstroke brisk and symmetric, no bruits, no thyromegaly LYMPHATICS:  No cervical, inguinal adenopathy LUNGS:  Clear to auscultation bilaterally BACK:  No CVA tenderness CHEST:  Unremarkable HEART:  PMI not displaced or sustained,S1 and S2 within normal limits, no S3, no S4, no clicks, no rubs, no murmurs ABD:  Flat, positive bowel sounds normal in frequency in pitch, no bruits, no rebound, no guarding, no midline pulsatile mass, no hepatomegaly, no splenomegaly EXT:  2 plus pulses throughout, no edema, no cyanosis no clubbing SKIN:  No rashes no nodules NEURO:  Cranial nerves II through XII grossly intact, motor grossly intact throughout PSYCH:  Cognitively intact, oriented to person place and time    EKG:        Recent Labs: 06/06/2023: Hemoglobin 15.6; Magnesium 2.0; Platelets 189 06/29/2023: ALT 30; BUN 15; Creatinine, Ser 1.45; Potassium 4.4; Sodium 140    Lipid Panel    Component Value Date/Time   CHOL 224 (H) 06/07/2023 1024   CHOL 229 (H) 05/21/2023 1452   TRIG 120 06/07/2023 1024   HDL 32 (L) 06/07/2023 1024   HDL 38 (L) 05/21/2023 1452   CHOLHDL 7.0 06/07/2023 1024   VLDL 24 06/07/2023 1024   LDLCALC 168 (H) 06/07/2023 1024   LDLCALC 164 (H) 05/21/2023 1452      Wt Readings from Last 3 Encounters:  08/09/23 222 lb 9.6 oz (101 kg)  06/13/23 214 lb 12.8 oz (97.4 kg)  06/05/23 230 lb (104.3 kg)      Other studies Reviewed: Additional studies/ records that were reviewed today include: Labs, hospital records. Review of the above records demonstrates:  Please see elsewhere in the note.     ASSESSMENT AND PLAN:  CVA: This seems to be related to vascular disease secondary to his diabetes, dyslipidemia and hypertension.  There is no suggestion of dysrhythmia.  It does not appear to be embolic.  He will continue with risk  reduction.  A arm swelling: This is probably vasogenic edema but given the pain and the continued swelling I will check upper arm Doppler to rule out DVT.  Diabetes mellitus: He does not want to take other therapies and he understands that his diabetes is a significant risk factor for future events.  The A1c was 9.5.  Hypertension: The blood pressure is currently controlled.  Continue current therapy.  Left ventricular hypertrophy: For now we will continue the meds as listed and I will follow-up with an echo in the future and do further assessment if  his EF continues to be reduced and I purge if he still present.  I suspect the LVH is related to uncontrolled hypertension but will consider other sources.  Cardiomyopathy: He is euvolemic.  No change in therapy.  See above.   Current medicines are reviewed at length with the patient today.  The patient does not have concerns regarding medicines.  The following changes have been made:  no change  Labs/ tests ordered today include:   Orders Placed This Encounter  Procedures   EKG 12-Lead   VAS US  UPPER EXTREMITY ARTERIAL DUPLEX    Disposition:   FU with with me in 6 months from [facility].   Signed, Lynwood Schilling, MD  08/09/2023 10:04 AM    [facility]

## 2023-08-06 NOTE — Therapy (Signed)
 OUTPATIENT OCCUPATIONAL THERAPY NEURO TREATMENT   Patient Name: Edward Cabrera MRN: [id number] DOB:[birth year], 63 y.o., male Today's Date: 08/06/2023  PCP: Celestia Rosaline SQUIBB, NP  REFERRING PROVIDER: Darci Pore, MD  END OF SESSION:  OT End of Session - 08/06/23 1021     Visit Number 9    Number of Visits 24    Date for OT Re-Evaluation 09/18/23    Authorization Type U[employer], VL: 60 PT/OT/ST    OT Start Time 1015    OT Stop Time 1100    OT Time Calculation (min) 45 min    Activity Tolerance Patient tolerated treatment well    Behavior During Therapy WFL for tasks assessed/performed          Past Medical History:  Diagnosis Date   Diabetes mellitus without complication (HCC)    Gout    Hypertension    Pulmonary embolism (HCC)    Past Surgical History:  Procedure Laterality Date   ROTATOR CUFF REPAIR Left    Patient Active Problem List   Diagnosis Date Noted   Uncontrolled type 2 diabetes mellitus with hyperglycemia, without long-term current use of insulin  (HCC) 06/07/2023   Essential hypertension 06/07/2023   Hyperlipidemia 06/07/2023   Obesity (BMI 30.0-34.9) 06/07/2023   Acute CVA (cerebrovascular accident) (HCC) 06/05/2023    ONSET DATE: 06/07/2023 (referral date)   REFERRING DIAG: I63.9 (ICD-10-CM) - Acute CVA (cerebrovascular accident) (HCC)  THERAPY DIAG:  Hemiplegia and hemiparesis following cerebral infarction affecting right dominant side (HCC)  Other lack of coordination  Muscle weakness (generalized)  Other disturbances of skin sensation  Acute pain of right shoulder  Localized edema  Rationale for Evaluation and Treatment: Rehabilitation  SUBJECTIVE:   SUBJECTIVE STATEMENT: I am probably eating 25% with my Rt hand. No pain now but it can go up to 6-8/10 Rt hand, wrist and up arm - throbbing pain Pt accompanied by: self  PERTINENT HISTORY: admitted to [facility] on 06/05/23 with 1 week hx of worsening RUE weakness, while in ED pt  developed R facial droop and slurry speech. MRI  demonstrated L frontal and L occipital infarcts. PMH of PE, CKD,DM II, HTN.   PRECAUTIONS: Other: heart monitor and no heavy lifting  WEIGHT BEARING RESTRICTIONS: No  PAIN:  Are you having pain? Rt shoulder and hand/wrist 0-8/10 - worse when lifting arm  FALLS: Has patient fallen in last 6 months? Yes. Number of falls 1  LIVING ENVIRONMENT: Lives with: lives with their family Lives in: 1 story house with 3 steps to enter Has following equipment at home: Single point cane  PLOF: Independent, working full time as Mining engineer - requires lifting/pulling, but also typing  PATIENT GOALS: use of dominant arm and hand for self care and work  OBJECTIVE:  Note: Objective measures were completed at Evaluation unless otherwise noted.  HAND DOMINANCE: Right  ADLs: Overall ADLs: mod I to min assist Transfers/ambulation related to ADLs: independent Eating: eating with Lt non dominant hand with difficulty Grooming: with Lt non dominant hand with difficulty UB Dressing: mod I with difficulty - pull over shirts only LB Dressing: assist w/ Rt shoe and sock, wearing elastic pants b/c he can't do buttons Toileting: mod I  Bathing: mod I w/ LH sponge Tub Shower transfers: mod I  Equipment: Long handled sponge and hand held shower  IADLs: Shopping: mod I  Light housekeeping: unable to fold towels or anything that requires 2 hands Meal Prep: dependent currently (pt loves to cook and did mostly prior  to CVA)  Community mobility: pt stills driving Medication management: independent Handwriting: unable Rt dominant hand, practicing w/ Lt hand  MOBILITY STATUS: Independent   FUNCTIONAL OUTCOME MEASURES: Upper Extremity Functional Scale (UEFS): 2/80 = .025 which translates to 99.075% disability RT dominant UE  UPPER EXTREMITY ROM:  minimal shoulder movement dominated by synergy pattern and no distal control. Approx 40% elbow flex, no  supination, minimal trace wrist extension and 10% finger flex/ext   UPPER EXTREMITY MMT:   NOT TESTED D/T LIMITED MOVEMENT   HAND FUNCTION: No grip strength  COORDINATION: Pt can pick up block w/ difficulty if block is stabilized but cannot maintain 07/25/23: RUE box & blocks = 22   SENSATION: Light touch/localization intact, but still has numbness/tingling  EDEMA: min to mod Rt hand  MUSCLE TONE: RUE: Hypotonic  COGNITION: Overall cognitive status: Within functional limits for tasks assessed  VISION: Subjective report: At first my vision was blurry and I was seeing spots, but it's getting better. Denies diplopia Baseline vision: No visual deficits Visual history: none  VISION ASSESSMENT: Will further assess in functional context     PERCEPTION: Not tested  PRAXIS: Not tested  OBSERVATIONS: Pt with shoulder pain and movement emerging RUE although limited and dominated by synergy pattern                                                                                                                             TREATMENT DATE: 08/06/23  Pt reports eating 25% with Rt dominant hand   Fluidotherapy x 12 minutes for Rt hand to address pain, swelling, and stiffness. No adverse reactions.  Pt performing tendon gliding ex's while in fluido. Assessed BP while in fluido - see below  BP = 132/100  Reviewed HEP for wrist extension stretch and wt bearing ex issued last session. Pt has pain with full composite extension of wrist and digits but pain decreases when allowing fingers to flex with wrist ext. Pt instructed in modified wrist stretching ex (w/ fingers flexed) prior to prayer stretch.   Pt with Rt shoulder pain during mid to higher range active open chain movement:  P/ROM in RUE sh flexion to higher range - pt did not have pain until very high range.   Reviewed 2 ex's:  1) holding boomwhacker vertically and performing AA/ROM in sh flexion/ext with LUE guiding/assisting  RUE w/ decreased pain. Focus on correct movement patterns  2) Supine: reviewed BUE shoulder flex holding paper towel roll with palms facing.   Pt also advised to continue prone ex's for posterior sh girdle strengthening  Advised pt to keep RUE lower for open chain functional reaching as pt cannot control correct shoulder position mid to high range which causes pain   Ultrasound x 8 minutes, 3 Mhz, 1.0 wts/cm2, continuous over Rt biceps proximal attachment and middle deltoid for pain management  Advised pt to seek orthopedic MD to assess shoulder for possible rotator cuff tear  PATIENT EDUCATION: Education details: see above Person educated: Patient Education method: Explanation, Demonstration, Verbal cues, and Handouts Education comprehension: verbalized understanding, returned demonstration, verbal cues required, and needs further education  HOME EXERCISE PROGRAM: 06/20/23: initial HEP for RUE/shoulder 07/09/23: Additional HEP for forearm, wrist, hand ROM and functional use/activities  of RUE 07/11/23: Additional HEP for RUE shoulder ROM 07/16/23: contrast bath 07/18/23: prone scapula HEP  07/25/23: finger tapping ex's, Rt handed typing words, tracing activities, wrist ext stretch HEP    GOALS: Goals reviewed with patient? Yes  SHORT TERM GOALS: Target date: 07/20/23  Independent with initial HEP for RUE Baseline: Goal status: MET  2.  Pt to demo low level reaching RUE (approx 45* sh flex) in prep for functional retrieving of items from lower surfaces Baseline:  Goal status: MET (b/t 60-70*)   3.  Pt to demo 50% or greater gross composite flexion and extension Rt hand to grasp/release 1-2 cylindrical objects Baseline:  Goal status: MET   4.  Pt to begin using RUE consistently as min assist for BADLS and bilateral tasks Baseline:  Goal status: MET  5.  Pt to don Rt shoe and sock I'ly consistently with A/E prn Baseline:  Goal status: MET (Except laces)   6.  Pt to  improve RUE function as evidenced by performing 27 blocks on Box & Blocks test Baseline: unable, 07/25/23: 22 blocks Goal status: REVISED   LONG TERM GOALS: Target date: 09/18/23  Independent with updated HEP  Baseline:  Goal status: MET  2.  Pt to perform mid level reaching to 90* sh flexion to retrieve light weight objects Baseline:  Goal status: INITIAL  3.  Pt to demo RUE function improvements as evidenced by performing 38 blocks or greater on Box & Blocks test Baseline: Eval - unable, 07/25/23 = 22 blocks Goal status: REVISED  4.  Pt to be perform cooking tasks safely with min assist or less using A/E prn Baseline:  Goal status: INITIAL  5.  Pt to demo 20 lbs or greater grip strength Rt hand in order to sufficiently hold objects in Rt hand Baseline: 0 lbs, 07/30/23 = 6 lbs Goal status: INITIAL  6.  Pt to demo sufficient coordination for typing 18 wpm at 90% accuracy or verbalize understanding of voice recognition software for work related tasks Baseline:  Goal status: INITIAL  7.  UEFI scale to improve by demo 70% or less disability RUE Baseline: 99.075% Goal status: INITIAL   ASSESSMENT:  CLINICAL IMPRESSION: Patient seen today for occupational therapy treatment for CVA w/ Rt dominant side hemiplegia. Pt limited by Rt shoulder pain, Rt wrist pain, and continued edema Rt hand. Advised pt to see orthopedist for Rt shoulder pain - ? Impingement or rotator cuff tear.  Pt continues to demo steady progress and will continue to benefit from skilled O.T. to progress towards LTG's   .   PERFORMANCE DEFICITS: in functional skills including ADLs, IADLs, coordination, dexterity, proprioception, sensation, edema, tone, ROM, strength, pain, flexibility, Fine motor control, Gross motor control, mobility, balance, body mechanics, endurance, cardiopulmonary status limiting function, decreased knowledge of use of DME, vision, and UE functional use, cognitive skills including safety  awareness, and psychosocial skills including coping strategies.   IMPAIRMENTS: are limiting patient from ADLs, IADLs, rest and sleep, work, leisure, and social participation.   CO-MORBIDITIES: has co-morbidities such as uncontrolled HTN that affects occupational performance. Patient will benefit from skilled OT to address above impairments and improve overall function.  MODIFICATION OR ASSISTANCE  TO COMPLETE EVALUATION: No modification of tasks or assist necessary to complete an evaluation.  OT OCCUPATIONAL PROFILE AND HISTORY: Detailed assessment: Review of records and additional review of physical, cognitive, psychosocial history related to current functional performance.  CLINICAL DECISION MAKING: Moderate - several treatment options, min-mod task modification necessary  REHAB POTENTIAL: Good  EVALUATION COMPLEXITY: Moderate    PLAN:  OT FREQUENCY: 2x/week  OT DURATION: 12 weeks  PLANNED INTERVENTIONS: 97535 self care/ADL training, 02889 therapeutic exercise, 97530 therapeutic activity, 97112 neuromuscular re-education, 97140 manual therapy, 97113 aquatic therapy, 97035 ultrasound, 97018 paraffin, 02960 fluidotherapy, 97010 moist heat, 97032 electrical stimulation (manual), 97014 electrical stimulation unattended, 97760 Orthotic Initial, 97763 Orthotic/Prosthetic subsequent, passive range of motion, functional mobility training, visual/perceptual remediation/compensation, energy conservation, coping strategies training, patient/family education, and DME and/or AE instructions  RECOMMENDED OTHER SERVICES: None at this time  CONSULTED AND AGREED WITH PLAN OF CARE: Patient  PLAN FOR NEXT SESSION: monitor BP,  fluido, UBE, follow up on wrist ex's, continue NMR and functional use RUE   Burnard JINNY Roads, OT 08/06/2023, 10:22 AM

## 2023-08-07 ENCOUNTER — Other Ambulatory Visit: Payer: Self-pay

## 2023-08-07 NOTE — Patient Outreach (Signed)
 LCSW called patient and scheduled time of  six weeks follow up from EMMI referral to check in on patient. Patient reports that he is doing fine and does not want to move forward with enrollment into [facility]BC[facility] services. LCSW informed him to reach out if a need arises.  Olam Ally, MSW, LCSW [facility], [facility] Licensed Clinical Social Worker Direct Dial: [phone]

## 2023-08-08 ENCOUNTER — Encounter: Payer: Self-pay | Admitting: Occupational Therapy

## 2023-08-08 ENCOUNTER — Ambulatory Visit: Admitting: Occupational Therapy

## 2023-08-08 DIAGNOSIS — I69351 Hemiplegia and hemiparesis following cerebral infarction affecting right dominant side: Secondary | ICD-10-CM

## 2023-08-08 DIAGNOSIS — R278 Other lack of coordination: Secondary | ICD-10-CM

## 2023-08-08 DIAGNOSIS — I517 Cardiomegaly: Secondary | ICD-10-CM | POA: Insufficient documentation

## 2023-08-08 DIAGNOSIS — R208 Other disturbances of skin sensation: Secondary | ICD-10-CM

## 2023-08-08 DIAGNOSIS — M25511 Pain in right shoulder: Secondary | ICD-10-CM

## 2023-08-08 DIAGNOSIS — I429 Cardiomyopathy, unspecified: Secondary | ICD-10-CM | POA: Insufficient documentation

## 2023-08-08 DIAGNOSIS — E118 Type 2 diabetes mellitus with unspecified complications: Secondary | ICD-10-CM | POA: Insufficient documentation

## 2023-08-08 DIAGNOSIS — M6281 Muscle weakness (generalized): Secondary | ICD-10-CM

## 2023-08-08 NOTE — Therapy (Signed)
 OUTPATIENT OCCUPATIONAL THERAPY NEURO TREATMENT   Patient Name: Edward Cabrera MRN: [id number] DOB:08/07/1960, 63 y.o., male Today's Date: 08/08/2023  PCP: Celestia Rosaline SQUIBB, NP  REFERRING PROVIDER: Darci Pore, MD  END OF SESSION:  OT End of Session - 08/08/23 1011     Visit Number 10    Number of Visits 24    Date for OT Re-Evaluation 09/18/23    Authorization Type UHC, VL: 60 PT/OT/ST    OT Start Time 1008    OT Stop Time 1046    OT Time Calculation (min) 38 min    Activity Tolerance Patient tolerated treatment well    Behavior During Therapy WFL for tasks assessed/performed          Past Medical History:  Diagnosis Date   Diabetes mellitus without complication (HCC)    Gout    Hypertension    Pulmonary embolism (HCC)    Past Surgical History:  Procedure Laterality Date   ROTATOR CUFF REPAIR Left    Patient Active Problem List   Diagnosis Date Noted   Type 2 diabetes mellitus with complication, without long-term current use of insulin  (HCC) 08/08/2023   LVH (left ventricular hypertrophy) 08/08/2023   Cardiomyopathy (HCC) 08/08/2023   Uncontrolled type 2 diabetes mellitus with hyperglycemia, without long-term current use of insulin  (HCC) 06/07/2023   Essential hypertension 06/07/2023   Hyperlipidemia 06/07/2023   Obesity (BMI 30.0-34.9) 06/07/2023   Acute CVA (cerebrovascular accident) (HCC) 06/05/2023    ONSET DATE: 06/07/2023 (referral date)   REFERRING DIAG: I63.9 (ICD-10-CM) - Acute CVA (cerebrovascular accident) (HCC)  THERAPY DIAG:  Hemiplegia and hemiparesis following cerebral infarction affecting right dominant side (HCC)  Other lack of coordination  Muscle weakness (generalized)  Other disturbances of skin sensation  Acute pain of right shoulder  Rationale for Evaluation and Treatment: Rehabilitation  SUBJECTIVE:   SUBJECTIVE STATEMENT: I didn't notice a huge difference with the US  for my shoulder  Pt accompanied by:  self  PERTINENT HISTORY: admitted to [facility] on 06/05/23 with 1 week hx of worsening RUE weakness, while in ED pt developed R facial droop and slurry speech. MRI  demonstrated L frontal and L occipital infarcts. PMH of PE, CKD,DM II, HTN.   PRECAUTIONS: Other: heart monitor and no heavy lifting  WEIGHT BEARING RESTRICTIONS: No  PAIN:  Are you having pain? Rt shoulder and hand/wrist 0-8/10 - worse when lifting arm  FALLS: Has patient fallen in last 6 months? Yes. Number of falls 1  LIVING ENVIRONMENT: Lives with: lives with their family Lives in: 1 story house with 3 steps to enter Has following equipment at home: Single point cane  PLOF: Independent, working full time as Mining engineer - requires lifting/pulling, but also typing  PATIENT GOALS: use of dominant arm and hand for self care and work  OBJECTIVE:  Note: Objective measures were completed at Evaluation unless otherwise noted.  HAND DOMINANCE: Right  ADLs: Overall ADLs: mod I to min assist Transfers/ambulation related to ADLs: independent Eating: eating with Lt non dominant hand with difficulty Grooming: with Lt non dominant hand with difficulty UB Dressing: mod I with difficulty - pull over shirts only LB Dressing: assist w/ Rt shoe and sock, wearing elastic pants b/c he can't do buttons Toileting: mod I  Bathing: mod I w/ LH sponge Tub Shower transfers: mod I  Equipment: Long handled sponge and hand held shower  IADLs: Shopping: mod I  Light housekeeping: unable to fold towels or anything that requires 2 hands Meal Prep:  dependent currently (pt loves to cook and did mostly prior to CVA)  Community mobility: pt stills driving Medication management: independent Handwriting: unable Rt dominant hand, practicing w/ Lt hand  MOBILITY STATUS: Independent   FUNCTIONAL OUTCOME MEASURES: Upper Extremity Functional Scale (UEFS): 2/80 = .025 which translates to 99.075% disability RT dominant UE  UPPER EXTREMITY ROM:   minimal shoulder movement dominated by synergy pattern and no distal control. Approx 40% elbow flex, no supination, minimal trace wrist extension and 10% finger flex/ext   UPPER EXTREMITY MMT:   NOT TESTED D/T LIMITED MOVEMENT   HAND FUNCTION: No grip strength  COORDINATION: Pt can pick up block w/ difficulty if block is stabilized but cannot maintain 07/25/23: RUE box & blocks = 22   SENSATION: Light touch/localization intact, but still has numbness/tingling  EDEMA: min to mod Rt hand  MUSCLE TONE: RUE: Hypotonic  COGNITION: Overall cognitive status: Within functional limits for tasks assessed  VISION: Subjective report: At first my vision was blurry and I was seeing spots, but it's getting better. Denies diplopia Baseline vision: No visual deficits Visual history: none  VISION ASSESSMENT: Will further assess in functional context     PERCEPTION: Not tested  PRAXIS: Not tested  OBSERVATIONS: Pt with shoulder pain and movement emerging RUE although limited and dominated by synergy pattern                                                                                                                             TREATMENT DATE: 08/08/23   BP = 132/100   Ultrasound x 8 minutes, 3 Mhz, 1.0 wts/cm2, 20% pulsed Rt palmer hand for edema management/swelling.   Followed by beginning in hand manipulation with checkers - manipulating up to 3 from fingertips to palm and back to fingertips with max difficulty. Pt also able to hold deck of cards and push top card off w/ thumb one at a time with mod to max difficulty  Pt issued updated coordination HEP - see pt instructions for details.   UBE x 8 min, level 1 for normal reciprocal movement pattern and UB conditioning       PATIENT EDUCATION: Education details: see above Person educated: Patient Education method: Explanation, Demonstration, Verbal cues, and Handouts Education comprehension: verbalized understanding,  returned demonstration, verbal cues required, and needs further education  HOME EXERCISE PROGRAM: 06/20/23: initial HEP for RUE/shoulder 07/09/23: Additional HEP for forearm, wrist, hand ROM and functional use/activities  of RUE 07/11/23: Additional HEP for RUE shoulder ROM 07/16/23: contrast bath 07/18/23: prone scapula HEP  07/25/23: finger tapping ex's, Rt handed typing words, tracing activities, wrist ext stretch HEP  08/08/23: updated coordination HEP    GOALS: Goals reviewed with patient? Yes  SHORT TERM GOALS: Target date: 07/20/23  Independent with initial HEP for RUE Baseline: Goal status: MET  2.  Pt to demo low level reaching RUE (approx 45* sh flex) in prep for functional retrieving of items from lower surfaces  Baseline:  Goal status: MET (b/t 60-70*)   3.  Pt to demo 50% or greater gross composite flexion and extension Rt hand to grasp/release 1-2 cylindrical objects Baseline:  Goal status: MET   4.  Pt to begin using RUE consistently as min assist for BADLS and bilateral tasks Baseline:  Goal status: MET  5.  Pt to don Rt shoe and sock I'ly consistently with A/E prn Baseline:  Goal status: MET (Except laces)   6.  Pt to improve RUE function as evidenced by performing 27 blocks on Box & Blocks test Baseline: unable, 07/25/23: 22 blocks Goal status: REVISED   LONG TERM GOALS: Target date: 09/18/23  Independent with updated HEP  Baseline:  Goal status: MET  2.  Pt to perform mid level reaching to 90* sh flexion to retrieve light weight objects Baseline:  Goal status: INITIAL  3.  Pt to demo RUE function improvements as evidenced by performing 38 blocks or greater on Box & Blocks test Baseline: Eval - unable, 07/25/23 = 22 blocks Goal status: REVISED  4.  Pt to be perform cooking tasks safely with min assist or less using A/E prn Baseline:  Goal status: INITIAL  5.  Pt to demo 20 lbs or greater grip strength Rt hand in order to sufficiently hold objects in Rt  hand Baseline: 0 lbs, 07/30/23 = 6 lbs Goal status: INITIAL  6.  Pt to demo sufficient coordination for typing 18 wpm at 90% accuracy or verbalize understanding of voice recognition software for work related tasks Baseline:  Goal status: INITIAL  7.  UEFI scale to improve by demo 70% or less disability RUE Baseline: 99.075% Goal status: INITIAL   ASSESSMENT:  CLINICAL IMPRESSION: Patient seen today for occupational therapy treatment for CVA w/ Rt dominant side hemiplegia. Pt limited by Rt shoulder pain, Rt wrist pain, and continued edema Rt hand. Advised pt to see orthopedist for Rt shoulder pain - ? Impingement or rotator cuff tear.  Pt continues to demo steady progress and will continue to benefit from skilled O.T. to progress towards LTG's   .   PERFORMANCE DEFICITS: in functional skills including ADLs, IADLs, coordination, dexterity, proprioception, sensation, edema, tone, ROM, strength, pain, flexibility, Fine motor control, Gross motor control, mobility, balance, body mechanics, endurance, cardiopulmonary status limiting function, decreased knowledge of use of DME, vision, and UE functional use, cognitive skills including safety awareness, and psychosocial skills including coping strategies.   IMPAIRMENTS: are limiting patient from ADLs, IADLs, rest and sleep, work, leisure, and social participation.   CO-MORBIDITIES: has co-morbidities such as uncontrolled HTN that affects occupational performance. Patient will benefit from skilled OT to address above impairments and improve overall function.  MODIFICATION OR ASSISTANCE TO COMPLETE EVALUATION: No modification of tasks or assist necessary to complete an evaluation.  OT OCCUPATIONAL PROFILE AND HISTORY: Detailed assessment: Review of records and additional review of physical, cognitive, psychosocial history related to current functional performance.  CLINICAL DECISION MAKING: Moderate - several treatment options, min-mod task  modification necessary  REHAB POTENTIAL: Good  EVALUATION COMPLEXITY: Moderate    PLAN:  OT FREQUENCY: 2x/week  OT DURATION: 12 weeks  PLANNED INTERVENTIONS: 97535 self care/ADL training, 02889 therapeutic exercise, 97530 therapeutic activity, 97112 neuromuscular re-education, 97140 manual therapy, 97113 aquatic therapy, 97035 ultrasound, 97018 paraffin, 02960 fluidotherapy, 97010 moist heat, 97032 electrical stimulation (manual), 97014 electrical stimulation unattended, 97760 Orthotic Initial, 97763 Orthotic/Prosthetic subsequent, passive range of motion, functional mobility training, visual/perceptual remediation/compensation, energy conservation, coping strategies training, patient/family education,  and DME and/or AE instructions  RECOMMENDED OTHER SERVICES: None at this time  CONSULTED AND AGREED WITH PLAN OF CARE: Patient  PLAN FOR NEXT SESSION: monitor BP,  fluido, UBE, continue NMR and functional use RUE, re-assess Box & Blocks   Burnard JINNY Roads, OT 08/08/2023, 10:45 AM

## 2023-08-08 NOTE — Patient Instructions (Signed)
 Coordination ex's:   1) Lay deck of cards on table, flip cards over one at a time  2) Hold deck or 1/2 deck in hand and push off top card with thumb one at a time  3) Pick up checkers one at a time and store in palm, until you get three in hand. Then move from palm to fingertips to stack one at a time  4) Place checkers into Connect 4 slots

## 2023-08-09 ENCOUNTER — Ambulatory Visit: Attending: Cardiology | Admitting: Cardiology

## 2023-08-09 ENCOUNTER — Encounter: Payer: Self-pay | Admitting: Cardiology

## 2023-08-09 VITALS — BP 128/80 | HR 104 | Ht 68.0 in | Wt 222.6 lb

## 2023-08-09 DIAGNOSIS — I10 Essential (primary) hypertension: Secondary | ICD-10-CM

## 2023-08-09 DIAGNOSIS — E118 Type 2 diabetes mellitus with unspecified complications: Secondary | ICD-10-CM | POA: Diagnosis not present

## 2023-08-09 DIAGNOSIS — I742 Embolism and thrombosis of arteries of the upper extremities: Secondary | ICD-10-CM

## 2023-08-09 DIAGNOSIS — I517 Cardiomegaly: Secondary | ICD-10-CM

## 2023-08-09 DIAGNOSIS — I429 Cardiomyopathy, unspecified: Secondary | ICD-10-CM | POA: Diagnosis not present

## 2023-08-09 NOTE — Patient Instructions (Signed)
 Medication Instructions:  Your physician recommends that you continue on your current medications as directed. Please refer to the Current Medication list given to you today.  *If you need a refill on your cardiac medications before your next appointment, please call your pharmacy*  Lab Work: NONE If you have labs (blood work) drawn today and your tests are completely normal, you will receive your results only by: MyChart Message (if you have MyChart) OR A paper copy in the mail If you have any lab test that is abnormal or we need to change your treatment, we will call you to review the results.  Testing/Procedures: Ultrasound of Right Upper Extremity  Follow-Up: At [facility], you and your health needs are our priority.  As part of our continuing mission to provide you with exceptional heart care, our providers are all part of one team.  This team includes your primary Cardiologist (physician) and Advanced Practice Providers or APPs (Physician Assistants and Nurse Practitioners) who all work together to provide you with the care you need, when you need it.  Your next appointment:   6 months  Provider:   [place], MD  We recommend signing up for the patient portal called MyChart.  Sign up information is provided on this After Visit Summary.  MyChart is used to connect with patients for Virtual Visits (Telemedicine).  Patients are able to view lab/test results, encounter notes, upcoming appointments, etc.  Non-urgent messages can be sent to your provider as well.   To learn more about what you can do with MyChart, go to [email or web address].

## 2023-08-13 ENCOUNTER — Encounter: Payer: Self-pay | Admitting: Occupational Therapy

## 2023-08-13 ENCOUNTER — Ambulatory Visit: Admitting: Occupational Therapy

## 2023-08-13 DIAGNOSIS — R208 Other disturbances of skin sensation: Secondary | ICD-10-CM

## 2023-08-13 DIAGNOSIS — I69351 Hemiplegia and hemiparesis following cerebral infarction affecting right dominant side: Secondary | ICD-10-CM

## 2023-08-13 DIAGNOSIS — M6281 Muscle weakness (generalized): Secondary | ICD-10-CM

## 2023-08-13 DIAGNOSIS — R278 Other lack of coordination: Secondary | ICD-10-CM

## 2023-08-13 DIAGNOSIS — R2681 Unsteadiness on feet: Secondary | ICD-10-CM

## 2023-08-13 DIAGNOSIS — R600 Localized edema: Secondary | ICD-10-CM

## 2023-08-13 DIAGNOSIS — M25511 Pain in right shoulder: Secondary | ICD-10-CM

## 2023-08-13 NOTE — Therapy (Signed)
 OUTPATIENT OCCUPATIONAL THERAPY NEURO TREATMENT   Patient Name: Edward Cabrera MRN: [id number] DOB:[birth year], 63 y.o., male Today's Date: 08/13/2023  PCP: Celestia Rosaline SQUIBB, NP  REFERRING PROVIDER: Darci Pore, MD  END OF SESSION:  OT End of Session - 08/13/23 1009     Visit Number 11    Number of Visits 24    Date for OT Re-Evaluation 09/18/23    Authorization Type [employer], VL: 60 PT/OT/ST    OT Start Time 1005    OT Stop Time 1100    OT Time Calculation (min) 55 min    Activity Tolerance Patient tolerated treatment well    Behavior During Therapy WFL for tasks assessed/performed          Past Medical History:  Diagnosis Date   Diabetes mellitus without complication (HCC)    Gout    Hypertension    Pulmonary embolism (HCC)    Past Surgical History:  Procedure Laterality Date   ROTATOR CUFF REPAIR Left    Patient Active Problem List   Diagnosis Date Noted   Type 2 diabetes mellitus with complication, without long-term current use of insulin  (HCC) 08/08/2023   LVH (left ventricular hypertrophy) 08/08/2023   Cardiomyopathy (HCC) 08/08/2023   Uncontrolled type 2 diabetes mellitus with hyperglycemia, without long-term current use of insulin  (HCC) 06/07/2023   Essential hypertension 06/07/2023   Hyperlipidemia 06/07/2023   Obesity (BMI 30.0-34.9) 06/07/2023   Acute CVA (cerebrovascular accident) (HCC) 06/05/2023    ONSET DATE: 06/07/2023 (referral date)   REFERRING DIAG: I63.9 (ICD-10-CM) - Acute CVA (cerebrovascular accident) (HCC)  THERAPY DIAG:  Hemiplegia and hemiparesis following cerebral infarction affecting right dominant side (HCC)  Other lack of coordination  Muscle weakness (generalized)  Other disturbances of skin sensation  Acute pain of right shoulder  Localized edema  Unsteadiness on feet  Rationale for Evaluation and Treatment: Rehabilitation  SUBJECTIVE:   SUBJECTIVE STATEMENT: My shoulder still bothers me. The hand is  still swollen  Pt accompanied by: self  PERTINENT HISTORY: admitted to [facility] on 06/05/23 with 1 week hx of worsening RUE weakness, while in ED pt developed R facial droop and slurry speech. MRI  demonstrated L frontal and L occipital infarcts. PMH of PE, CKD,DM II, HTN.   PRECAUTIONS: Other: heart monitor and no heavy lifting  WEIGHT BEARING RESTRICTIONS: No  PAIN:  Are you having pain? Rt shoulder and hand/wrist 0-8/10 - worse when lifting arm  FALLS: Has patient fallen in last 6 months? Yes. Number of falls 1  LIVING ENVIRONMENT: Lives with: lives with their family Lives in: 1 story house with 3 steps to enter Has following equipment at home: Single point cane  PLOF: Independent, working full time as Mining engineer - requires lifting/pulling, but also typing  PATIENT GOALS: use of dominant arm and hand for self care and work  OBJECTIVE:  Note: Objective measures were completed at Evaluation unless otherwise noted.  HAND DOMINANCE: Right  ADLs: Overall ADLs: mod I to min assist Transfers/ambulation related to ADLs: independent Eating: eating with Lt non dominant hand with difficulty Grooming: with Lt non dominant hand with difficulty UB Dressing: mod I with difficulty - pull over shirts only LB Dressing: assist w/ Rt shoe and sock, wearing elastic pants b/c he can't do buttons Toileting: mod I  Bathing: mod I w/ LH sponge Tub Shower transfers: mod I  Equipment: Long handled sponge and hand held shower  IADLs: Shopping: mod I  Light housekeeping: unable to fold towels or anything that  requires 2 hands Meal Prep: dependent currently (pt loves to cook and did mostly prior to CVA)  Community mobility: pt stills driving Medication management: independent Handwriting: unable Rt dominant hand, practicing w/ Lt hand  MOBILITY STATUS: Independent   FUNCTIONAL OUTCOME MEASURES: Upper Extremity Functional Scale (UEFS): 2/80 = .025 which translates to 99.075% disability RT  dominant UE  UPPER EXTREMITY ROM:  minimal shoulder movement dominated by synergy pattern and no distal control. Approx 40% elbow flex, no supination, minimal trace wrist extension and 10% finger flex/ext   UPPER EXTREMITY MMT:   NOT TESTED D/T LIMITED MOVEMENT   HAND FUNCTION: No grip strength  COORDINATION: Pt can pick up block w/ difficulty if block is stabilized but cannot maintain 07/25/23: RUE box & blocks = 22   SENSATION: Light touch/localization intact, but still has numbness/tingling  EDEMA: min to mod Rt hand  MUSCLE TONE: RUE: Hypotonic  COGNITION: Overall cognitive status: Within functional limits for tasks assessed  VISION: Subjective report: At first my vision was blurry and I was seeing spots, but it's getting better. Denies diplopia Baseline vision: No visual deficits Visual history: none  VISION ASSESSMENT: Will further assess in functional context     PERCEPTION: Not tested  PRAXIS: Not tested  OBSERVATIONS: Pt with shoulder pain and movement emerging RUE although limited and dominated by synergy pattern                                                                                                                             TREATMENT DATE: 08/13/23   BP = 137/92, HR = 88  Fluidotherapy x 12 minutes for Rt hand/wrist to address pain and stiffness. No adverse reactions.    Pt placing large pegs in pegboard Rt hand for coordination, pinch strength - different color for each row. Pt with mod difficulty. Pt completed board (5 rows) with extra time. Pt then manipulating up to 3 at a time when removing from pegboard.  Attempted rotating Chinese stress balls in Rt hand but unable. Rt hand limited by edema   Verbally reviewed updates to coordination ex's and modifications to wrist stretches. Verbally reviewed edema management strategies.   Also discussed ordering flexion glove if able. Pt shown robotic glove but may be too expensive to purchase     UBE x 8 min, level 1 for normal reciprocal movement pattern and UB conditioning       PATIENT EDUCATION: Education details: see above Person educated: Patient Education method: Explanation, Demonstration, Verbal cues, and Handouts Education comprehension: verbalized understanding, returned demonstration, verbal cues required, and needs further education  HOME EXERCISE PROGRAM: 06/20/23: initial HEP for RUE/shoulder 07/09/23: Additional HEP for forearm, wrist, hand ROM and functional use/activities  of RUE 07/11/23: Additional HEP for RUE shoulder ROM 07/16/23: contrast bath 07/18/23: prone scapula HEP  07/25/23: finger tapping ex's, Rt handed typing words, tracing activities, wrist ext stretch HEP  08/08/23: updated coordination HEP    GOALS: Goals reviewed  with patient? Yes  SHORT TERM GOALS: Target date: 07/20/23  Independent with initial HEP for RUE Baseline: Goal status: MET  2.  Pt to demo low level reaching RUE (approx 45* sh flex) in prep for functional retrieving of items from lower surfaces Baseline:  Goal status: MET (b/t 60-70*)   3.  Pt to demo 50% or greater gross composite flexion and extension Rt hand to grasp/release 1-2 cylindrical objects Baseline:  Goal status: MET   4.  Pt to begin using RUE consistently as min assist for BADLS and bilateral tasks Baseline:  Goal status: MET  5.  Pt to don Rt shoe and sock I'ly consistently with A/E prn Baseline:  Goal status: MET (Except laces)   6.  Pt to improve RUE function as evidenced by performing 27 blocks on Box & Blocks test Baseline: unable, 07/25/23: 22 blocks Goal status: REVISED   LONG TERM GOALS: Target date: 09/18/23  Independent with updated HEP  Baseline:  Goal status: MET  2.  Pt to perform mid level reaching to 90* sh flexion to retrieve light weight objects Baseline:  Goal status: INITIAL  3.  Pt to demo RUE function improvements as evidenced by performing 38 blocks or greater on Box &  Blocks test Baseline: Eval - unable, 07/25/23 = 22 blocks Goal status: REVISED  4.  Pt to be perform cooking tasks safely with min assist or less using A/E prn Baseline:  Goal status: INITIAL  5.  Pt to demo 20 lbs or greater grip strength Rt hand in order to sufficiently hold objects in Rt hand Baseline: 0 lbs, 07/30/23 = 6 lbs Goal status: INITIAL  6.  Pt to demo sufficient coordination for typing 18 wpm at 90% accuracy or verbalize understanding of voice recognition software for work related tasks Baseline:  Goal status: INITIAL  7.  UEFI scale to improve by demo 70% or less disability RUE Baseline: 99.075% Goal status: INITIAL   ASSESSMENT:  CLINICAL IMPRESSION: Patient seen today for occupational therapy treatment for CVA w/ Rt dominant side hemiplegia. Pt limited by Rt shoulder pain, Rt wrist pain, and continued edema Rt hand. Advised pt to see orthopedist for Rt shoulder pain - ? Impingement or rotator cuff tear.  Pt continues to demo steady progress and will continue to benefit from skilled O.T. to progress towards LTG's   .   PERFORMANCE DEFICITS: in functional skills including ADLs, IADLs, coordination, dexterity, proprioception, sensation, edema, tone, ROM, strength, pain, flexibility, Fine motor control, Gross motor control, mobility, balance, body mechanics, endurance, cardiopulmonary status limiting function, decreased knowledge of use of DME, vision, and UE functional use, cognitive skills including safety awareness, and psychosocial skills including coping strategies.   IMPAIRMENTS: are limiting patient from ADLs, IADLs, rest and sleep, work, leisure, and social participation.   CO-MORBIDITIES: has co-morbidities such as uncontrolled HTN that affects occupational performance. Patient will benefit from skilled OT to address above impairments and improve overall function.  MODIFICATION OR ASSISTANCE TO COMPLETE EVALUATION: No modification of tasks or assist necessary to  complete an evaluation.  OT OCCUPATIONAL PROFILE AND HISTORY: Detailed assessment: Review of records and additional review of physical, cognitive, psychosocial history related to current functional performance.  CLINICAL DECISION MAKING: Moderate - several treatment options, min-mod task modification necessary  REHAB POTENTIAL: Good  EVALUATION COMPLEXITY: Moderate    PLAN:  OT FREQUENCY: 2x/week  OT DURATION: 12 weeks  PLANNED INTERVENTIONS: 97535 self care/ADL training, 02889 therapeutic exercise, 97530 therapeutic activity, 97112 neuromuscular re-education,  97140 manual therapy, [id number] aquatic therapy, 97035 ultrasound, 02981 paraffin, 97039 fluidotherapy, 97010 moist heat, 97032 electrical stimulation (manual), 97014 electrical stimulation unattended, 97760 Orthotic Initial, 97763 Orthotic/Prosthetic subsequent, passive range of motion, functional mobility training, visual/perceptual remediation/compensation, energy conservation, coping strategies training, patient/family education, and DME and/or AE instructions  RECOMMENDED OTHER SERVICES: None at this time  CONSULTED AND AGREED WITH PLAN OF CARE: Patient  PLAN FOR NEXT SESSION: monitor BP, re-assess Box & Blocks, fluido, UBE, continue NMR and functional use RUE   Burnard JINNY Roads, OT 08/13/2023, 10:09 AM

## 2023-08-14 ENCOUNTER — Other Ambulatory Visit: Payer: Self-pay

## 2023-08-14 ENCOUNTER — Other Ambulatory Visit: Payer: Self-pay | Admitting: *Deleted

## 2023-08-14 NOTE — Patient Instructions (Signed)
 Visit Information  Thank you for taking time to visit with me today. Please don't hesitate to contact me if I can be of assistance to you before our next scheduled appointment.  Our next appointment is by telephone on 09/14/23 at 1030 am Please call the care guide team at [phone] if you need to cancel or reschedule your appointment.   Following is a copy of your care plan:   Goals Addressed             This Visit's Progress    [facility] RN Care Plan- DM       Problems:  Chronic Disease Management support and education needs related to DMII  Goal: Over the next 90 days the Patient will attend all scheduled medical appointments: PCP and Specialist as evidenced by keeping all scheduled appointments.        continue to work with Medical illustrator and/or Social Worker to address care management and care coordination needs related to DMII as evidenced by adherence to care management team scheduled appointments     take all medications exactly as prescribed and will call provider for medication related questions as evidenced by compliance.    verbalize basic understanding of DMII disease process and self health management plan as evidenced by verbal explanation, recognize, monitor symptoms and life style  modifications.    Interventions:   Diabetes Interventions: Assessed patient's understanding of A1c goal: <7% Provided education to patient about basic DM disease process Reviewed medications with patient and discussed importance of medication adherence Counseled on importance of regular laboratory monitoring as prescribed Provided patient with written educational materials related to hypo and hyperglycemia and importance of correct treatment Screening for signs and symptoms of depression related to chronic disease state  Assessed social determinant of health barriers Lab Results  Component Value Date   HGBA1C 9.5 (H) 06/06/2023    Patient Self-Care Activities:  Attend all scheduled  provider appointments Call pharmacy for medication refills 3-7 days in advance of running out of medications Call provider office for new concerns or questions  Take medications as prescribed   schedule appointment with eye doctor check blood sugar at prescribed times: before meals and at bedtime check feet daily for cuts, sores or redness limit fast food meals to no more than 1 per week manage portion size Follow up with pharmacy to check on status of glucometer.  Plan:  Telephone follow up appointment with care management team member scheduled for:  09-14-23 at 10:30          [facility] RN Care Plan- HTN and Hyperlipemia       Problems:  Chronic Disease Management support and education needs related to HLD and HTN  Goal: Over the next 90 days the Patient will attend all scheduled medical appointments:  with PCP and Specialist  as evidenced by keeping all scheduled appointments.         continue to work with Medical illustrator and/or Social Worker to address care management and care coordination needs related to HLD and HTN as evidenced by adherence to care management team scheduled appointments     take all medications exactly as prescribed and will call provider for medication related questions as evidenced by compliance.    verbalize basic understanding of HLD and HTN disease process and self health management plan as evidenced by verbal explanation, recognize, monitor symptoms and lifestyle modifications.      Interventions:   Hyperlipidemia Interventions: Medication review performed; medication list updated in electronic  medical record.  Counseled on importance of regular laboratory monitoring as prescribed Provided HLD educational materials Reviewed importance of limiting foods high in cholesterol Screening for signs and symptoms of depression related to chronic disease state Assessed social determinant of health barriers   Hypertension Interventions: Last practice recorded BP  readings:  BP Readings from Last 3 Encounters:  08/13/23 130/88  08/09/23 128/80  07/24/23 (!) 155/84   Most recent eGFR/CrCl:  Lab Results  Component Value Date   EGFR 55 (L) 06/29/2023    No components found for: CRCL  Evaluation of current treatment plan related to hypertension self management and patient's adherence to plan as established by provider Reviewed medications with patient and discussed importance of compliance Discussed plans with patient for ongoing care management follow up and provided patient with direct contact information for care management team Provided education on prescribed diet DASH /Low sodium diet. Discussed complications of poorly controlled blood pressure such as heart disease, stroke, circulatory complications, vision complications, kidney impairment, sexual dysfunction Screening for signs and symptoms of depression related to chronic disease state  Assessed social determinant of health barriers  Patient Self-Care Activities:  Attend all scheduled provider appointments Call pharmacy for medication refills 3-7 days in advance of running out of medications Call provider office for new concerns or questions  Take medications as prescribed   check blood pressure daily write blood pressure results in a log or diary call doctor for signs and symptoms of high blood pressure keep all doctor appointments take medications for blood pressure exactly as prescribed report new symptoms to your doctor eat more whole grains, fruits and vegetables, lean meats and healthy fats call for medicine refill 2 or 3 days before it runs out take all medications exactly as prescribed call doctor with any symptoms you believe are related to your medicine call doctor when you experience any new symptoms go to all doctor appointments as scheduled adhere to prescribed diet: Dash/Low sodium  Plan:  Telephone follow up appointment with care management team member scheduled  for:  09-14-23             Please call the Suicide and Crisis Lifeline: 988 call the [place]  National Suicide Prevention Lifeline: 873-130-1642 or TTY: 6847546203 TTY 7092674365) to talk to a trained counselor call 1-800-273-TALK (toll free, 24 hour hotline) go to [facility] [address], [place] [phone]) call the [facility] Crisis Line: (434)655-4187 call 911 if you are experiencing a Mental Health or Behavioral Health Crisis or need someone to talk to.  Patient verbalizes understanding of instructions and care plan provided today and agrees to view in MyChart. Active MyChart status and patient understanding of how to access instructions and care plan via MyChart confirmed with patient.     Dilara Navarrete, RN, BSN, ACM RN Care Manager [facility] [phone]    Visit Information  Thank you for taking time to visit with me today. Please don't hesitate to contact me if I can be of assistance to you before our next scheduled appointment.  Our next appointment is by telephone on 09/14/23 at 1030 am Please call the care guide team at [phone] if you need to cancel or reschedule your appointment.   Following is a copy of your care plan:   Goals Addressed             This Visit's Progress    [facility]BCI RN Care Plan- DM       Problems:  Chronic  Disease Management support and education needs related to DMII  Goal: Over the next 90 days the Patient will attend all scheduled medical appointments: PCP and Specialist as evidenced by keeping all scheduled appointments.        continue to work with Medical illustrator and/or Social Worker to address care management and care coordination needs related to DMII as evidenced by adherence to care management team scheduled appointments     take all medications exactly as prescribed and will call provider for medication related questions as evidenced by compliance.    verbalize  basic understanding of DMII disease process and self health management plan as evidenced by verbal explanation, recognize, monitor symptoms and life style  modifications.    Interventions:   Diabetes Interventions: Assessed patient's understanding of A1c goal: <7% Provided education to patient about basic DM disease process Reviewed medications with patient and discussed importance of medication adherence Counseled on importance of regular laboratory monitoring as prescribed Provided patient with written educational materials related to hypo and hyperglycemia and importance of correct treatment Screening for signs and symptoms of depression related to chronic disease state  Assessed social determinant of health barriers Lab Results  Component Value Date   HGBA1C 9.5 (H) 06/06/2023    Patient Self-Care Activities:  Attend all scheduled provider appointments Call pharmacy for medication refills 3-7 days in advance of running out of medications Call provider office for new concerns or questions  Take medications as prescribed   schedule appointment with eye doctor check blood sugar at prescribed times: before meals and at bedtime check feet daily for cuts, sores or redness limit fast food meals to no more than 1 per week manage portion size Follow up with pharmacy to check on status of glucometer.  Plan:  Telephone follow up appointment with care management team member scheduled for:  09-14-23 at 10:30          VBCI RN Care Plan- HTN and Hyperlipemia       Problems:  Chronic Disease Management support and education needs related to HLD and HTN  Goal: Over the next 90 days the Patient will attend all scheduled medical appointments:  with PCP and Specialist  as evidenced by keeping all scheduled appointments.         continue to work with Medical illustrator and/or Social Worker to address care management and care coordination needs related to HLD and HTN as evidenced by adherence to  care management team scheduled appointments     take all medications exactly as prescribed and will call provider for medication related questions as evidenced by compliance.    verbalize basic understanding of HLD and HTN disease process and self health management plan as evidenced by verbal explanation, recognize, monitor symptoms and lifestyle modifications.      Interventions:   Hyperlipidemia Interventions: Medication review performed; medication list updated in electronic medical record.  Counseled on importance of regular laboratory monitoring as prescribed Provided HLD educational materials Reviewed importance of limiting foods high in cholesterol Screening for signs and symptoms of depression related to chronic disease state Assessed social determinant of health barriers   Hypertension Interventions: Last practice recorded BP readings:  BP Readings from Last 3 Encounters:  08/13/23 130/88  08/09/23 128/80  07/24/23 (!) 155/84   Most recent eGFR/CrCl:  Lab Results  Component Value Date   EGFR 55 (L) 06/29/2023    No components found for: CRCL  Evaluation of current treatment plan related to hypertension self management and  patient's adherence to plan as established by provider Reviewed medications with patient and discussed importance of compliance Discussed plans with patient for ongoing care management follow up and provided patient with direct contact information for care management team Provided education on prescribed diet DASH /Low sodium diet. Discussed complications of poorly controlled blood pressure such as heart disease, stroke, circulatory complications, vision complications, kidney impairment, sexual dysfunction Screening for signs and symptoms of depression related to chronic disease state  Assessed social determinant of health barriers  Patient Self-Care Activities:  Attend all scheduled provider appointments Call pharmacy for medication refills 3-7 days  in advance of running out of medications Call provider office for new concerns or questions  Take medications as prescribed   check blood pressure daily write blood pressure results in a log or diary call doctor for signs and symptoms of high blood pressure keep all doctor appointments take medications for blood pressure exactly as prescribed report new symptoms to your doctor eat more whole grains, fruits and vegetables, lean meats and healthy fats call for medicine refill 2 or 3 days before it runs out take all medications exactly as prescribed call doctor with any symptoms you believe are related to your medicine call doctor when you experience any new symptoms go to all doctor appointments as scheduled adhere to prescribed diet: Dash/Low sodium  Plan:  Telephone follow up appointment with care management team member scheduled for:  09-14-23             Please call the Suicide and Crisis Lifeline: 988 call the [place]  National Suicide Prevention Lifeline: (609)380-7331 or TTY: 915-522-4742 TTY (959) 667-5530) to talk to a trained counselor call 1-800-273-TALK (toll free, 24 hour hotline) go to [facility] [address] [place] [phone]) call the [facility] Crisis Line: [phone] call 911 if you are experiencing a Mental Health or Behavioral Health Crisis or need someone to talk to.  Patient verbalizes understanding of instructions and care plan provided today and agrees to view in MyChart. Active MyChart status and patient understanding of how to access instructions and care plan via MyChart confirmed with patient.     Nicha Hemann, RN, BSN, Theatre manager [employer] [phone]

## 2023-08-14 NOTE — Patient Outreach (Signed)
 Complex Care Management   Visit Note  08/14/2023  Name:  Edward Cabrera MRN: [id number] DOB: 1960/10/15  Situation: Referral received for Complex Care Management related to Diabetes with Complications and HLD and HTN I obtained verbal consent from Patient.  Visit completed with Patient  on the phone  Background:   Past Medical History:  Diagnosis Date   Diabetes mellitus without complication (HCC)    Gout    Hypertension    Pulmonary embolism (HCC)     Assessment: Patient Reported Symptoms:  Cognitive Cognitive Status: Alert and oriented to person, place, and time, Insightful and able to interpret abstract concepts, Normal speech and language skills   Health Maintenance Behaviors: Annual physical exam, Sleep adequate, Healthy diet, Stress management Healing Pattern: Fast Health Facilitated by: Healthy diet, Prayer/meditation, Rest, Stress management  Neurological Neurological Review of Symptoms: Numbness, Weakness (Right arm and hand numbness and weakness.  OT is helping.) Neurological Management Strategies: Adequate rest, Medication therapy, Routine screening, Exercise, Diet modification Neurological Self-Management Outcome: 4 (good)  HEENT HEENT Symptoms Reported: No symptoms reported HEENT Management Strategies: Routine screening HEENT Self-Management Outcome: 4 (good)    Cardiovascular Cardiovascular Symptoms Reported: No symptoms reported Does patient have uncontrolled Hypertension?: Yes Is patient checking Blood Pressure at home?: Yes Cardiovascular Management Strategies: Routine screening  Respiratory      Endocrine Endocrine Symptoms Reported: No symptoms reported Is patient diabetic?: Yes Is patient checking blood sugars at home?: No (Reports that is awaiting a CBG machine to be called into the pharmacy.) Endocrine Self-Management Outcome: 4 (good)  Gastrointestinal Gastrointestinal Symptoms Reported: Constipation Additional Gastrointestinal Details: Reports takes  a laxative as needed for constipation. Has increased water intake. Gastrointestinal Management Strategies: Medication therapy, Diet modification, Adequate rest, Exercise Gastrointestinal Self-Management Outcome: 3 (uncertain)    Genitourinary Genitourinary Symptoms Reported: No symptoms reported Genitourinary Management Strategies: Adequate rest, Exercise, Activity Genitourinary Self-Management Outcome: 4 (good)  Integumentary Integumentary Symptoms Reported: No symptoms reported Skin Management Strategies: Routine screening Skin Self-Management Outcome: 4 (good)  Musculoskeletal Musculoskelatal Symptoms Reviewed: Weakness, Unsteady gait Additional Musculoskeletal Details: /cane occasionally to assist with unsteady gait. Musculoskeletal Management Strategies: Routine screening, Medication therapy, Medical device, Exercise, Coping strategies, Adequate rest, Activity Musculoskeletal Self-Management Outcome: 4 (good) Falls in the past year?: No Number of falls in past year: 1 or less Was there an injury with Fall?: No Fall Risk Category Calculator: 0 Patient Fall Risk Level: Low Fall Risk Patient at Risk for Falls Due to: Impaired balance/gait (Patient reports occasional unsteady gait.  Uses walker/cane as needed.) Fall risk Follow up: Falls prevention discussed, Falls evaluation completed, Education provided  Psychosocial Psychosocial Symptoms Reported: No symptoms reported Behavioral Management Strategies: Support system Behavioral Health Self-Management Outcome: 4 (good) Major Change/Loss/Stressor/Fears (CP): Medical condition, self Quality of Family Relationships: helpful, involved, supportive Do you feel physically threatened by others?: No      08/14/2023   11:09 AM  Depression screen PHQ 2/9  Decreased Interest 0  Down, Depressed, Hopeless 0  PHQ - 2 Score 0    Vitals:   08/13/23 1102  BP: 130/88    Medications Reviewed Today     Reviewed by Jorja Nichole LABOR, RN (Case  Manager) on 08/14/23 at 1046  Med List Status: <None>   Medication Order Taking? Sig Documenting Provider Last Dose Status Informant  amLODipine  (NORVASC ) 10 MG tablet [id number] Yes Take 1 tablet (10 mg total) by mouth daily. Celestia Rosaline SQUIBB, NP  Active Self, Pharmacy Records  aspirin  EC 81 MG tablet  [id number] Yes Take 1 tablet (81 mg total) by mouth daily. Swallow whole. Darci Pore, MD  Active   atorvastatin  (LIPITOR ) 80 MG tablet [id number] Yes Take 1 tablet (80 mg total) by mouth daily. Darci Pore, MD  Active   Blood Glucose Monitoring Suppl DEVI [id number]  1 each by Does not apply route in the morning, at noon, and at bedtime. May substitute to any manufacturer covered by patient's insurance.  Patient not taking: Reported on 08/14/2023   Celestia Rosaline SQUIBB, NP  Active   clopidogrel  (PLAVIX ) 75 MG tablet [id number] Yes Take 1 tablet (75 mg total) by mouth daily. Darci Pore, MD  Active   glimepiride  (AMARYL ) 2 MG tablet [id number] Yes Take 1 tablet (2 mg total) by mouth every morning. Darci Pore, MD  Active   Omega-3 Fatty Acids (ULTRA OMEGA-3 FISH OIL ) 1400 MG CAPS [id number] Yes Take 2,800 mg by mouth daily. [provider]  Active Self, Pharmacy Records  valsartan -hydrochlorothiazide  (DIOVAN -HCT) 160-25 MG tablet [id number] Yes Take 1 tablet by mouth daily. Newlin, Enobong, MD  Active             Recommendation:   DME requests:  other glucometer machine ( Follow up with pharmacy) Continue Current Plan of Care  Follow Up Plan:   Telephone follow-up 2 weeks  Dolores Mcgovern, RN, BSN, ACM RN Care Manager [employer] [phone]

## 2023-08-15 ENCOUNTER — Ambulatory Visit: Admitting: Occupational Therapy

## 2023-08-15 ENCOUNTER — Encounter: Payer: Self-pay | Admitting: Occupational Therapy

## 2023-08-15 ENCOUNTER — Ambulatory Visit (HOSPITAL_COMMUNITY)
Admission: RE | Admit: 2023-08-15 | Discharge: 2023-08-15 | Disposition: A | Discharge status: Home / Self Care | Source: Ambulatory Visit | Attending: Cardiology | Admitting: Cardiology

## 2023-08-15 ENCOUNTER — Ambulatory Visit: Payer: Self-pay | Admitting: Cardiology

## 2023-08-15 DIAGNOSIS — I69351 Hemiplegia and hemiparesis following cerebral infarction affecting right dominant side: Secondary | ICD-10-CM | POA: Diagnosis not present

## 2023-08-15 DIAGNOSIS — M6281 Muscle weakness (generalized): Secondary | ICD-10-CM

## 2023-08-15 DIAGNOSIS — R208 Other disturbances of skin sensation: Secondary | ICD-10-CM

## 2023-08-15 DIAGNOSIS — R278 Other lack of coordination: Secondary | ICD-10-CM

## 2023-08-15 DIAGNOSIS — R600 Localized edema: Secondary | ICD-10-CM

## 2023-08-15 DIAGNOSIS — I742 Embolism and thrombosis of arteries of the upper extremities: Secondary | ICD-10-CM | POA: Diagnosis present

## 2023-08-15 DIAGNOSIS — M25511 Pain in right shoulder: Secondary | ICD-10-CM

## 2023-08-15 NOTE — Therapy (Signed)
 OUTPATIENT OCCUPATIONAL THERAPY NEURO TREATMENT   Patient Name: Edward Cabrera MRN: [id number] DOB:May 08, 1960, 63 y.o., male Today's Date: 08/15/2023  PCP: Celestia Rosaline SQUIBB, NP  REFERRING PROVIDER: Darci Pore, MD  END OF SESSION:  OT End of Session - 08/15/23 1033     Visit Number 12    Number of Visits 24    Date for OT Re-Evaluation 09/18/23    Authorization Type U[employer], VL: 60 PT/OT/ST    OT Start Time 1020    OT Stop Time 1100    OT Time Calculation (min) 40 min    Activity Tolerance Patient tolerated treatment well    Behavior During Therapy WFL for tasks assessed/performed          Past Medical History:  Diagnosis Date   Diabetes mellitus without complication (HCC)    Gout    Hypertension    Pulmonary embolism (HCC)    Past Surgical History:  Procedure Laterality Date   ROTATOR CUFF REPAIR Left    Patient Active Problem List   Diagnosis Date Noted   Type 2 diabetes mellitus with complication, without long-term current use of insulin  (HCC) 08/08/2023   LVH (left ventricular hypertrophy) 08/08/2023   Cardiomyopathy (HCC) 08/08/2023   Uncontrolled type 2 diabetes mellitus with hyperglycemia, without long-term current use of insulin  (HCC) 06/07/2023   Essential hypertension 06/07/2023   Hyperlipidemia 06/07/2023   Obesity (BMI 30.0-34.9) 06/07/2023   Acute CVA (cerebrovascular accident) (HCC) 06/05/2023    ONSET DATE: 06/07/2023 (referral date)   REFERRING DIAG: I63.9 (ICD-10-CM) - Acute CVA (cerebrovascular accident) (HCC)  THERAPY DIAG:  Hemiplegia and hemiparesis following cerebral infarction affecting right dominant side (HCC)  Other lack of coordination  Muscle weakness (generalized)  Acute pain of right shoulder  Localized edema  Other disturbances of skin sensation  Rationale for Evaluation and Treatment: Rehabilitation  SUBJECTIVE:   SUBJECTIVE STATEMENT: I got the ultrasound this morning - my veins looked ok  Pt  accompanied by: self  PERTINENT HISTORY: admitted to [facility] on 06/05/23 with 1 week hx of worsening RUE weakness, while in ED pt developed R facial droop and slurry speech. MRI  demonstrated L frontal and L occipital infarcts. PMH of PE, CKD,DM II, HTN.   PRECAUTIONS: Other: heart monitor and no heavy lifting  WEIGHT BEARING RESTRICTIONS: No  PAIN:  Are you having pain? Rt shoulder and hand/wrist 0-8/10 - worse when lifting arm  FALLS: Has patient fallen in last 6 months? Yes. Number of falls 1  LIVING ENVIRONMENT: Lives with: lives with their family Lives in: 1 story house with 3 steps to enter Has following equipment at home: Single point cane  PLOF: Independent, working full time as Mining engineer - requires lifting/pulling, but also typing  PATIENT GOALS: use of dominant arm and hand for self care and work  OBJECTIVE:  Note: Objective measures were completed at Evaluation unless otherwise noted.  HAND DOMINANCE: Right  ADLs: Overall ADLs: mod I to min assist Transfers/ambulation related to ADLs: independent Eating: eating with Lt non dominant hand with difficulty Grooming: with Lt non dominant hand with difficulty UB Dressing: mod I with difficulty - pull over shirts only LB Dressing: assist w/ Rt shoe and sock, wearing elastic pants b/c he can't do buttons Toileting: mod I  Bathing: mod I w/ LH sponge Tub Shower transfers: mod I  Equipment: Long handled sponge and hand held shower  IADLs: Shopping: mod I  Light housekeeping: unable to fold towels or anything that requires 2 hands  Meal Prep: dependent currently (pt loves to cook and did mostly prior to CVA)  Community mobility: pt stills driving Medication management: independent Handwriting: unable Rt dominant hand, practicing w/ Lt hand  MOBILITY STATUS: Independent   FUNCTIONAL OUTCOME MEASURES: Upper Extremity Functional Scale (UEFS): 2/80 = .025 which translates to 99.075% disability RT dominant UE  UPPER  EXTREMITY ROM:  minimal shoulder movement dominated by synergy pattern and no distal control. Approx 40% elbow flex, no supination, minimal trace wrist extension and 10% finger flex/ext   UPPER EXTREMITY MMT:   NOT TESTED D/T LIMITED MOVEMENT   HAND FUNCTION: No grip strength  COORDINATION: Pt can pick up block w/ difficulty if block is stabilized but cannot maintain 07/25/23: RUE box & blocks = 22   SENSATION: Light touch/localization intact, but still has numbness/tingling  EDEMA: min to mod Rt hand  MUSCLE TONE: RUE: Hypotonic  COGNITION: Overall cognitive status: Within functional limits for tasks assessed  VISION: Subjective report: At first my vision was blurry and I was seeing spots, but it's getting better. Denies diplopia Baseline vision: No visual deficits Visual history: none  VISION ASSESSMENT: Will further assess in functional context     PERCEPTION: Not tested  PRAXIS: Not tested  OBSERVATIONS: Pt with shoulder pain and movement emerging RUE although limited and dominated by synergy pattern                                                                                                                             TREATMENT DATE: 08/13/23   BP = 127/73, HR = 85  Box & Blocks Rt = 32  Fluidotherapy x 12 minutes for Rt hand/wrist to address pain and stiffness. No adverse reactions.    Gripper set at level 1 resistance to pick up blocks Rt hand for sustained grip strength w/ increasing difficulty as hand fatigues.   Practiced flipping regular sized cards over for [facility] and supination of FA   UBE x 5 min, level 1 for normal reciprocal movement pattern and UB conditioning       PATIENT EDUCATION: Education details: see above Person educated: Patient Education method: Explanation, Demonstration, Verbal cues, and Handouts Education comprehension: verbalized understanding, returned demonstration, verbal cues required, and needs further education  HOME  EXERCISE PROGRAM: 06/20/23: initial HEP for RUE/shoulder 07/09/23: Additional HEP for forearm, wrist, hand ROM and functional use/activities  of RUE 07/11/23: Additional HEP for RUE shoulder ROM 07/16/23: contrast bath 07/18/23: prone scapula HEP  07/25/23: finger tapping ex's, Rt handed typing words, tracing activities, wrist ext stretch HEP  08/08/23: updated coordination HEP    GOALS: Goals reviewed with patient? Yes  SHORT TERM GOALS: Target date: 07/20/23  Independent with initial HEP for RUE Baseline: Goal status: MET  2.  Pt to demo low level reaching RUE (approx 45* sh flex) in prep for functional retrieving of items from lower surfaces Baseline:  Goal status: MET (b/t 60-70*)   3.  Pt to  demo 50% or greater gross composite flexion and extension Rt hand to grasp/release 1-2 cylindrical objects Baseline:  Goal status: MET   4.  Pt to begin using RUE consistently as min assist for BADLS and bilateral tasks Baseline:  Goal status: MET  5.  Pt to don Rt shoe and sock I'ly consistently with A/E prn Baseline:  Goal status: MET (Except laces)   6.  Pt to improve RUE function as evidenced by performing 27 blocks on Box & Blocks test Baseline: unable, 07/25/23: 22 blocks Goal status: MET (08/15/23 = 32 blocks)    LONG TERM GOALS: Target date: 09/18/23  Independent with updated HEP  Baseline:  Goal status: MET  2.  Pt to perform mid level reaching to 90* sh flexion to retrieve light weight objects Baseline:  Goal status: INITIAL  3.  Pt to demo RUE function improvements as evidenced by performing 38 blocks or greater on Box & Blocks test Baseline: Eval - unable, 07/25/23 = 22 blocks, 08/15/23 = 32 blocks Goal status: REVISED  4.  Pt to be perform cooking tasks safely with min assist or less using A/E prn Baseline:  Goal status: INITIAL  5.  Pt to demo 20 lbs or greater grip strength Rt hand in order to sufficiently hold objects in Rt hand Baseline: 0 lbs, 07/30/23 = 6  lbs Goal status: INITIAL  6.  Pt to demo sufficient coordination for typing 18 wpm at 90% accuracy or verbalize understanding of voice recognition software for work related tasks Baseline:  Goal status: INITIAL  7.  UEFI scale to improve by demo 70% or less disability RUE Baseline: 99.075% Goal status: INITIAL   ASSESSMENT:  CLINICAL IMPRESSION: Patient seen today for occupational therapy treatment for CVA w/ Rt dominant side hemiplegia. Pt limited by Rt shoulder pain, Rt wrist pain, and continued edema Rt hand, however has met all STG's and improved in Box & Blocks this date. Advised pt to see orthopedist for Rt shoulder pain - ? Impingement or rotator cuff tear.  Pt continues to demo steady progress and will continue to benefit from skilled O.T. to progress towards LTG's   .   PERFORMANCE DEFICITS: in functional skills including ADLs, IADLs, coordination, dexterity, proprioception, sensation, edema, tone, ROM, strength, pain, flexibility, Fine motor control, Gross motor control, mobility, balance, body mechanics, endurance, cardiopulmonary status limiting function, decreased knowledge of use of DME, vision, and UE functional use, cognitive skills including safety awareness, and psychosocial skills including coping strategies.   IMPAIRMENTS: are limiting patient from ADLs, IADLs, rest and sleep, work, leisure, and social participation.   CO-MORBIDITIES: has co-morbidities such as uncontrolled HTN that affects occupational performance. Patient will benefit from skilled OT to address above impairments and improve overall function.  MODIFICATION OR ASSISTANCE TO COMPLETE EVALUATION: No modification of tasks or assist necessary to complete an evaluation.  OT OCCUPATIONAL PROFILE AND HISTORY: Detailed assessment: Review of records and additional review of physical, cognitive, psychosocial history related to current functional performance.  CLINICAL DECISION MAKING: Moderate - several  treatment options, min-mod task modification necessary  REHAB POTENTIAL: Good  EVALUATION COMPLEXITY: Moderate    PLAN:  OT FREQUENCY: 2x/week  OT DURATION: 12 weeks  PLANNED INTERVENTIONS: 97535 self care/ADL training, 02889 therapeutic exercise, 97530 therapeutic activity, 97112 neuromuscular re-education, 97140 manual therapy, 97113 aquatic therapy, 97035 ultrasound, 97018 paraffin, 02960 fluidotherapy, 97010 moist heat, 97032 electrical stimulation (manual), 97014 electrical stimulation unattended, 97760 Orthotic Initial, [id number] Orthotic/Prosthetic subsequent, passive range of motion, functional mobility training,  visual/perceptual remediation/compensation, energy conservation, coping strategies training, patient/family education, and DME and/or AE instructions  RECOMMENDED OTHER SERVICES: None at this time  CONSULTED AND AGREED WITH PLAN OF CARE: Patient  PLAN FOR NEXT SESSION: monitor BP, fluido prn, UBE, continue NMR (work prone) and functional use RUE   Burnard JINNY Roads, OT 08/15/2023, 10:33 AM

## 2023-08-17 ENCOUNTER — Telehealth (INDEPENDENT_AMBULATORY_CARE_PROVIDER_SITE_OTHER): Payer: Self-pay | Admitting: Primary Care

## 2023-08-17 NOTE — Telephone Encounter (Signed)
Patient returned RN's call regarding test results.

## 2023-08-17 NOTE — Telephone Encounter (Signed)
 Copied from CRM #[id number]. Topic: Clinical - Medication Question >> Aug 17, 2023  7:58 AM Larissa RAMAN wrote: Reason for CRM: Patient requesting a refill on Sildenafil . Medication is not on current med list. Patient states his BP is averaging 128/78 with medication.    ----------------------------------------------------------------------- From previous Reason for Contact - Medication Refill: Medication:  Has the patient contacted their pharmacy?   (Agent: If no, request that the patient contact the pharmacy for the refill. If patient does not wish to contact the pharmacy document the reason why and proceed with request.) (Agent: If yes, when and what did the pharmacy advise?)  This is the patient's preferred pharmacy:  [id number] - [place] ([place]), [place] - [address] [place] ([place]) [place] [address] Phone: [phone] Fax: [phone]  Is this the correct pharmacy for this prescription?   If no, delete pharmacy and type the correct one.   Has the prescription been filled recently?    Is the patient out of the medication?    Has the patient been seen for an appointment in the last year OR does the patient have an upcoming appointment?    Can we respond through MyChart?    Agent: Please be advised that Rx refills may take up to 3 business days. We ask that you follow-up with your pharmacy.

## 2023-08-17 NOTE — Telephone Encounter (Signed)
 Routing to PCP for review.

## 2023-08-21 ENCOUNTER — Ambulatory Visit: Admitting: Pharmacist

## 2023-08-23 ENCOUNTER — Other Ambulatory Visit (INDEPENDENT_AMBULATORY_CARE_PROVIDER_SITE_OTHER): Payer: Self-pay | Admitting: Primary Care

## 2023-08-23 MED ORDER — TADALAFIL 10 MG PO TABS: 10.0000 mg | ORAL_TABLET | ORAL | 1 refills | Status: DC | PRN

## 2023-08-27 ENCOUNTER — Telehealth (INDEPENDENT_AMBULATORY_CARE_PROVIDER_SITE_OTHER): Payer: Self-pay | Admitting: Primary Care

## 2023-08-27 ENCOUNTER — Ambulatory Visit: Admitting: Occupational Therapy

## 2023-08-27 DIAGNOSIS — R600 Localized edema: Secondary | ICD-10-CM

## 2023-08-27 DIAGNOSIS — R278 Other lack of coordination: Secondary | ICD-10-CM

## 2023-08-27 DIAGNOSIS — R208 Other disturbances of skin sensation: Secondary | ICD-10-CM

## 2023-08-27 DIAGNOSIS — M25511 Pain in right shoulder: Secondary | ICD-10-CM

## 2023-08-27 DIAGNOSIS — I69351 Hemiplegia and hemiparesis following cerebral infarction affecting right dominant side: Secondary | ICD-10-CM | POA: Diagnosis not present

## 2023-08-27 DIAGNOSIS — M6281 Muscle weakness (generalized): Secondary | ICD-10-CM

## 2023-08-27 NOTE — Telephone Encounter (Addendum)
 Last OV was 06/13/2023.  Will await for PCP to reply if OV needed or to complete paperwork.   Paperwork has 7-10 business days to complete.

## 2023-08-27 NOTE — Telephone Encounter (Signed)
 Copied from CRM #[id number]. Topic: General - Other >> Aug 27, 2023  9:39 AM Delon HERO wrote: Reason for CRM: Patient is calling to report that [employer] has sent short disability and needed Rosaline to complete. And the forms to be returned to [email or web address]

## 2023-08-27 NOTE — Therapy (Signed)
 OUTPATIENT OCCUPATIONAL THERAPY NEURO TREATMENT   Patient Name: Edward Cabrera MRN: [id number] DOB:[birth year], 63 y.o., male Today's Date: 08/27/2023  PCP: Celestia Rosaline SQUIBB, NP  REFERRING PROVIDER: Darci Pore, MD  END OF SESSION:  OT End of Session - 08/27/23 1024     Visit Number 13    Number of Visits 24    Date for OT Re-Evaluation 09/18/23    Authorization Type UHC, VL: 60 PT/OT/ST    OT Start Time 1020    OT Stop Time 1100    OT Time Calculation (min) 40 min    Activity Tolerance Patient tolerated treatment well    Behavior During Therapy WFL for tasks assessed/performed          Past Medical History:  Diagnosis Date   Diabetes mellitus without complication (HCC)    Gout    Hypertension    Pulmonary embolism (HCC)    Past Surgical History:  Procedure Laterality Date   ROTATOR CUFF REPAIR Left    Patient Active Problem List   Diagnosis Date Noted   Type 2 diabetes mellitus with complication, without long-term current use of insulin  (HCC) 08/08/2023   LVH (left ventricular hypertrophy) 08/08/2023   Cardiomyopathy (HCC) 08/08/2023   Uncontrolled type 2 diabetes mellitus with hyperglycemia, without long-term current use of insulin  (HCC) 06/07/2023   Essential hypertension 06/07/2023   Hyperlipidemia 06/07/2023   Obesity (BMI 30.0-34.9) 06/07/2023   Acute CVA (cerebrovascular accident) (HCC) 06/05/2023    ONSET DATE: 06/07/2023 (referral date)   REFERRING DIAG: I63.9 (ICD-10-CM) - Acute CVA (cerebrovascular accident) (HCC)  THERAPY DIAG:  Hemiplegia and hemiparesis following cerebral infarction affecting right dominant side (HCC)  Other lack of coordination  Muscle weakness (generalized)  Acute pain of right shoulder  Localized edema  Other disturbances of skin sensation  Rationale for Evaluation and Treatment: Rehabilitation  SUBJECTIVE:   SUBJECTIVE STATEMENT: My shoulder and wrist still hurt and don't seem to be getting better.  I see the orthopedic next week (09/04/23)  Pt accompanied by: self  PERTINENT HISTORY: admitted to [facility] on 06/05/23 with 1 week hx of worsening RUE weakness, while in ED pt developed R facial droop and slurry speech. MRI  demonstrated L frontal and L occipital infarcts. PMH of PE, CKD,DM II, HTN.   PRECAUTIONS: Other: heart monitor and no heavy lifting  WEIGHT BEARING RESTRICTIONS: No  PAIN:  Are you having pain? Rt shoulder and hand/wrist 0-8/10 - worse when lifting arm  FALLS: Has patient fallen in last 6 months? Yes. Number of falls 1  LIVING ENVIRONMENT: Lives with: lives with their family Lives in: 1 story house with 3 steps to enter Has following equipment at home: Single point cane  PLOF: Independent, working full time as Mining engineer - requires lifting/pulling, but also typing  PATIENT GOALS: use of dominant arm and hand for self care and work  OBJECTIVE:  Note: Objective measures were completed at Evaluation unless otherwise noted.  HAND DOMINANCE: Right  ADLs: Overall ADLs: mod I to min assist Transfers/ambulation related to ADLs: independent Eating: eating with Lt non dominant hand with difficulty Grooming: with Lt non dominant hand with difficulty UB Dressing: mod I with difficulty - pull over shirts only LB Dressing: assist w/ Rt shoe and sock, wearing elastic pants b/c he can't do buttons Toileting: mod I  Bathing: mod I w/ LH sponge Tub Shower transfers: mod I  Equipment: Long handled sponge and hand held shower  IADLs: Shopping: mod I  Sharie housekeeping: unable  to fold towels or anything that requires 2 hands Meal Prep: dependent currently (pt loves to cook and did mostly prior to CVA)  Community mobility: pt stills driving Medication management: independent Handwriting: unable Rt dominant hand, practicing w/ Lt hand  MOBILITY STATUS: Independent   FUNCTIONAL OUTCOME MEASURES: Upper Extremity Functional Scale (UEFS): 2/80 = .025 which translates to  99.075% disability RT dominant UE  UPPER EXTREMITY ROM:  minimal shoulder movement dominated by synergy pattern and no distal control. Approx 40% elbow flex, no supination, minimal trace wrist extension and 10% finger flex/ext   UPPER EXTREMITY MMT:   NOT TESTED D/T LIMITED MOVEMENT   HAND FUNCTION: No grip strength  COORDINATION: Pt can pick up block w/ difficulty if block is stabilized but cannot maintain 07/25/23: RUE box & blocks = 22   SENSATION: Light touch/localization intact, but still has numbness/tingling  EDEMA: min to mod Rt hand  MUSCLE TONE: RUE: Hypotonic  COGNITION: Overall cognitive status: Within functional limits for tasks assessed  VISION: Subjective report: At first my vision was blurry and I was seeing spots, but it's getting better. Denies diplopia Baseline vision: No visual deficits Visual history: none  VISION ASSESSMENT: Will further assess in functional context     PERCEPTION: Not tested  PRAXIS: Not tested  OBSERVATIONS: Pt with shoulder pain and movement emerging RUE although limited and dominated by synergy pattern                                                                                                                             TREATMENT DATE: 08/27/23   BP = 151/91, HR = 75  Prone: reviewed scapula retraction ex x 10 reps w/ cues to hold position Rt side - pt very weak posterior sh girdle.  Prone with RUE over edge of bed for shoulder extension against gravity with max difficulty d/t weakness No pain with either of the above ex's but difficult Pain in shoulder and wrist continue - pt sees orthopedic MD next week (? Sh impingement or RTC)  Reviewed wrist stretch in passive extension and encouraged pt to hold longer, as well as light weight bearing ex for wrist as tolerated  Fluidotherapy x 12 minutes for Rt hand/wrist to address pain and stiffness. No adverse reactions.     UBE x 5 min, level 1 for normal reciprocal  movement pattern and UB conditioning       PATIENT EDUCATION: Education details: see above Person educated: Patient Education method: Explanation, Demonstration, Verbal cues, and Handouts Education comprehension: verbalized understanding, returned demonstration, verbal cues required, and needs further education  HOME EXERCISE PROGRAM: 06/20/23: initial HEP for RUE/shoulder 07/09/23: Additional HEP for forearm, wrist, hand ROM and functional use/activities  of RUE 07/11/23: Additional HEP for RUE shoulder ROM 07/16/23: contrast bath 07/18/23: prone scapula HEP  07/25/23: finger tapping ex's, Rt handed typing words, tracing activities, wrist ext stretch HEP  08/08/23: updated coordination HEP    GOALS: Goals reviewed  with patient? Yes  SHORT TERM GOALS: Target date: 07/20/23  Independent with initial HEP for RUE Baseline: Goal status: MET  2.  Pt to demo low level reaching RUE (approx 45* sh flex) in prep for functional retrieving of items from lower surfaces Baseline:  Goal status: MET (b/t 60-70*)   3.  Pt to demo 50% or greater gross composite flexion and extension Rt hand to grasp/release 1-2 cylindrical objects Baseline:  Goal status: MET   4.  Pt to begin using RUE consistently as min assist for BADLS and bilateral tasks Baseline:  Goal status: MET  5.  Pt to don Rt shoe and sock I'ly consistently with A/E prn Baseline:  Goal status: MET (Except laces)   6.  Pt to improve RUE function as evidenced by performing 27 blocks on Box & Blocks test Baseline: unable, 07/25/23: 22 blocks Goal status: MET (08/15/23 = 32 blocks)    LONG TERM GOALS: Target date: 09/18/23  Independent with updated HEP  Baseline:  Goal status: MET  2.  Pt to perform mid level reaching to 90* sh flexion to retrieve light weight objects Baseline:  Goal status: INITIAL  3.  Pt to demo RUE function improvements as evidenced by performing 38 blocks or greater on Box & Blocks test Baseline: Eval -  unable, 07/25/23 = 22 blocks, 08/15/23 = 32 blocks Goal status: REVISED  4.  Pt to be perform cooking tasks safely with min assist or less using A/E prn Baseline:  Goal status: INITIAL  5.  Pt to demo 20 lbs or greater grip strength Rt hand in order to sufficiently hold objects in Rt hand Baseline: 0 lbs, 07/30/23 = 6 lbs Goal status: INITIAL  6.  Pt to demo sufficient coordination for typing 18 wpm at 90% accuracy or verbalize understanding of voice recognition software for work related tasks Baseline:  Goal status: INITIAL  7.  UEFI scale to improve by demo 70% or less disability RUE Baseline: 99.075% Goal status: INITIAL   ASSESSMENT:  CLINICAL IMPRESSION: Patient seen today for occupational therapy treatment for CVA w/ Rt dominant side hemiplegia. Pt limited by Rt shoulder pain, Rt wrist pain, and continued edema Rt hand, however has met all STG's and improved in Box & Blocks this date. Advised pt to see orthopedist for Rt shoulder pain - ? Impingement or rotator cuff tear.  Pt continues to demo steady progress and will continue to benefit from skilled O.T. to progress towards LTG's   .   PERFORMANCE DEFICITS: in functional skills including ADLs, IADLs, coordination, dexterity, proprioception, sensation, edema, tone, ROM, strength, pain, flexibility, Fine motor control, Gross motor control, mobility, balance, body mechanics, endurance, cardiopulmonary status limiting function, decreased knowledge of use of DME, vision, and UE functional use, cognitive skills including safety awareness, and psychosocial skills including coping strategies.   IMPAIRMENTS: are limiting patient from ADLs, IADLs, rest and sleep, work, leisure, and social participation.   CO-MORBIDITIES: has co-morbidities such as uncontrolled HTN that affects occupational performance. Patient will benefit from skilled OT to address above impairments and improve overall function.  MODIFICATION OR ASSISTANCE TO COMPLETE  EVALUATION: No modification of tasks or assist necessary to complete an evaluation.  OT OCCUPATIONAL PROFILE AND HISTORY: Detailed assessment: Review of records and additional review of physical, cognitive, psychosocial history related to current functional performance.  CLINICAL DECISION MAKING: Moderate - several treatment options, min-mod task modification necessary  REHAB POTENTIAL: Good  EVALUATION COMPLEXITY: Moderate    PLAN:  OT FREQUENCY:  2x/week  OT DURATION: 12 weeks  PLANNED INTERVENTIONS: 97535 self care/ADL training, 02889 therapeutic exercise, 97530 therapeutic activity, 97112 neuromuscular re-education, 97140 manual therapy, 97113 aquatic therapy, 97035 ultrasound, 97018 paraffin, 02960 fluidotherapy, 97010 moist heat, 97032 electrical stimulation (manual), 97014 electrical stimulation unattended, 97760 Orthotic Initial, 97763 Orthotic/Prosthetic subsequent, passive range of motion, functional mobility training, visual/perceptual remediation/compensation, energy conservation, coping strategies training, patient/family education, and DME and/or AE instructions  RECOMMENDED OTHER SERVICES: None at this time  CONSULTED AND AGREED WITH PLAN OF CARE: Patient  PLAN FOR NEXT SESSION: monitor BP, fluido prn, UBE, continue NMR (work prone) and functional use RUE   Burnard JINNY Roads, OT 08/27/2023, 10:25 AM

## 2023-08-28 ENCOUNTER — Telehealth: Payer: Self-pay | Admitting: Primary Care

## 2023-08-28 NOTE — Telephone Encounter (Signed)
 Called patient to confirm upcoming appointment 08/31/2023. Patient appointment has been successfully confirmed

## 2023-08-29 ENCOUNTER — Ambulatory Visit: Admitting: Occupational Therapy

## 2023-08-29 ENCOUNTER — Encounter: Payer: Self-pay | Admitting: Occupational Therapy

## 2023-08-29 DIAGNOSIS — M25511 Pain in right shoulder: Secondary | ICD-10-CM

## 2023-08-29 DIAGNOSIS — I69351 Hemiplegia and hemiparesis following cerebral infarction affecting right dominant side: Secondary | ICD-10-CM | POA: Diagnosis not present

## 2023-08-29 DIAGNOSIS — R208 Other disturbances of skin sensation: Secondary | ICD-10-CM

## 2023-08-29 DIAGNOSIS — R278 Other lack of coordination: Secondary | ICD-10-CM

## 2023-08-29 DIAGNOSIS — R600 Localized edema: Secondary | ICD-10-CM

## 2023-08-29 NOTE — Therapy (Signed)
 OUTPATIENT OCCUPATIONAL THERAPY NEURO TREATMENT   Patient Name: Edward Cabrera MRN: [id number] DOB:[birth year], 63 y.o., male Today's Date: 08/29/2023  PCP: Celestia Rosaline SQUIBB, NP  REFERRING PROVIDER: Darci Pore, MD  END OF SESSION:  OT End of Session - 08/29/23 0849     Visit Number 14    Number of Visits 24    Date for OT Re-Evaluation 09/18/23    Authorization Type [employer], VL: 60 PT/OT/ST    OT Start Time 0848    OT Stop Time 0930    OT Time Calculation (min) 42 min    Activity Tolerance Patient tolerated treatment well    Behavior During Therapy WFL for tasks assessed/performed          Past Medical History:  Diagnosis Date   Diabetes mellitus without complication (HCC)    Gout    Hypertension    Pulmonary embolism (HCC)    Past Surgical History:  Procedure Laterality Date   ROTATOR CUFF REPAIR Left    Patient Active Problem List   Diagnosis Date Noted   Type 2 diabetes mellitus with complication, without long-term current use of insulin  (HCC) 08/08/2023   LVH (left ventricular hypertrophy) 08/08/2023   Cardiomyopathy (HCC) 08/08/2023   Uncontrolled type 2 diabetes mellitus with hyperglycemia, without long-term current use of insulin  (HCC) 06/07/2023   Essential hypertension 06/07/2023   Hyperlipidemia 06/07/2023   Obesity (BMI 30.0-34.9) 06/07/2023   Acute CVA (cerebrovascular accident) (HCC) 06/05/2023    ONSET DATE: 06/07/2023 (referral date)   REFERRING DIAG: I63.9 (ICD-10-CM) - Acute CVA (cerebrovascular accident) (HCC)  THERAPY DIAG:  Hemiplegia and hemiparesis following cerebral infarction affecting right dominant side (HCC)  Other lack of coordination  Acute pain of right shoulder  Localized edema  Other disturbances of skin sensation  Rationale for Evaluation and Treatment: Rehabilitation  SUBJECTIVE:   SUBJECTIVE STATEMENT: My shoulder and wrist still hurt and don't seem to be getting better. I see the orthopedic next week  (09/04/23). No pain in the shoulder at rest but when I lift it, it hurts Pt accompanied by: self  PERTINENT HISTORY: admitted to [facility] on 06/05/23 with 1 week hx of worsening RUE weakness, while in ED pt developed R facial droop and slurry speech. MRI  demonstrated L frontal and L occipital infarcts. PMH of PE, CKD,DM II, HTN.   PRECAUTIONS: Other: heart monitor and no heavy lifting  WEIGHT BEARING RESTRICTIONS: No  PAIN:  Are you having pain? Rt shoulder and hand/wrist 0-8/10 - worse when lifting arm  FALLS: Has patient fallen in last 6 months? Yes. Number of falls 1  LIVING ENVIRONMENT: Lives with: lives with their family Lives in: 1 story house with 3 steps to enter Has following equipment at home: Single point cane  PLOF: Independent, working full time as Mining engineer - requires lifting/pulling, but also typing  PATIENT GOALS: use of dominant arm and hand for self care and work  OBJECTIVE:  Note: Objective measures were completed at Evaluation unless otherwise noted.  HAND DOMINANCE: Right  ADLs: Overall ADLs: mod I to min assist Transfers/ambulation related to ADLs: independent Eating: eating with Lt non dominant hand with difficulty Grooming: with Lt non dominant hand with difficulty UB Dressing: mod I with difficulty - pull over shirts only LB Dressing: assist w/ Rt shoe and sock, wearing elastic pants b/c he can't do buttons Toileting: mod I  Bathing: mod I w/ LH sponge Tub Shower transfers: mod I  Equipment: Long handled sponge and hand held shower  IADLs: Shopping: mod I  Light housekeeping: unable to fold towels or anything that requires 2 hands Meal Prep: dependent currently (pt loves to cook and did mostly prior to CVA)  Community mobility: pt stills driving Medication management: independent Handwriting: unable Rt dominant hand, practicing w/ Lt hand  MOBILITY STATUS: Independent   FUNCTIONAL OUTCOME MEASURES: Upper Extremity Functional Scale (UEFS):  2/80 = .025 which translates to 99.075% disability RT dominant UE  UPPER EXTREMITY ROM:  minimal shoulder movement dominated by synergy pattern and no distal control. Approx 40% elbow flex, no supination, minimal trace wrist extension and 10% finger flex/ext   UPPER EXTREMITY MMT:   NOT TESTED D/T LIMITED MOVEMENT   HAND FUNCTION: No grip strength  COORDINATION: Pt can pick up block w/ difficulty if block is stabilized but cannot maintain 07/25/23: RUE box & blocks = 22   SENSATION: Light touch/localization intact, but still has numbness/tingling  EDEMA: min to mod Rt hand  MUSCLE TONE: RUE: Hypotonic  COGNITION: Overall cognitive status: Within functional limits for tasks assessed  VISION: Subjective report: At first my vision was blurry and I was seeing spots, but it's getting better. Denies diplopia Baseline vision: No visual deficits Visual history: none  VISION ASSESSMENT: Will further assess in functional context     PERCEPTION: Not tested  PRAXIS: Not tested  OBSERVATIONS: Pt with shoulder pain and movement emerging RUE although limited and dominated by synergy pattern                                                                                                                             TREATMENT DATE: 08/29/23   BP = 152/100, HR = 81  BP = 152/86, HR = 76 (using different BP machine)   Fluidotherapy x 12 minutes for Rt hand/wrist to address pain and stiffness. No adverse reactions.  (Above BP taken while pt in fluido)  Gripper set at level 1 resistance to pick up blocks Rt hand for sustained grip strength, functional use, and proprioceptive feedback with mod difficulty  Placing large pegs in pegboard RUE w/ mod compensations at shoulder (5 rows), then removing for functional use, low to mid level reaching, coordination and pinch strength RUE      PATIENT EDUCATION: Education details: see above Person educated: Patient Education method:  Explanation, Demonstration, Verbal cues, and Handouts Education comprehension: verbalized understanding, returned demonstration, verbal cues required, and needs further education  HOME EXERCISE PROGRAM: 06/20/23: initial HEP for RUE/shoulder 07/09/23: Additional HEP for forearm, wrist, hand ROM and functional use/activities  of RUE 07/11/23: Additional HEP for RUE shoulder ROM 07/16/23: contrast bath 07/18/23: prone scapula HEP  07/25/23: finger tapping ex's, Rt handed typing words, tracing activities, wrist ext stretch HEP  08/08/23: updated coordination HEP    GOALS: Goals reviewed with patient? Yes  SHORT TERM GOALS: Target date: 07/20/23  Independent with initial HEP for RUE Baseline: Goal status: MET  2.  Pt to demo low level reaching  RUE (approx 45* sh flex) in prep for functional retrieving of items from lower surfaces Baseline:  Goal status: MET (b/t 60-70*)   3.  Pt to demo 50% or greater gross composite flexion and extension Rt hand to grasp/release 1-2 cylindrical objects Baseline:  Goal status: MET   4.  Pt to begin using RUE consistently as min assist for BADLS and bilateral tasks Baseline:  Goal status: MET  5.  Pt to don Rt shoe and sock I'ly consistently with A/E prn Baseline:  Goal status: MET (Except laces)   6.  Pt to improve RUE function as evidenced by performing 27 blocks on Box & Blocks test Baseline: unable, 07/25/23: 22 blocks Goal status: MET (08/15/23 = 32 blocks)    LONG TERM GOALS: Target date: 09/18/23  Independent with updated HEP  Baseline:  Goal status: MET  2.  Pt to perform mid level reaching to 90* sh flexion to retrieve light weight objects Baseline:  Goal status: INITIAL  3.  Pt to demo RUE function improvements as evidenced by performing 38 blocks or greater on Box & Blocks test Baseline: Eval - unable, 07/25/23 = 22 blocks, 08/15/23 = 32 blocks Goal status: REVISED  4.  Pt to be perform cooking tasks safely with min assist or less  using A/E prn Baseline:  Goal status: INITIAL  5.  Pt to demo 20 lbs or greater grip strength Rt hand in order to sufficiently hold objects in Rt hand Baseline: 0 lbs, 07/30/23 = 6 lbs Goal status: INITIAL  6.  Pt to demo sufficient coordination for typing 18 wpm at 90% accuracy or verbalize understanding of voice recognition software for work related tasks Baseline:  Goal status: INITIAL  7.  UEFI scale to improve by demo 70% or less disability RUE Baseline: 99.075% Goal status: INITIAL   ASSESSMENT:  CLINICAL IMPRESSION: Patient seen today for occupational therapy treatment for CVA w/ Rt dominant side hemiplegia. Pt limited by Rt shoulder pain, Rt wrist pain, and continued edema Rt hand, however has met all STG's. Pt with upcoming orthopedic visit to address concerns with Rt shoulder.  Pt continues to demo steady progress and will continue to benefit from skilled O.T. to progress towards LTG's   .   PERFORMANCE DEFICITS: in functional skills including ADLs, IADLs, coordination, dexterity, proprioception, sensation, edema, tone, ROM, strength, pain, flexibility, Fine motor control, Gross motor control, mobility, balance, body mechanics, endurance, cardiopulmonary status limiting function, decreased knowledge of use of DME, vision, and UE functional use, cognitive skills including safety awareness, and psychosocial skills including coping strategies.   IMPAIRMENTS: are limiting patient from ADLs, IADLs, rest and sleep, work, leisure, and social participation.   CO-MORBIDITIES: has co-morbidities such as uncontrolled HTN that affects occupational performance. Patient will benefit from skilled OT to address above impairments and improve overall function.  MODIFICATION OR ASSISTANCE TO COMPLETE EVALUATION: No modification of tasks or assist necessary to complete an evaluation.  OT OCCUPATIONAL PROFILE AND HISTORY: Detailed assessment: Review of records and additional review of physical,  cognitive, psychosocial history related to current functional performance.  CLINICAL DECISION MAKING: Moderate - several treatment options, min-mod task modification necessary  REHAB POTENTIAL: Good  EVALUATION COMPLEXITY: Moderate    PLAN:  OT FREQUENCY: 2x/week  OT DURATION: 12 weeks  PLANNED INTERVENTIONS: 97535 self care/ADL training, 02889 therapeutic exercise, 97530 therapeutic activity, 97112 neuromuscular re-education, 97140 manual therapy, 97113 aquatic therapy, 97035 ultrasound, 97018 paraffin, 02960 fluidotherapy, 97010 moist heat, 97032 electrical stimulation (manual), 97014 electrical  stimulation unattended, 02239 Orthotic Initial, [phone] Orthotic/Prosthetic subsequent, passive range of motion, functional mobility training, visual/perceptual remediation/compensation, energy conservation, coping strategies training, patient/family education, and DME and/or AE instructions  RECOMMENDED OTHER SERVICES: None at this time  CONSULTED AND AGREED WITH PLAN OF CARE: Patient  PLAN FOR NEXT SESSION: monitor BP, fluido prn, UBE, continue NMR (wt bearing - body on arm movement, arm on body movement as able) and functional use RUE   Burnard JINNY Roads, OT 08/29/2023, 8:50 AM

## 2023-08-29 NOTE — Telephone Encounter (Signed)
 Will call patient when paperwork is completed by PCP.

## 2023-08-30 NOTE — Telephone Encounter (Signed)
 Patient has an appointment scheduled on 08/31/2023.

## 2023-08-31 ENCOUNTER — Ambulatory Visit: Attending: Primary Care | Admitting: Pharmacist

## 2023-08-31 ENCOUNTER — Encounter: Payer: Self-pay | Admitting: Pharmacist

## 2023-08-31 VITALS — BP 122/82 | HR 92

## 2023-08-31 DIAGNOSIS — I10 Essential (primary) hypertension: Secondary | ICD-10-CM | POA: Diagnosis not present

## 2023-08-31 MED ORDER — SILDENAFIL CITRATE 100 MG PO TABS: 100.0000 mg | ORAL_TABLET | Freq: Every day | ORAL | 2 refills | Status: DC | PRN

## 2023-08-31 NOTE — Progress Notes (Signed)
   S:     No chief complaint on file.  63 y.o. male who presents for hypertension evaluation, education, and management.   Patient was originally referred by Primary Care Provider, Rosaline Bohr, on 05/21/2023.   I saw him on 06/21/23 and BP was borderline low. We suspected dehydration and labs from 6/19 confirmed. Scr jumped from 1.43 prior to 2.10. We recommended PO hydration and for him to continue BP medications. Repeat creatinine on 06/28/23 was back to baseline at 1.45.   I saw him subsequently on 07/24/23. BP was elevated at that visit and we changed his lisinopril -hydrochlorothiazide  to valsartan -hydrochlorothiazide .   PMH is significant for T2DM, gout, HTN, PE and CVA.   Today, patient arrives in good spirits and presents without assistance. Denies dizziness and swelling. Patient reports hypertension is longstanding but home pressure readings have improved. He is taking the valsartan -hydrochlorothiazide  in addition to the amlodipine . =. Took both this morning.  Family/Social history: -Fhx: HTN (mother), DM (mother), CKD (father) -Prior EtOH use, discontinued ~ 1 week prior to CVA; never smoker, former marijuana smoker (quick after CVA)  Medication adherence seems optimal. 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  O:  Vitals:   08/31/23 1128  BP: 122/82  Pulse: 92    Last 3 Office BP readings: BP Readings from Last 3 Encounters:  08/31/23 122/82  08/13/23 130/88  08/09/23 128/80   BMET    Component Value Date/Time   NA 140 06/29/2023 1043   K 4.4 06/29/2023 1043   CL 102 06/29/2023 1043   CO2 23 06/29/2023 1043   GLUCOSE 138 (H)  06/29/2023 1043   GLUCOSE 207 (H) 06/06/2023 0624   BUN 15 06/29/2023 1043   CREATININE 1.45 (H) 06/29/2023 1043   CALCIUM  10.0 06/29/2023 1043   GFRNONAA 56 (L) 06/06/2023 0624   GFRAA 73 04/21/2019 1623    Renal function: CrCl cannot be calculated (Patient's most recent lab result is older than the maximum 21 days allowed.).  Clinical ASCVD: Yes  The ASCVD Risk score (Arnett DK, et al., 2019) failed to calculate for the following reasons:   Risk score cannot be calculated because patient has a medical history suggesting prior/existing ASCVD   A/P: Hypertension longstanding currently uncontrolled on current medications. BP goal < 130/80 mmHg. Medication adherence appears optimal. Renal function has now stabilized. We will change from lisinopril -hydrochlorothiazide  to valsartan -hydrochlorothiazide .  -Continue amlodipine  10 mg daily -DISCONTINUE lisinopril -hydrochlorothiazide . -START valsartan -hydrochlorothiazide  160-25 mg daily.  -Patient educated on purpose, proper use, and potential adverse effects of amlodipine , valsartan , and hydrochlorothiazide .  -F/u labs ordered - none today -Counseled on lifestyle modifications for blood pressure control including reduced dietary sodium, increased exercise, adequate sleep. -Encouraged patient to check BP at home and bring log of readings to next visit. Counseled on proper use of home BP cuff.   Results reviewed and written information provided.    Written patient instructions provided. Patient verbalized understanding of treatment plan.  Total time in face to face counseling 30 minutes.    Follow-up:  Pharmacist prn.  Herlene Fleeta Morris, PharmD, Edward Cabrera, CPP Clinical Pharmacist [facility] [phone]

## 2023-09-04 ENCOUNTER — Encounter: Payer: Self-pay | Admitting: Physician Assistant

## 2023-09-04 ENCOUNTER — Ambulatory Visit: Admitting: Physician Assistant

## 2023-09-04 ENCOUNTER — Other Ambulatory Visit (INDEPENDENT_AMBULATORY_CARE_PROVIDER_SITE_OTHER)

## 2023-09-04 DIAGNOSIS — G8929 Other chronic pain: Secondary | ICD-10-CM

## 2023-09-04 DIAGNOSIS — M25511 Pain in right shoulder: Secondary | ICD-10-CM

## 2023-09-04 MED ORDER — LIDOCAINE HCL 2 % IJ SOLN
2.0000 mL | INTRAMUSCULAR | Status: AC | PRN
  Administered 2023-09-04: 2 mL

## 2023-09-04 MED ORDER — METHYLPREDNISOLONE ACETATE 40 MG/ML IJ SUSP
40.0000 mg | INTRAMUSCULAR | Status: AC | PRN
  Administered 2023-09-04: 40 mg via INTRA_ARTICULAR

## 2023-09-04 MED ORDER — BUPIVACAINE HCL 0.25 % IJ SOLN
2.0000 mL | INTRAMUSCULAR | Status: AC | PRN
  Administered 2023-09-04: 2 mL via INTRA_ARTICULAR

## 2023-09-04 NOTE — Addendum Note (Signed)
 Addended by: JULE RONAL CROME on: 09/04/2023 09:42 AM   Modules accepted: Level of Service

## 2023-09-04 NOTE — Progress Notes (Addendum)
 Office Visit Note   Patient: Edward Cabrera           Date of Birth: 07/08/1960           MRN: [id number] Visit Date: 09/04/2023              Requested by: Edward Rosaline SQUIBB, NP [address] [place],  [place] [address] PCP: Edward Rosaline SQUIBB, NP   Assessment & Plan: Visit Diagnoses:  1. Chronic right shoulder pain     Plan: Impression is chronic right shoulder pain with underlying right upper extremity weakness from recent CVA.  I believe the patient is experiencing not only neuropathic pain but likely pain from underlying rotator cuff tendinitis.  We have discussed proceeding with subacromial cortisone injection today.  He will continue with PT as symptoms allow.  Follow-up with us  as needed.  Call with concerns or questions.  Follow-Up Instructions: Return if symptoms worsen or fail to improve.   Orders:  Orders Placed This Encounter  Procedures   Large Joint Inj: R subacromial bursa   XR Shoulder Right   Meds ordered this encounter  Medications   bupivacaine  (MARCAINE ) 0.25 % (with pres) injection 2 mL   lidocaine  (XYLOCAINE ) 2 % (with pres) injection 2 mL   methylPREDNISolone  acetate (DEPO-MEDROL ) injection 40 mg      Procedures: Large Joint Inj: R subacromial bursa on 09/04/2023 9:41 AM Indications: pain Details: 22 G needle Medications: 2 mL lidocaine  2 %; 2 mL bupivacaine  0.25 %; 40 mg methylPREDNISolone  acetate 40 MG/ML Outcome: tolerated well, no immediate complications Patient was prepped and draped in the usual sterile fashion.       Clinical Data: No additional findings.   Subjective: Chief Complaint  Patient presents with   Right Shoulder - Pain    HPI patient is a pleasant 63 year old gentleman who comes in today with right shoulder pain.  I initially saw him back in early June when he came in to see me with a wrist drop.  He was sent to the ED where he was diagnosed with a stroke.  He is here today to further discuss his right  shoulder.  He has been having pain primarily to the deltoid for several months which has progressively worsened.  He has associated popping and weakness likely as result of the CVA.  Any movement of the shoulder as well as sleeping on the right side seems to worsen his symptoms.  He has not taken anything for pain.  He does note that this painful shoulder is inhibiting him from progressing with PT following his stroke.  Review of Systems as detailed HPI.  All others reviewed and negative.   Objective: Vital Signs: There were no vitals taken for this visit.  Physical Exam well-developed well-nourished gentleman no acute distress.  Alert and oriented x 3.  Ortho Exam right shoulder exam: Forward flexion to approximately 75 degrees.  Abduction to about 80 degrees.  He can internally rotate to his back pocket.  He has pain with empty can testing.  3 out of 5 strength with resisted external rotation.  He does have moderate swelling to his right hand.  Specialty Comments:  No specialty comments available.  Imaging: XR Shoulder Right Result Date: 09/04/2023 Questionable calcification to the supraspinatus.  Type II acromion.    PMFS History: Patient Active Problem List   Diagnosis Date Noted   Type 2 diabetes mellitus with complication, without long-term current use of insulin  (HCC) 08/08/2023  LVH (left ventricular hypertrophy) 08/08/2023   Cardiomyopathy (HCC) 08/08/2023   Uncontrolled type 2 diabetes mellitus with hyperglycemia, without long-term current use of insulin  (HCC) 06/07/2023   Essential hypertension 06/07/2023   Hyperlipidemia 06/07/2023   Obesity (BMI 30.0-34.9) 06/07/2023   Acute CVA (cerebrovascular accident) (HCC) 06/05/2023   Past Medical History:  Diagnosis Date   Diabetes mellitus without complication (HCC)    Gout    Hypertension    Pulmonary embolism (HCC)     Family History  Problem Relation Age of Onset   Healthy Mother    Kidney failure Father 81    Diabetes Other     Past Surgical History:  Procedure Laterality Date   ROTATOR CUFF REPAIR Left    Social History   Occupational History   Not on file  Tobacco Use   Smoking status: Never   Smokeless tobacco: Never  Vaping Use   Vaping status: Never Used  Substance and Sexual Activity   Alcohol use: Not Currently   Drug use: Yes    Types: Marijuana   Sexual activity: Not on file

## 2023-09-05 ENCOUNTER — Ambulatory Visit: Attending: Internal Medicine | Admitting: Occupational Therapy

## 2023-09-05 ENCOUNTER — Encounter: Payer: Self-pay | Admitting: Occupational Therapy

## 2023-09-05 DIAGNOSIS — R278 Other lack of coordination: Secondary | ICD-10-CM | POA: Diagnosis present

## 2023-09-05 DIAGNOSIS — I69351 Hemiplegia and hemiparesis following cerebral infarction affecting right dominant side: Secondary | ICD-10-CM | POA: Diagnosis present

## 2023-09-05 DIAGNOSIS — R208 Other disturbances of skin sensation: Secondary | ICD-10-CM | POA: Insufficient documentation

## 2023-09-05 DIAGNOSIS — R600 Localized edema: Secondary | ICD-10-CM | POA: Insufficient documentation

## 2023-09-05 DIAGNOSIS — M25511 Pain in right shoulder: Secondary | ICD-10-CM | POA: Diagnosis present

## 2023-09-05 DIAGNOSIS — M6281 Muscle weakness (generalized): Secondary | ICD-10-CM | POA: Diagnosis present

## 2023-09-05 NOTE — Therapy (Signed)
 OUTPATIENT OCCUPATIONAL THERAPY NEURO TREATMENT   Patient Name: Edward Cabrera MRN: [id number] DOB:Jan 08, 1960, 63 y.o., male Today's Date: 09/05/2023  PCP: Celestia Rosaline SQUIBB, NP  REFERRING PROVIDER: Darci Pore, MD  END OF SESSION:  OT End of Session - 09/05/23 1025     Visit Number 15    Number of Visits 24    Date for OT Re-Evaluation 09/18/23    Authorization Type [employer], VL: 60 PT/OT/ST    OT Start Time 1015    OT Stop Time 1100    OT Time Calculation (min) 45 min    Activity Tolerance Patient tolerated treatment well    Behavior During Therapy WFL for tasks assessed/performed          Past Medical History:  Diagnosis Date   Diabetes mellitus without complication (HCC)    Gout    Hypertension    Pulmonary embolism (HCC)    Past Surgical History:  Procedure Laterality Date   ROTATOR CUFF REPAIR Left    Patient Active Problem List   Diagnosis Date Noted   Type 2 diabetes mellitus with complication, without long-term current use of insulin  (HCC) 08/08/2023   LVH (left ventricular hypertrophy) 08/08/2023   Cardiomyopathy (HCC) 08/08/2023   Uncontrolled type 2 diabetes mellitus with hyperglycemia, without long-term current use of insulin  (HCC) 06/07/2023   Essential hypertension 06/07/2023   Hyperlipidemia 06/07/2023   Obesity (BMI 30.0-34.9) 06/07/2023   Acute CVA (cerebrovascular accident) (HCC) 06/05/2023    ONSET DATE: 06/07/2023 (referral date)   REFERRING DIAG: I63.9 (ICD-10-CM) - Acute CVA (cerebrovascular accident) (HCC)  THERAPY DIAG:  Hemiplegia and hemiparesis following cerebral infarction affecting right dominant side (HCC)  Other lack of coordination  Acute pain of right shoulder  Localized edema  Other disturbances of skin sensation  Muscle weakness (generalized)  Rationale for Evaluation and Treatment: Rehabilitation  SUBJECTIVE:   SUBJECTIVE STATEMENT: I saw the orthopedic doctor yesterday. She gave me a cortisone  injection. So far, I think it's worse, not better. I think it's a rotator cuff tear.  I have pain at night b/t 8-10/10 and when I go to raise my arm even to put on my shirt, no pain at the moment (at rest).  Pt accompanied by: self  PERTINENT HISTORY: admitted to [facility] on 06/05/23 with 1 week hx of worsening RUE weakness, while in ED pt developed R facial droop and slurry speech. MRI  demonstrated L frontal and L occipital infarcts. PMH of PE, CKD,DM II, HTN.   PRECAUTIONS: Other: heart monitor and no heavy lifting  WEIGHT BEARING RESTRICTIONS: No  PAIN:  Are you having pain? Rt shoulder and hand/wrist 0-8/10 - worse when lifting arm  FALLS: Has patient fallen in last 6 months? Yes. Number of falls 1  LIVING ENVIRONMENT: Lives with: lives with their family Lives in: 1 story house with 3 steps to enter Has following equipment at home: Single point cane  PLOF: Independent, working full time as Mining engineer - requires lifting/pulling, but also typing  PATIENT GOALS: use of dominant arm and hand for self care and work  OBJECTIVE:  Note: Objective measures were completed at Evaluation unless otherwise noted.  HAND DOMINANCE: Right  ADLs: Overall ADLs: mod I to min assist Transfers/ambulation related to ADLs: independent Eating: eating with Lt non dominant hand with difficulty Grooming: with Lt non dominant hand with difficulty UB Dressing: mod I with difficulty - pull over shirts only LB Dressing: assist w/ Rt shoe and sock, wearing elastic pants b/c he  can't do buttons Toileting: mod I  Bathing: mod I w/ LH sponge Tub Shower transfers: mod I  Equipment: Long handled sponge and hand held shower  IADLs: Shopping: mod I  Light housekeeping: unable to fold towels or anything that requires 2 hands Meal Prep: dependent currently (pt loves to cook and did mostly prior to CVA)  Community mobility: pt stills driving Medication management: independent Handwriting: unable Rt dominant  hand, practicing w/ Lt hand  MOBILITY STATUS: Independent   FUNCTIONAL OUTCOME MEASURES: Upper Extremity Functional Scale (UEFS): 2/80 = .025 which translates to 99.075% disability RT dominant UE  UPPER EXTREMITY ROM:  minimal shoulder movement dominated by synergy pattern and no distal control. Approx 40% elbow flex, no supination, minimal trace wrist extension and 10% finger flex/ext   UPPER EXTREMITY MMT:   NOT TESTED D/T LIMITED MOVEMENT   HAND FUNCTION: No grip strength  COORDINATION: Pt can pick up block w/ difficulty if block is stabilized but cannot maintain 07/25/23: RUE box & blocks = 22   SENSATION: Light touch/localization intact, but still has numbness/tingling  EDEMA: min to mod Rt hand  MUSCLE TONE: RUE: Hypotonic  COGNITION: Overall cognitive status: Within functional limits for tasks assessed  VISION: Subjective report: At first my vision was blurry and I was seeing spots, but it's getting better. Denies diplopia Baseline vision: No visual deficits Visual history: none  VISION ASSESSMENT: Will further assess in functional context     PERCEPTION: Not tested  PRAXIS: Not tested  OBSERVATIONS: Pt with shoulder pain and movement emerging RUE although limited and dominated by synergy pattern                                                                                                                             TREATMENT DATE: 09/05/23   Pt had cortisone injection to Rt shoulder yesterday w/ no relief per pt report.   Fluidotherapy x 10 minutes for Rt hand to address pain, swelling, and stiffness. No adverse reactions.   BP 150/89  Pt shown how to don/doff shirt using hemi techniques to prevent Rt shoulder pain. Pt return demo with no pain  Prone: worked on scapula retraction, sh extension, and rhomboid ex to strengthen posterior shoulder girdle as pt is weak. Pt instructed to focus on these and shoulder flexion prone.   Pt unable to tolerate  any wt bearing over Rt wrist today even with modifications for hand placement - pt continues to have pain and pt suspects sprain  Retrograde massage to Rt hand and gentle passive wrist extension. Pt has no pain with gentle stretching, but has pain following increased passive wrist extension and unable to wt bear        PATIENT EDUCATION: Education details: see above Person educated: Patient Education method: Programmer, multimedia, Demonstration, Verbal cues, and Handouts Education comprehension: verbalized understanding, returned demonstration, verbal cues required, and needs further education  HOME EXERCISE PROGRAM: 06/20/23: initial HEP for RUE/shoulder 07/09/23: Additional HEP  for forearm, wrist, hand ROM and functional use/activities  of RUE 07/11/23: Additional HEP for RUE shoulder ROM 07/16/23: contrast bath 07/18/23: prone scapula HEP  07/25/23: finger tapping ex's, Rt handed typing words, tracing activities, wrist ext stretch HEP  08/08/23: updated coordination HEP    GOALS: Goals reviewed with patient? Yes  SHORT TERM GOALS: Target date: 07/20/23  Independent with initial HEP for RUE Baseline: Goal status: MET  2.  Pt to demo low level reaching RUE (approx 45* sh flex) in prep for functional retrieving of items from lower surfaces Baseline:  Goal status: MET (b/t 60-70*)   3.  Pt to demo 50% or greater gross composite flexion and extension Rt hand to grasp/release 1-2 cylindrical objects Baseline:  Goal status: MET   4.  Pt to begin using RUE consistently as min assist for BADLS and bilateral tasks Baseline:  Goal status: MET  5.  Pt to don Rt shoe and sock I'ly consistently with A/E prn Baseline:  Goal status: MET (Except laces)   6.  Pt to improve RUE function as evidenced by performing 27 blocks on Box & Blocks test Baseline: unable, 07/25/23: 22 blocks Goal status: MET (08/15/23 = 32 blocks)    LONG TERM GOALS: Target date: 09/18/23  Independent with updated HEP   Baseline:  Goal status: MET  2.  Pt to perform mid level reaching to 90* sh flexion to retrieve light weight objects Baseline:  Goal status: INITIAL  3.  Pt to demo RUE function improvements as evidenced by performing 38 blocks or greater on Box & Blocks test Baseline: Eval - unable, 07/25/23 = 22 blocks, 08/15/23 = 32 blocks Goal status: REVISED  4.  Pt to be perform cooking tasks safely with min assist or less using A/E prn Baseline:  Goal status: INITIAL  5.  Pt to demo 20 lbs or greater grip strength Rt hand in order to sufficiently hold objects in Rt hand Baseline: 0 lbs, 07/30/23 = 6 lbs Goal status: INITIAL  6.  Pt to demo sufficient coordination for typing 18 wpm at 90% accuracy or verbalize understanding of voice recognition software for work related tasks Baseline:  Goal status: INITIAL  7.  UEFI scale to improve by demo 70% or less disability RUE Baseline: 99.075% Goal status: INITIAL   ASSESSMENT:  CLINICAL IMPRESSION: Patient seen today for occupational therapy treatment for CVA w/ Rt dominant side hemiplegia. Pt limited by Rt shoulder pain w/ recent cortisone injection, Rt wrist pain, and continued edema Rt hand, however has met all STG's. Pt reaching plateau in re: to shoulder and may need to be d/c within 1-2 weeks. Pt may benefit from further orthopedic evaluation and MRI to shoulder to rule out rotator cuff tear.  SABRA   PERFORMANCE DEFICITS: in functional skills including ADLs, IADLs, coordination, dexterity, proprioception, sensation, edema, tone, ROM, strength, pain, flexibility, Fine motor control, Gross motor control, mobility, balance, body mechanics, endurance, cardiopulmonary status limiting function, decreased knowledge of use of DME, vision, and UE functional use, cognitive skills including safety awareness, and psychosocial skills including coping strategies.   IMPAIRMENTS: are limiting patient from ADLs, IADLs, rest and sleep, work, leisure, and social  participation.   CO-MORBIDITIES: has co-morbidities such as uncontrolled HTN that affects occupational performance. Patient will benefit from skilled OT to address above impairments and improve overall function.  MODIFICATION OR ASSISTANCE TO COMPLETE EVALUATION: No modification of tasks or assist necessary to complete an evaluation.  OT OCCUPATIONAL PROFILE AND HISTORY: Detailed assessment:  Review of records and additional review of physical, cognitive, psychosocial history related to current functional performance.  CLINICAL DECISION MAKING: Moderate - several treatment options, min-mod task modification necessary  REHAB POTENTIAL: Good  EVALUATION COMPLEXITY: Moderate    PLAN:  OT FREQUENCY: 2x/week  OT DURATION: 12 weeks  PLANNED INTERVENTIONS: 97535 self care/ADL training, 02889 therapeutic exercise, 97530 therapeutic activity, 97112 neuromuscular re-education, 97140 manual therapy, 97113 aquatic therapy, 97035 ultrasound, 97018 paraffin, 02960 fluidotherapy, 97010 moist heat, 97032 electrical stimulation (manual), 97014 electrical stimulation unattended, 97760 Orthotic Initial, 97763 Orthotic/Prosthetic subsequent, passive range of motion, functional mobility training, visual/perceptual remediation/compensation, energy conservation, coping strategies training, patient/family education, and DME and/or AE instructions  RECOMMENDED OTHER SERVICES: None at this time  CONSULTED AND AGREED WITH PLAN OF CARE: Patient  PLAN FOR NEXT SESSION: monitor BP, begin discussing progress towards LTG's and possible d/c from therapy in 1-2 weeks until further progress can be made to shoulder   Burnard JINNY Roads, OT 09/05/2023, 10:25 AM

## 2023-09-06 ENCOUNTER — Encounter (INDEPENDENT_AMBULATORY_CARE_PROVIDER_SITE_OTHER): Payer: Self-pay | Admitting: Primary Care

## 2023-09-06 ENCOUNTER — Other Ambulatory Visit: Payer: Self-pay | Admitting: Nephrology

## 2023-09-06 ENCOUNTER — Ambulatory Visit (INDEPENDENT_AMBULATORY_CARE_PROVIDER_SITE_OTHER): Admitting: Primary Care

## 2023-09-06 VITALS — BP 150/111 | HR 88 | Resp 19 | Ht 68.0 in | Wt 222.4 lb

## 2023-09-06 DIAGNOSIS — E119 Type 2 diabetes mellitus without complications: Secondary | ICD-10-CM

## 2023-09-06 DIAGNOSIS — I69351 Hemiplegia and hemiparesis following cerebral infarction affecting right dominant side: Secondary | ICD-10-CM | POA: Diagnosis not present

## 2023-09-06 DIAGNOSIS — N1831 Chronic kidney disease, stage 3a: Secondary | ICD-10-CM

## 2023-09-06 NOTE — Telephone Encounter (Signed)
 Issue resolved.

## 2023-09-06 NOTE — Telephone Encounter (Unsigned)
 Copied from CRM [id number]. Topic: General - Other >> Sep 06, 2023  9:43 AM DeAngela L wrote: Reason for CRM: patient calling about his  standard insurance short term disability company paper work, the patient states he will only get a $100 check on standard insurance short term disability paper work that was done by his provider. The patient will not go back to work till Dec 06, 2023 and said the  standard insurance short disability company said he will have to fill out more paper work cause his paper work was only for the month of August, and the patient has additional questions of how did this paper work was not completed for return in December cause he had 3 strokes and will be out till longer than August   Pt num [phone] (M)ok to leave a detailed message

## 2023-09-06 NOTE — Progress Notes (Signed)
 Needs paperwork to be reviewed and edited.

## 2023-09-09 ENCOUNTER — Encounter (INDEPENDENT_AMBULATORY_CARE_PROVIDER_SITE_OTHER): Payer: Self-pay | Admitting: Primary Care

## 2023-09-09 NOTE — Progress Notes (Signed)
 [facility]  Edward Cabrera, is a 63 y.o. male  RDW:[id number]  FMW:[id number]  DOB - January 07, 1960  No chief complaint on file.      Subjective:   Edward Cabrera is a 63 y.o. male here today for paper work . Patient has right side weakness. Hemiplegia and hemiparesis s/p cerebral infarction affecting right dominant side Followed by cardiology - continuous participation in OT.  Bp is elevated  -No headache, No chest pain, No abdominal pain - No Nausea, No new weakness tingling or numbness, No Cough - shortness of breath   No problems updated.  Comprehensive ROS Pertinent positive and negative noted in HPI   Allergies  Allergen Reactions   Iohexol      Code: HIVES, Desc: PT. WAS GIVEN 50 MG BENADRLY IV 30 MINUTES PRE-PROCEDURE W/ NO REACTION-----ARS 10-21-05, Onset Date: [id number]     Past Medical History:  Diagnosis Date   Diabetes mellitus without complication (HCC)    Gout    Hypertension    Pulmonary embolism (HCC)     Current Outpatient Medications on File Prior to Visit  Medication Sig Dispense Refill   amLODipine  (NORVASC ) 10 MG tablet Take 1 tablet (10 mg total) by mouth daily. 90 tablet 1   aspirin  EC 81 MG tablet Take 1 tablet (81 mg total) by mouth daily. Swallow whole. 30 tablet 12   atorvastatin  (LIPITOR ) 80 MG tablet Take 1 tablet (80 mg total) by mouth daily. 90 tablet 1   Blood Glucose Monitoring Suppl DEVI 1 each by Does not apply route in the morning, at noon, and at bedtime. May substitute to any manufacturer covered by patient's insurance. (Patient not taking: Reported on 08/14/2023) 1 each 0   clopidogrel  (PLAVIX ) 75 MG tablet Take 1 tablet (75 mg total) by mouth daily. 90 tablet 0   glimepiride  (AMARYL ) 2 MG tablet Take 1 tablet (2 mg total) by mouth every morning. 90 tablet 1   Omega-3 Fatty Acids (ULTRA OMEGA-3 FISH OIL ) 1400 MG CAPS Take 2,800 mg by mouth daily.     sildenafil  (VIAGRA ) 100 MG tablet Take 1 tablet (100 mg total) by mouth daily  as needed for erectile dysfunction. 10 tablet 2   valsartan -hydrochlorothiazide  (DIOVAN -HCT) 160-25 MG tablet Take 1 tablet by mouth daily. 90 tablet 1   No current facility-administered medications on file prior to visit.   Health Maintenance  Topic Date Due   Complete foot exam   Never done   Eye exam for diabetics  Never done   Pneumococcal Vaccine for age over 34 (1 of 2 - PCV) Never done   Zoster (Shingles) Vaccine (2 of 2) 04/06/2021   Flu Shot  08/03/2023   COVID-19 Vaccine (3 - 2025-26 season) 09/03/2023   Hemoglobin A1C  12/06/2023   Yearly kidney health urinalysis for diabetes  05/20/2024   Yearly kidney function blood test for diabetes  06/28/2024   Colon Cancer Screening  07/17/2030   DTaP/Tdap/Td vaccine (2 - Td or Tdap) 08/12/2030   Hepatitis C Screening  Completed   HIV Screening  Completed   Hepatitis B Vaccine  Aged Out   HPV Vaccine  Aged Out   Meningitis B Vaccine  Aged Out    Objective:   Vitals:   09/06/23 1009  BP: (!) 150/111  Pulse: 88  Resp: 19  SpO2: 100%  Weight: 222 lb 6.4 oz (100.9 kg)  Height: 5' 8 (1.727 m)   BP Readings from Last 3 Encounters:  09/06/23 (!) 150/111  08/31/23 122/82  08/13/23 130/88      Physical Exam Vitals reviewed.  Constitutional:      Appearance: He is obese.  HENT:     Head: Normocephalic.     Right Ear: External ear normal.     Left Ear: External ear normal.     Nose: Nose normal.  Eyes:     Extraocular Movements: Extraocular movements intact.  Cardiovascular:     Rate and Rhythm: Normal rate and regular rhythm.  Pulmonary:     Effort: Pulmonary effort is normal.     Breath sounds: Normal breath sounds.  Abdominal:     General: Bowel sounds are normal. There is distension.     Palpations: Abdomen is soft.  Musculoskeletal:     Cervical back: Normal range of motion and neck supple.     Comments: Right side weakness  Skin:    General: Skin is warm and dry.  Neurological:     Mental Status: Mental  status is at baseline.  Psychiatric:        Mood and Affect: Mood normal.        Behavior: Behavior normal.      Assessment & Plan  Diagnoses and all orders for this visit:  Hemiplegia and hemiparesis following cerebral infarction affecting right dominant side (HCC) Paper work completed for job cardiology    Type 2 diabetes mellitus without complication, without long-term current use of insulin  (HCC)  A1C 9.5 uncontrol   Complications from uncontrolled diabetes -diabetic retinopathy leading to blindness, diabetic nephropathy leading to dialysis, decrease in circulation decrease in sores or wound healing which may lead to amputations and increase of heart attack and stroke . Effects on the liver- non-alcoholic fatty liver disease (NAFLD), which can progress to non-alcoholic steatohepatitis (NASH), cirrhosis, liver failure, and even liver cancer , lead to fat accumulation in the liver, causing NAFLD, and if left untreated, can lead to more severe liver damage.   Patient have been counseled extensively about nutrition and exercise. Other issues discussed during this visit include: low cholesterol diet, weight control and daily exercise, foot care, annual eye examinations at Ophthalmology, importance of adherence with medications and regular follow-up. We also discussed long term complications of uncontrolled diabetes and hypertension.   Herlene first available   The patient was given clear instructions to go to ER or return to medical center if symptoms don't improve, worsen or new problems develop. The patient verbalized understanding. The patient was told to call to get lab results if they haven't heard anything in the next week.   This note has been created with Education officer, environmental. Any transcriptional errors are unintentional.   Rosaline SHAUNNA Bohr, NP 09/09/2023, 11:44 PM

## 2023-09-10 ENCOUNTER — Ambulatory Visit: Admitting: Occupational Therapy

## 2023-09-12 ENCOUNTER — Ambulatory Visit: Admitting: Occupational Therapy

## 2023-09-12 DIAGNOSIS — I69351 Hemiplegia and hemiparesis following cerebral infarction affecting right dominant side: Secondary | ICD-10-CM | POA: Diagnosis not present

## 2023-09-12 DIAGNOSIS — M6281 Muscle weakness (generalized): Secondary | ICD-10-CM

## 2023-09-12 DIAGNOSIS — M25511 Pain in right shoulder: Secondary | ICD-10-CM

## 2023-09-12 DIAGNOSIS — R208 Other disturbances of skin sensation: Secondary | ICD-10-CM

## 2023-09-12 DIAGNOSIS — R278 Other lack of coordination: Secondary | ICD-10-CM

## 2023-09-12 DIAGNOSIS — R600 Localized edema: Secondary | ICD-10-CM

## 2023-09-12 NOTE — Therapy (Signed)
 OUTPATIENT OCCUPATIONAL THERAPY NEURO TREATMENT   Patient Name: Edward Cabrera MRN: [id number] DOB:Oct 09, 1960, 63 y.o., male Today's Date: 09/12/2023  PCP: Celestia Rosaline SQUIBB, NP  REFERRING PROVIDER: Darci Pore, MD  END OF SESSION:  OT End of Session - 09/12/23 1021     Visit Number 16    Number of Visits 24    Date for OT Re-Evaluation 09/18/23    Authorization Type UHC, VL: 60 PT/OT/ST    OT Start Time 1020    OT Stop Time 1100    OT Time Calculation (min) 40 min    Activity Tolerance Patient tolerated treatment well    Behavior During Therapy WFL for tasks assessed/performed          Past Medical History:  Diagnosis Date   Diabetes mellitus without complication (HCC)    Gout    Hypertension    Pulmonary embolism (HCC)    Past Surgical History:  Procedure Laterality Date   ROTATOR CUFF REPAIR Left    Patient Active Problem List   Diagnosis Date Noted   Type 2 diabetes mellitus with complication, without long-term current use of insulin  (HCC) 08/08/2023   LVH (left ventricular hypertrophy) 08/08/2023   Cardiomyopathy (HCC) 08/08/2023   Uncontrolled type 2 diabetes mellitus with hyperglycemia, without long-term current use of insulin  (HCC) 06/07/2023   Essential hypertension 06/07/2023   Hyperlipidemia 06/07/2023   Obesity (BMI 30.0-34.9) 06/07/2023   Acute CVA (cerebrovascular accident) (HCC) 06/05/2023    ONSET DATE: 06/07/2023 (referral date)   REFERRING DIAG: I63.9 (ICD-10-CM) - Acute CVA (cerebrovascular accident) (HCC)  THERAPY DIAG:  Hemiplegia and hemiparesis following cerebral infarction affecting right dominant side (HCC)  Other lack of coordination  Acute pain of right shoulder  Localized edema  Other disturbances of skin sensation  Muscle weakness (generalized)  Rationale for Evaluation and Treatment: Rehabilitation  SUBJECTIVE:   SUBJECTIVE STATEMENT: Still no improvement in my shoulder from the cortisone injection Pt  accompanied by: self  PERTINENT HISTORY: admitted to [facility] on 06/05/23 with 1 week hx of worsening RUE weakness, while in ED pt developed R facial droop and slurry speech. MRI  demonstrated L frontal and L occipital infarcts. PMH of PE, CKD,DM II, HTN.   PRECAUTIONS: Other: heart monitor and no heavy lifting  WEIGHT BEARING RESTRICTIONS: No  PAIN:  Are you having pain? Rt shoulder and hand/wrist 0-8/10 - worse when lifting arm  FALLS: Has patient fallen in last 6 months? Yes. Number of falls 1  LIVING ENVIRONMENT: Lives with: lives with their family Lives in: 1 story house with 3 steps to enter Has following equipment at home: Single point cane  PLOF: Independent, working full time as Mining engineer - requires lifting/pulling, but also typing  PATIENT GOALS: use of dominant arm and hand for self care and work  OBJECTIVE:  Note: Objective measures were completed at Evaluation unless otherwise noted.  HAND DOMINANCE: Right  ADLs: Overall ADLs: mod I to min assist Transfers/ambulation related to ADLs: independent Eating: eating with Lt non dominant hand with difficulty Grooming: with Lt non dominant hand with difficulty UB Dressing: mod I with difficulty - pull over shirts only LB Dressing: assist w/ Rt shoe and sock, wearing elastic pants b/c he can't do buttons Toileting: mod I  Bathing: mod I w/ LH sponge Tub Shower transfers: mod I  Equipment: Long handled sponge and hand held shower  IADLs: Shopping: mod I  Light housekeeping: unable to fold towels or anything that requires 2 hands Meal Prep:  dependent currently (pt loves to cook and did mostly prior to CVA)  Community mobility: pt stills driving Medication management: independent Handwriting: unable Rt dominant hand, practicing w/ Lt hand  MOBILITY STATUS: Independent   FUNCTIONAL OUTCOME MEASURES: Upper Extremity Functional Scale (UEFS): 2/80 = .025 which translates to 99.075% disability RT dominant UE  UPPER  EXTREMITY ROM:  minimal shoulder movement dominated by synergy pattern and no distal control. Approx 40% elbow flex, no supination, minimal trace wrist extension and 10% finger flex/ext   UPPER EXTREMITY MMT:   NOT TESTED D/T LIMITED MOVEMENT   HAND FUNCTION: No grip strength  COORDINATION: Pt can pick up block w/ difficulty if block is stabilized but cannot maintain 07/25/23: RUE box & blocks = 22 blocks,   09/12/23 = 36 blocks 09/12/23: 9 hole peg test Rt = 65.34 sec, then 41.37 sec  SENSATION: Light touch/localization intact, but still has numbness/tingling  EDEMA: min to mod Rt hand  MUSCLE TONE: RUE: Hypotonic  COGNITION: Overall cognitive status: Within functional limits for tasks assessed  VISION: Subjective report: At first my vision was blurry and I was seeing spots, but it's getting better. Denies diplopia Baseline vision: No visual deficits Visual history: none  VISION ASSESSMENT: Will further assess in functional context     PERCEPTION: Not tested  PRAXIS: Not tested  OBSERVATIONS: Pt with shoulder pain and movement emerging RUE although limited and dominated by synergy pattern                                                                                                                             TREATMENT DATE: 09/12/23   Discussion with patient re: d/c next week due limitations continued in Rt shoulder and wrist. Recommended pt return to orthopedic MD to further evaluation shoulder and wrist, and possibly get an MRI. Explained that we will d/c and take a break from therapy until further progress can be made with RUE. Pt agreeable and has several HEP's to work on for RUE including: ROM, posterior sh strengthening, fine motor coordination, grip strength, etc.   Further assessed progress towards LTG's in prep for d/c next week: see below goal section.  Pt did improve in Rt grip strength and functional use RUE with Box & Blocks but did not quite meet goals.  However, no further improvements in shoulder ROM for open chain reaching  Pt placing medium sized pegs in pegboard Rt hand for coordination and functional use w/ min difficulty and extra time. Then removing pegs attempting in hand manipulation with max difficulty and drops. Simplified to taking out one at a time for pincer grasp  Assessed 9 hole peg test for the first time Rt hand! - see above. Pt improved 2nd trial  Attempted closed chain movements RUE holding ball against wall, but limited by wrist extension/pain, edema in hand, and not achieving full elbow extension therefore d/c   UBE x 5 min, level 3 for normal reciprocal movement pattern  and UB conditioning    PATIENT EDUCATION: Education details: see above Person educated: Patient Education method: Explanation, Demonstration, Verbal cues, and Handouts Education comprehension: verbalized understanding, returned demonstration, verbal cues required, and needs further education  HOME EXERCISE PROGRAM: 06/20/23: initial HEP for RUE/shoulder 07/09/23: Additional HEP for forearm, wrist, hand ROM and functional use/activities  of RUE 07/11/23: Additional HEP for RUE shoulder ROM 07/16/23: contrast bath 07/18/23: prone scapula HEP  07/25/23: finger tapping ex's, Rt handed typing words, tracing activities, wrist ext stretch HEP  08/08/23: updated coordination HEP    GOALS: Goals reviewed with patient? Yes  SHORT TERM GOALS: Target date: 07/20/23  Independent with initial HEP for RUE Baseline: Goal status: MET  2.  Pt to demo low level reaching RUE (approx 45* sh flex) in prep for functional retrieving of items from lower surfaces Baseline:  Goal status: MET (b/t 60-70*)   3.  Pt to demo 50% or greater gross composite flexion and extension Rt hand to grasp/release 1-2 cylindrical objects Baseline:  Goal status: MET   4.  Pt to begin using RUE consistently as min assist for BADLS and bilateral tasks Baseline:  Goal status: MET  5.   Pt to don Rt shoe and sock I'ly consistently with A/E prn Baseline:  Goal status: MET (Except laces)   6.  Pt to improve RUE function as evidenced by performing 27 blocks on Box & Blocks test Baseline: unable, 07/25/23: 22 blocks Goal status: MET (08/15/23 = 32 blocks)    LONG TERM GOALS: Target date: 09/18/23  Independent with updated HEP  Baseline:  Goal status: MET  2.  Pt to perform mid level reaching to 90* sh flexion to retrieve light weight objects Baseline:  Goal status: NOT MET (09/12/23: approx 70*)   3.  Pt to demo RUE function improvements as evidenced by performing 38 blocks or greater on Box & Blocks test Baseline: Eval - unable, 07/25/23 = 22 blocks, 08/15/23 = 32 blocks, 09/12/23 = 36 Goal status: REVISED  4.  Pt to be perform cooking tasks safely with min assist or less using A/E prn Baseline:  Goal status: MET   5.  Pt to demo 20 lbs or greater grip strength Rt hand in order to sufficiently hold objects in Rt hand Baseline: 0 lbs, 07/30/23 = 6 lbs, 09/12/23 = 16 lbs Goal status: IN PROGRESS  6.  Pt to demo sufficient coordination for typing 18 wpm at 90% accuracy or verbalize understanding of voice recognition software for work related tasks Baseline:  Goal status: NOT MET   7.  UEFI scale to improve by demo 70% or less disability RUE Baseline: 99.075% Goal status: INITIAL   ASSESSMENT:  CLINICAL IMPRESSION: Patient seen today for occupational therapy treatment for CVA w/ Rt dominant side hemiplegia. Pt limited by Rt shoulder pain w/ recent cortisone injection, Rt wrist pain, and continued edema Rt hand. Pt has improved in Rt grip strength and coordination, but shoulder and wrist w/ no improvements. Pt may benefit from further orthopedic evaluation and MRI to shoulder to rule out rotator cuff tear.  SABRA   PERFORMANCE DEFICITS: in functional skills including ADLs, IADLs, coordination, dexterity, proprioception, sensation, edema, tone, ROM, strength, pain,  flexibility, Fine motor control, Gross motor control, mobility, balance, body mechanics, endurance, cardiopulmonary status limiting function, decreased knowledge of use of DME, vision, and UE functional use, cognitive skills including safety awareness, and psychosocial skills including coping strategies.   IMPAIRMENTS: are limiting patient from ADLs, IADLs, rest and  sleep, work, leisure, and social participation.   CO-MORBIDITIES: has co-morbidities such as uncontrolled HTN that affects occupational performance. Patient will benefit from skilled OT to address above impairments and improve overall function.  MODIFICATION OR ASSISTANCE TO COMPLETE EVALUATION: No modification of tasks or assist necessary to complete an evaluation.  OT OCCUPATIONAL PROFILE AND HISTORY: Detailed assessment: Review of records and additional review of physical, cognitive, psychosocial history related to current functional performance.  CLINICAL DECISION MAKING: Moderate - several treatment options, min-mod task modification necessary  REHAB POTENTIAL: Good  EVALUATION COMPLEXITY: Moderate    PLAN:  OT FREQUENCY: 2x/week  OT DURATION: 12 weeks  PLANNED INTERVENTIONS: 97535 self care/ADL training, 02889 therapeutic exercise, 97530 therapeutic activity, 97112 neuromuscular re-education, 97140 manual therapy, 97113 aquatic therapy, 97035 ultrasound, 97018 paraffin, 02960 fluidotherapy, 97010 moist heat, 97032 electrical stimulation (manual), 97014 electrical stimulation unattended, 97760 Orthotic Initial, 97763 Orthotic/Prosthetic subsequent, passive range of motion, functional mobility training, visual/perceptual remediation/compensation, energy conservation, coping strategies training, patient/family education, and DME and/or AE instructions  RECOMMENDED OTHER SERVICES: None at this time  CONSULTED AND AGREED WITH PLAN OF CARE: Patient  PLAN FOR NEXT SESSION: monitor BP, assess remaining goals (especially #7)   and d/c next visit   Burnard JINNY Roads, OT 09/12/2023, 10:23 AM

## 2023-09-13 ENCOUNTER — Other Ambulatory Visit

## 2023-09-14 ENCOUNTER — Other Ambulatory Visit: Payer: Self-pay | Admitting: *Deleted

## 2023-09-14 NOTE — Patient Instructions (Signed)
 Edward Cabrera - I am sorry I was unable to reach you today for our scheduled appointment. I work with Celestia Rosaline SQUIBB, NP and am calling to support your healthcare needs. I have rescheduled appointment for 09/26/24 @ 2 pm. Please contact me at [phone] if you need to reschedule or have any needs.  I look forward to speaking with you soon.   Thank you,   Ardis Fullwood, RN, BSN, ACM RN Care Manager [employer] [phone]

## 2023-09-17 ENCOUNTER — Ambulatory Visit: Admitting: Occupational Therapy

## 2023-09-18 ENCOUNTER — Ambulatory Visit: Admitting: Occupational Therapy

## 2023-09-18 ENCOUNTER — Encounter: Payer: Self-pay | Admitting: Occupational Therapy

## 2023-09-18 DIAGNOSIS — R278 Other lack of coordination: Secondary | ICD-10-CM

## 2023-09-18 DIAGNOSIS — I69351 Hemiplegia and hemiparesis following cerebral infarction affecting right dominant side: Secondary | ICD-10-CM

## 2023-09-18 DIAGNOSIS — M25511 Pain in right shoulder: Secondary | ICD-10-CM

## 2023-09-18 NOTE — Therapy (Signed)
 OUTPATIENT OCCUPATIONAL THERAPY NEURO TREATMENT   Patient Name: Edward Cabrera MRN: [id number] DOB:01-Oct-1960, 63 y.o., male Today's Date: 09/18/2023  PCP: Celestia Rosaline SQUIBB, NP  REFERRING PROVIDER: Darci Pore, MD   OCCUPATIONAL THERAPY DISCHARGE SUMMARY  Visits from Start of Care: 17  Current functional level related to goals / functional outcomes: See below for goals   Remaining deficits: Rt shoulder pain and decreased ROM Rt wrist pain Mild edema Rt hand (improved)  Coordination   Education / Equipment: See below for extensive amount of HEP's/education   Patient agrees to discharge. Patient goals were partially met. Patient is being discharged due to continued Rt shoulder pain w/ possible rotator cuff tear - pt seeing orthopedist soon. Pt may benefit from further O.T. in the future once shoulder is addressed..     END OF SESSION:  OT End of Session - 09/18/23 0849     Visit Number 17    Number of Visits 24    Date for OT Re-Evaluation 09/18/23    Authorization Type UHC, VL: 60 PT/OT/ST    OT Start Time 0848    OT Stop Time 0930    OT Time Calculation (min) 42 min    Activity Tolerance Patient tolerated treatment well    Behavior During Therapy WFL for tasks assessed/performed          Past Medical History:  Diagnosis Date   Diabetes mellitus without complication (HCC)    Gout    Hypertension    Pulmonary embolism (HCC)    Past Surgical History:  Procedure Laterality Date   ROTATOR CUFF REPAIR Left    Patient Active Problem List   Diagnosis Date Noted   Type 2 diabetes mellitus with complication, without long-term current use of insulin  (HCC) 08/08/2023   LVH (left ventricular hypertrophy) 08/08/2023   Cardiomyopathy (HCC) 08/08/2023   Uncontrolled type 2 diabetes mellitus with hyperglycemia, without long-term current use of insulin  (HCC) 06/07/2023   Essential hypertension 06/07/2023   Hyperlipidemia 06/07/2023   Obesity (BMI  30.0-34.9) 06/07/2023   Acute CVA (cerebrovascular accident) (HCC) 06/05/2023    ONSET DATE: 06/07/2023 (referral date)   REFERRING DIAG: I63.9 (ICD-10-CM) - Acute CVA (cerebrovascular accident) (HCC)  THERAPY DIAG:  Hemiplegia and hemiparesis following cerebral infarction affecting right dominant side (HCC)  Other lack of coordination  Acute pain of right shoulder  Rationale for Evaluation and Treatment: Rehabilitation  SUBJECTIVE:   SUBJECTIVE STATEMENT: Shoulder pain is still the same. I see the ortho doctor tomorrow Pt accompanied by: self  PERTINENT HISTORY: admitted to [facility] on 06/05/23 with 1 week hx of worsening RUE weakness, while in ED pt developed R facial droop and slurry speech. MRI  demonstrated L frontal and L occipital infarcts. PMH of PE, CKD,DM II, HTN.   PRECAUTIONS: Other: heart monitor and no heavy lifting  WEIGHT BEARING RESTRICTIONS: No  PAIN:  Are you having pain? Rt shoulder and hand/wrist 0-8/10 - worse when lifting arm  FALLS: Has patient fallen in last 6 months? Yes. Number of falls 1  LIVING ENVIRONMENT: Lives with: lives with their family Lives in: 1 story house with 3 steps to enter Has following equipment at home: Single point cane  PLOF: Independent, working full time as Mining engineer - requires lifting/pulling, but also typing  PATIENT GOALS: use of dominant arm and hand for self care and work  OBJECTIVE:  Note: Objective measures were completed at Evaluation unless otherwise noted.  HAND DOMINANCE: Right  ADLs: Overall ADLs: mod I to  min assist Transfers/ambulation related to ADLs: independent Eating: eating with Lt non dominant hand with difficulty Grooming: with Lt non dominant hand with difficulty UB Dressing: mod I with difficulty - pull over shirts only LB Dressing: assist w/ Rt shoe and sock, wearing elastic pants b/c he can't do buttons Toileting: mod I  Bathing: mod I w/ LH sponge Tub Shower transfers: mod I   Equipment: Long handled sponge and hand held shower  IADLs: Shopping: mod I  Light housekeeping: unable to fold towels or anything that requires 2 hands Meal Prep: dependent currently (pt loves to cook and did mostly prior to CVA)  Community mobility: pt stills driving Medication management: independent Handwriting: unable Rt dominant hand, practicing w/ Lt hand  MOBILITY STATUS: Independent   FUNCTIONAL OUTCOME MEASURES: Upper Extremity Functional Scale (UEFS): 2/80 = .025 which translates to 99.075% disability RT dominant UE  UPPER EXTREMITY ROM:  minimal shoulder movement dominated by synergy pattern and no distal control. Approx 40% elbow flex, no supination, minimal trace wrist extension and 10% finger flex/ext   UPPER EXTREMITY MMT:   NOT TESTED D/T LIMITED MOVEMENT   HAND FUNCTION: No grip strength  COORDINATION: Pt can pick up block w/ difficulty if block is stabilized but cannot maintain 07/25/23: RUE box & blocks = 22 blocks,   09/12/23 = 36 blocks 09/12/23: 9 hole peg test Rt = 65.34 sec, then 41.37 sec  SENSATION: Light touch/localization intact, but still has numbness/tingling  EDEMA: min to mod Rt hand  MUSCLE TONE: RUE: Hypotonic  COGNITION: Overall cognitive status: Within functional limits for tasks assessed  VISION: Subjective report: At first my vision was blurry and I was seeing spots, but it's getting better. Denies diplopia Baseline vision: No visual deficits Visual history: none  VISION ASSESSMENT: Will further assess in functional context     PERCEPTION: Not tested  PRAXIS: Not tested  OBSERVATIONS: Pt with shoulder pain and movement emerging RUE although limited and dominated by synergy pattern                                                                                                                             TREATMENT DATE: 09/18/23  BP = 141/86, HR = 87  Re-assessed Box & Blocks and grip strength w/ improvements since last  wee - see below goal section. UEFI = 66.25% RUE deficit  Pt placing small pegs in pegboard Rt hand for coordination w/ mod difficulty, copying design for visual/perceptual skills w/ mod errors and therapist assist to initially correct design, and then again required cues for diagonal pattern. Pt did not finish design d/t fatigue  UBE x 5 min, level 5 for normal reciprocal movement pattern and UB conditioning   PATIENT EDUCATION: Education details: see above Person educated: Patient Education method: Explanation, Demonstration, Verbal cues, and Handouts Education comprehension: verbalized understanding, returned demonstration, verbal cues required, and needs further education  HOME EXERCISE PROGRAM: 06/20/23: initial HEP for RUE/shoulder 07/09/23: Additional  HEP for forearm, wrist, hand ROM and functional use/activities  of RUE 07/11/23: Additional HEP for RUE shoulder ROM 07/16/23: contrast bath 07/18/23: prone scapula HEP  07/25/23: finger tapping ex's, Rt handed typing words, tracing activities, wrist ext stretch HEP  08/08/23: updated coordination HEP    GOALS: Goals reviewed with patient? Yes  SHORT TERM GOALS: Target date: 07/20/23  Independent with initial HEP for RUE Baseline: Goal status: MET  2.  Pt to demo low level reaching RUE (approx 45* sh flex) in prep for functional retrieving of items from lower surfaces Baseline:  Goal status: MET (b/t 60-70*)   3.  Pt to demo 50% or greater gross composite flexion and extension Rt hand to grasp/release 1-2 cylindrical objects Baseline:  Goal status: MET   4.  Pt to begin using RUE consistently as min assist for BADLS and bilateral tasks Baseline:  Goal status: MET  5.  Pt to don Rt shoe and sock I'ly consistently with A/E prn Baseline:  Goal status: MET (Except laces)   6.  Pt to improve RUE function as evidenced by performing 27 blocks on Box & Blocks test Baseline: unable, 07/25/23: 22 blocks Goal status: MET (08/15/23 = 32  blocks)    LONG TERM GOALS: Target date: 09/18/23  Independent with updated HEP  Baseline:  Goal status: MET  2.  Pt to perform mid level reaching to 90* sh flexion to retrieve light weight objects Baseline:  Goal status: NOT MET (09/12/23: approx 70*)   3.  Pt to demo RUE function improvements as evidenced by performing 38 blocks or greater on Box & Blocks test Baseline: Eval - unable, 07/25/23 = 22 blocks, 08/15/23 = 32 blocks, 09/12/23 = 36, 09/18/23 = 39  Goal status: MET   4.  Pt to be perform cooking tasks safely with min assist or less using A/E prn Baseline:  Goal status: MET   5.  Pt to demo 20 lbs or greater grip strength Rt hand in order to sufficiently hold objects in Rt hand Baseline: 0 lbs, 07/30/23 = 6 lbs, 09/12/23 = 16 lbs, 09/18/23 = 26 lbs Goal status: MET  6.  Pt to demo sufficient coordination for typing 18 wpm at 90% accuracy or verbalize understanding of voice recognition software for work related tasks Baseline:  Goal status: NOT MET   7.  UEFI scale to improve by demo 70% or less disability RUE Baseline: 99.075% Goal status: MET (66.25% Deficit RUE)    ASSESSMENT:  CLINICAL IMPRESSION: Patient has met all STG's and 5/7 LTGs. Pt has improved significantly in RUE function, coordination, and grip strength. Pt continues to be limited by Rt shoulder and wrist pain. Pt to see orthopedist tomorrow    PERFORMANCE DEFICITS: in functional skills including ADLs, IADLs, coordination, dexterity, proprioception, sensation, edema, tone, ROM, strength, pain, flexibility, Fine motor control, Gross motor control, mobility, balance, body mechanics, endurance, cardiopulmonary status limiting function, decreased knowledge of use of DME, vision, and UE functional use, cognitive skills including safety awareness, and psychosocial skills including coping strategies.   IMPAIRMENTS: are limiting patient from ADLs, IADLs, rest and sleep, work, leisure, and social participation.    CO-MORBIDITIES: has co-morbidities such as uncontrolled HTN that affects occupational performance. Patient will benefit from skilled OT to address above impairments and improve overall function.  MODIFICATION OR ASSISTANCE TO COMPLETE EVALUATION: No modification of tasks or assist necessary to complete an evaluation.  OT OCCUPATIONAL PROFILE AND HISTORY: Detailed assessment: Review of records and additional review of  physical, cognitive, psychosocial history related to current functional performance.  CLINICAL DECISION MAKING: Moderate - several treatment options, min-mod task modification necessary  REHAB POTENTIAL: Good  EVALUATION COMPLEXITY: Moderate    PLAN:  OT FREQUENCY: 2x/week  OT DURATION: 12 weeks  PLANNED INTERVENTIONS: 97535 self care/ADL training, 02889 therapeutic exercise, 97530 therapeutic activity, 97112 neuromuscular re-education, 97140 manual therapy, 97113 aquatic therapy, 97035 ultrasound, 97018 paraffin, 02960 fluidotherapy, 97010 moist heat, 97032 electrical stimulation (manual), 97014 electrical stimulation unattended, 97760 Orthotic Initial, 97763 Orthotic/Prosthetic subsequent, passive range of motion, functional mobility training, visual/perceptual remediation/compensation, energy conservation, coping strategies training, patient/family education, and DME and/or AE instructions  RECOMMENDED OTHER SERVICES: None at this time  CONSULTED AND AGREED WITH PLAN OF CARE: Patient  PLAN  D/C O.T.  - Pt may benefit from return to O.T. in a few months if Rt shoulder pain improves  Burnard JINNY Roads, OT 09/18/2023, 8:49 AM

## 2023-09-19 ENCOUNTER — Encounter: Payer: Self-pay | Admitting: Physician Assistant

## 2023-09-19 ENCOUNTER — Ambulatory Visit: Admitting: Physician Assistant

## 2023-09-19 ENCOUNTER — Ambulatory Visit: Admitting: Occupational Therapy

## 2023-09-19 ENCOUNTER — Telehealth: Payer: Self-pay

## 2023-09-19 DIAGNOSIS — M25511 Pain in right shoulder: Secondary | ICD-10-CM | POA: Diagnosis not present

## 2023-09-19 DIAGNOSIS — G8929 Other chronic pain: Secondary | ICD-10-CM

## 2023-09-19 NOTE — Progress Notes (Signed)
 Office Visit Note   Patient: Edward Cabrera           Date of Birth: [birth year]           MRN: [id number] Visit Date: 09/19/2023              Requested by: Celestia Rosaline SQUIBB, NP [address] [place],  [place] [address] PCP: Celestia Rosaline SQUIBB, NP   Assessment & Plan: Visit Diagnoses:  1. Chronic right shoulder pain     Plan: Impression is chronic right shoulder pain.  I think some of this is likely neuropathic but could also have rotator cuff pathology.  He has been in formal physical therapy for several months and is no longer progressing due to the pain.  Subacromial cortisone injection did not provide relief.  We will order an MRI of the right shoulder to assess for structural abnormalities.  He will follow-up with us  once this is completed.  Call with concerns or questions.  Follow-Up Instructions: Return for after MRI.   Orders:  No orders of the defined types were placed in this encounter.  No orders of the defined types were placed in this encounter.     Procedures: No procedures performed   Clinical Data: No additional findings.   Subjective: Chief Complaint  Patient presents with   Right Shoulder - Follow-up    HPI patient is a poor 63 year old gentleman who comes in today with continued right shoulder pain.  He was initially seen by me in early June of this year where he had significant right upper extremity weakness.  He was sent to the ED where he was diagnosed with a CVA.  He has underlying right sided hemiparesis as a result of this.  He saw me again a few weeks ago where I proceeded with subacromial cortisone injection.  He denies any relief following the injection.  He has been in physical therapy for few months now but has recently had to stop due to the increased pain in the right shoulder.  The majority of his pain is to the entire shoulder but with some radiation into the deltoid.  Pain is worse with forward flexion.  He has associated  popping and weakness.  He is not taking anything for pain.  Review of Systems as detailed in HPI.  All others reviewed and are negative.   Objective: Vital Signs: There were no vitals taken for this visit.  Physical Exam well-developed well-nourished gentleman in no acute distress.  Alert and oriented x 3.  Ortho Exam right shoulder exam: Forward flexion to approximately 70 degrees.  I can actively get about 80 degrees.  Abduction to approximate 100 degrees internal rotation to his back pocket.  4 out of 5 strength with resisted external rotation.  Specialty Comments:  No specialty comments available.  Imaging: No new imaging   PMFS History: Patient Active Problem List   Diagnosis Date Noted   Type 2 diabetes mellitus with complication, without long-term current use of insulin  (HCC) 08/08/2023   LVH (left ventricular hypertrophy) 08/08/2023   Cardiomyopathy (HCC) 08/08/2023   Uncontrolled type 2 diabetes mellitus with hyperglycemia, without long-term current use of insulin  (HCC) 06/07/2023   Essential hypertension 06/07/2023   Hyperlipidemia 06/07/2023   Obesity (BMI 30.0-34.9) 06/07/2023   Acute CVA (cerebrovascular accident) (HCC) 06/05/2023   Past Medical History:  Diagnosis Date   Diabetes mellitus without complication (HCC)    Gout    Hypertension    Pulmonary  embolism (HCC)     Family History  Problem Relation Age of Onset   Healthy Mother    Kidney failure Father 63   Diabetes Other     Past Surgical History:  Procedure Laterality Date   ROTATOR CUFF REPAIR Left    Social History   Occupational History   Not on file  Tobacco Use   Smoking status: Never   Smokeless tobacco: Never  Vaping Use   Vaping status: Never Used  Substance and Sexual Activity   Alcohol use: Not Currently   Drug use: Yes    Types: Marijuana   Sexual activity: Not on file

## 2023-09-19 NOTE — Telephone Encounter (Signed)
 Travia Benjamin,RN and I called the patient to discuss what is needed to complete the short term disability paperwork that was received at [facility].    He stated that he has been in contact with [employer] and they are missing paperwork for his disability claim. He said he is supposed to be out of work until 12/06/2023.  I told him that I do not see that reflected in the medical record and I will need to call [employer] tomorrow to clarify what is needed to complete his claim. He is aware that his PCP is out of the office through 10/02/2023.   I told him that the insurance company may require that he be on the line during the conversation and I will need to call him so we can conference call.  He said that was fine.    I confirmed that I have the phone number for [employer] and he gave me their email: [email or web address]

## 2023-09-19 NOTE — Addendum Note (Signed)
 Addended by: Azai Gaffin on: 09/19/2023 11:11 AM   Modules accepted: Orders

## 2023-09-20 ENCOUNTER — Telehealth: Payer: Self-pay | Admitting: Cardiology

## 2023-09-20 ENCOUNTER — Other Ambulatory Visit: Payer: Self-pay | Admitting: Orthopaedic Surgery

## 2023-09-20 NOTE — Telephone Encounter (Signed)
 Pt dropped off ST-Dis paperwork [employer]) & completed ROI & Billing forms. Will pay $29 fee (cash, check, or money order) later today.  Pt made aware of 7-14 business day turnaround for paperwork completion & aware that Dr. Lavona will return to office next week.  Paperwork left in Dr. [employer].   JB, 09-20-23

## 2023-09-20 NOTE — Telephone Encounter (Signed)
 I called [employer] : [phone] and spoke to [facility] and conference called with the patient.    Edward Cabrera explained that they received the Attending Physician's Statement from [place] but they still need the Physician's report - Cardiac and Physician's report- Equilibrium.  I informed her that the PCP is out of the office on FMLA and will not be able to compete any additional documentation.   Edward Cabrera explained to the patient that he needs to have the cardiologist complete the Cardiac Report and the Equilibrium report.  If the cardiologist is not going to complete the Equilibrium report and just complete the cardiac report, then she said they should just submit the cardiac report.  She then informed the patient that he needs to call [employer] 5 business days after the Cardiac report is submitted and the examiner will let him know if the documentation that has been submitted is sufficient or if additional information/ documentation/reports/ visit notes are needed. She said that the examiner will discuss the status of the claim with the patient at that time. The patient did not have any questions.   I then called the patient and reviewed our conversation with Edward Cabrera.  He said he has the blank documents that need to be completed by cardiology. He last saw Dr Lavona and  I gave him Dr Hochrein's office address. He spoke about his frustration with trying to get everything submitted and how this has held up his receiving his full disability checks. I allowed him to verbalize his frustrations and then reviewed his next steps- take the documents to cardiology and ask them to let him know when the documents are submitted to so he can call [employer] 5 business days later.  He said he understood and did not have any questions at this time.  I told him to please call me if he has any further questions.

## 2023-09-20 NOTE — Telephone Encounter (Unsigned)
 Copied from CRM [id number]. Topic: Clinical - Medical Advice >> Sep 17, 2023  9:12 AM Anairis L wrote: Reason for CRM: Patient is calling because his paperwork has not been emailed or fax back to [employer]. He has been reaching out and has not had a call back.

## 2023-09-20 NOTE — Telephone Encounter (Signed)
 Copied from CRM [id number]. Topic: General - Call Back - No Documentation >> Sep 18, 2023 11:33 AM Avram MATSU wrote: Reason for CRM: patient would like a call back to discuss short tem disability paperwork. Please advise [phone])

## 2023-09-21 ENCOUNTER — Ambulatory Visit
Admission: RE | Admit: 2023-09-21 | Discharge: 2023-09-21 | Disposition: A | Discharge status: Home / Self Care | Source: Ambulatory Visit | Attending: Orthopaedic Surgery | Admitting: Orthopaedic Surgery

## 2023-09-21 DIAGNOSIS — G8929 Other chronic pain: Secondary | ICD-10-CM

## 2023-09-21 NOTE — Telephone Encounter (Signed)
 Pt paid $29 fee (cash).  JB, 09-21-23

## 2023-09-27 ENCOUNTER — Other Ambulatory Visit: Payer: Self-pay | Admitting: *Deleted

## 2023-09-27 NOTE — Telephone Encounter (Signed)
 Dr. Denver nurse if OOO. Will defer to her for follow up   MyChart message sent to patient

## 2023-09-27 NOTE — Patient Instructions (Signed)
 Edward Cabrera - I am sorry I was unable to reach you today for our scheduled appointment. I work with Celestia Rosaline SQUIBB, NP and am calling to support your healthcare needs. I have rescheduled your appointment for 10/11/23 @ 3 pm.  Please contact me at [phone] at your earliest convenience. I look forward to speaking with you soon.   Thank you,  Haseeb Fiallos, RN, BSN, ACM RN Care Manager [employer] [phone]

## 2023-09-27 NOTE — Telephone Encounter (Signed)
 Patient calling about the paperwork he drop off at the office. Please advise

## 2023-09-30 ENCOUNTER — Other Ambulatory Visit

## 2023-10-01 NOTE — Telephone Encounter (Signed)
 Paperwork received and given to Dr. Lavona 9/29

## 2023-10-03 ENCOUNTER — Telehealth (INDEPENDENT_AMBULATORY_CARE_PROVIDER_SITE_OTHER): Payer: Self-pay | Admitting: Primary Care

## 2023-10-03 NOTE — Telephone Encounter (Signed)
 Copied from CRM [id number]. Topic: Clinical - Medical Advice >> Oct 03, 2023  8:56 AM Anairis L wrote: Reason for CRM: Patient is calling in regarding paperwork that needs to be filled out.He reach out to cardiologist and they told him it was Dr.Edward who needs to fill out paperwork due to stroke. >> Oct 03, 2023  9:02 AM Nurse Lionel B wrote: Patient and provider NOT at this office

## 2023-10-03 NOTE — Telephone Encounter (Signed)
 I spoke to [employer] on a conference call with the patient.  I explained that we received a fax that contained a  Medical Questionnaire.  I also explained that the cardiologist is not going to complete the  Physician Report- Cardiac because the disability is due to a stroke.  Roselle stated it is okay that the cardiologist does not complete the cardiac form. She explained that the PCP can complete the Medical Questionnaire that we received and submit that to [employer].    Roselle said she will notify the examiner that the cardiologist is not completing the Cardiac and the Equilibrium reports, so they will not be submitted. The patient was seen by cardiology 08/09/2023 and she asked that those records be sent however,this clinic only releases records from this clinic, not other clinics for specialist offices.  Roselle said that after they receive the completed Medical Questionnaire, it may take 5 business days to be approved/denied  and if the claim is approved, the patient will also receive back pay.    I explained that the PCP is out of the office and will not be able to address this paperwork until she returns in about 2 weeks.  The patient did not have any further questions at this time.  He is very anxious to have this claim approved.

## 2023-10-03 NOTE — Telephone Encounter (Signed)
 Pt needs a call back to help him understand why our doctor will not fill out forms. He is very frustrated because they sent papers to him to have it filled out by Cardiologist also. Please advise.

## 2023-10-03 NOTE — Telephone Encounter (Signed)
 I returned the call to the patient and he confirmed that the cardiologist would not complete the Physician's Report- Cardiac for his short term disability application because his disability is due to his stroke.  The patient said that he called [employer] and they are sending another form for PCP to complete.  He could not verify if this was a new form or another Attending Physician Statement form, now that cardiology will not be determining patient's limitations.   I explained to him that we may need to call the insurance company later and I will call him to include him on the call

## 2023-10-04 ENCOUNTER — Encounter: Payer: Self-pay | Admitting: Sports Medicine

## 2023-10-04 ENCOUNTER — Ambulatory Visit: Admitting: Sports Medicine

## 2023-10-04 ENCOUNTER — Ambulatory Visit: Admitting: Physician Assistant

## 2023-10-04 ENCOUNTER — Other Ambulatory Visit: Payer: Self-pay

## 2023-10-04 DIAGNOSIS — M25511 Pain in right shoulder: Secondary | ICD-10-CM | POA: Diagnosis not present

## 2023-10-04 DIAGNOSIS — G8929 Other chronic pain: Secondary | ICD-10-CM

## 2023-10-04 DIAGNOSIS — M75121 Complete rotator cuff tear or rupture of right shoulder, not specified as traumatic: Secondary | ICD-10-CM | POA: Diagnosis not present

## 2023-10-04 MED ORDER — METHYLPREDNISOLONE ACETATE 40 MG/ML IJ SUSP
40.0000 mg | INTRAMUSCULAR | Status: AC | PRN
  Administered 2023-10-04: 40 mg via INTRA_ARTICULAR

## 2023-10-04 MED ORDER — BUPIVACAINE HCL 0.25 % IJ SOLN
2.0000 mL | INTRAMUSCULAR | Status: AC | PRN
  Administered 2023-10-04: 2 mL via INTRA_ARTICULAR

## 2023-10-04 MED ORDER — LIDOCAINE HCL 1 % IJ SOLN
2.0000 mL | INTRAMUSCULAR | Status: AC | PRN
  Administered 2023-10-04: 2 mL

## 2023-10-04 NOTE — Progress Notes (Signed)
   Procedure Note  Patient: Edward Cabrera             Date of Birth: 12-06-1960           MRN: [id number]             Visit Date: 10/04/2023  Procedures: Visit Diagnoses:  1. Chronic right shoulder pain    Large Joint Inj: R glenohumeral on 10/04/2023 10:20 AM Indications: pain Details: 22 G 3.5 in needle, ultrasound-guided posterior approach Medications: 2 mL lidocaine  1 %; 2 mL bupivacaine  0.25 %; 40 mg methylPREDNISolone  acetate 40 MG/ML Outcome: tolerated well, no immediate complications  US -guided glenohumeral joint injection, right shoulder After discussion on risks/benefits/indications, informed verbal consent was obtained. A timeout was then performed. The patient was positioned lying lateral recumbent on examination table. The patient's shoulder was prepped with betadine and multiple alcohol swabs and utilizing ultrasound guidance, the patient's glenohumeral joint was identified on ultrasound. Using ultrasound guidance a 22-gauge, 3.5 inch needle with a mixture of 2:2:1 cc's lidocaine :bupivicaine:depomedrol was directed from a lateral to medial direction via in-plane technique into the glenohumeral joint with visualization of appropriate spread of injectate into the joint. Patient tolerated the procedure well without immediate complications.      Procedure, treatment alternatives, risks and benefits explained, specific risks discussed. Consent was given by the patient. Immediately prior to procedure a time out was called to verify the correct patient, procedure, equipment, support staff and site/side marked as required. Patient was prepped and draped in the usual sterile fashion.     - patient tolerated procedure well, discussed post-injection protocol - follow-up with Morna Nadene Cummins as indicated; I am happy to see them as needed - Discussed given his A1c, checking his sugars and evaluating for transient rise, R/B/I discussed. Continue diabetic management and medication  adjustment via his PCP  Lonell Sprang, DO Primary Care Sports Medicine Physician  [facility]  This note was dictated using Dragon naturally speaking software and may contain errors in syntax, spelling, or content which have not been identified prior to signing this note.

## 2023-10-04 NOTE — Progress Notes (Signed)
 Office Visit Note   Patient: Edward Cabrera           Date of Birth: 30-Jan-1960           MRN: [id number] Visit Date: 10/04/2023              Requested by: Celestia Rosaline SQUIBB, NP [address] [place],  [place] [address] PCP: Celestia Rosaline SQUIBB, NP   Assessment & Plan: Visit Diagnoses:  1. Nontraumatic complete tear of right rotator cuff     Plan: Impression is chronic right shoulder pain consistent with rotator cuff pathology and neuropathic pain.  Recent MRI shows chronic full-thickness tears of both the supraspinatus and infraspinatus with moderate fatty atrophy.  At this point, I feel his tears are likely irreparable.  Although I have tried subacromial cortisone injection with the tears being full-thickness, he did not have any relief.  I would like to refer him to Dr. Burnetta to see if he may have any relief following intra-articular cortisone injection.  I would like for him to continue with physical therapy.  If he does not note any improvement, he will follow-up with Dr. Addie for further evaluation and treatment recommendation.    follow-Up Instructions: Return if symptoms worsen or fail to improve.   Orders:  No orders of the defined types were placed in this encounter.  No orders of the defined types were placed in this encounter.     Procedures: No procedures performed   Clinical Data: No additional findings.   Subjective: Chief Complaint  Patient presents with   Right Shoulder - Pain    HPI patient is a pleasant 63 year old gentleman who comes in today to discuss results of his recent right shoulder MRI.  He has been seeing me since this past summer when he initially presented with right shoulder and right upper extremity pain and weakness.  I sent him to the ED at the time where he was diagnosed with a CVA.  He has continued to experience pain and weakness to right upper extremity.  He has been in physical therapy for which she had recently stopped  secondary to the shoulder pain.  I injected his subacromial space about a month ago which did not provide any relief.  He denies having had any recent or remote injury to the right shoulder but does note he has a pretty physical job.  The pain he has is to the entire shoulder radiating into the deltoid.  Forward flexion seems to aggravate his symptoms most.  He has associated weakness.  Recent MRI of the right shoulder shows full-thickness tears of the supraspinatus and infraspinatus with moderate fatty atrophy.  Mild degenerative changes to the [facility] and glenohumeral joints.  Review of Systems as detailed in HPI.  All others reviewed and are negative.   Objective: Vital Signs: There were no vitals taken for this visit.  Physical Exam well-developed and well-nourished gentleman in no acute distress.  Alert and oriented x 3.  Ortho Exam unchanged right shoulder exam  Specialty Comments:  No specialty comments available.  Imaging: No new imaging   PMFS History: Patient Active Problem List   Diagnosis Date Noted   Type 2 diabetes mellitus with complication, without long-term current use of insulin  (HCC) 08/08/2023   LVH (left ventricular hypertrophy) 08/08/2023   Cardiomyopathy (HCC) 08/08/2023   Uncontrolled type 2 diabetes mellitus with hyperglycemia, without long-term current use of insulin  (HCC) 06/07/2023   Essential hypertension 06/07/2023  Hyperlipidemia 06/07/2023   Obesity (BMI 30.0-34.9) 06/07/2023   Acute CVA (cerebrovascular accident) (HCC) 06/05/2023   Past Medical History:  Diagnosis Date   Diabetes mellitus without complication (HCC)    Gout    Hypertension    Pulmonary embolism (HCC)     Family History  Problem Relation Age of Onset   Healthy Mother    Kidney failure Father 68   Diabetes Other     Past Surgical History:  Procedure Laterality Date   ROTATOR CUFF REPAIR Left    Social History   Occupational History   Not on file  Tobacco Use   Smoking  status: Never   Smokeless tobacco: Never  Vaping Use   Vaping status: Never Used  Substance and Sexual Activity   Alcohol use: Not Currently   Drug use: Yes    Types: Marijuana   Sexual activity: Not on file

## 2023-10-05 NOTE — Telephone Encounter (Signed)
 Noted

## 2023-10-05 NOTE — Telephone Encounter (Signed)
 Pt notified of Dr. Denver comment and suggestions via MyChart. Last read by Chesley ONEIDA Pereyra at 5:30PM on 10/04/2023.

## 2023-10-11 ENCOUNTER — Other Ambulatory Visit: Payer: Self-pay | Admitting: *Deleted

## 2023-10-11 NOTE — Patient Instructions (Signed)
 Chesley ONEIDA Pereyra - I am sorry I was unable to reach you today for our scheduled appointment. I work with Celestia Rosaline SQUIBB, NP and am calling to support your healthcare needs.  I have rescheduled your appointment for 11/ 13/25 @ 1:30 pm. Please contact me at [phone] if you need to reschedule.   I look forward to speaking with you soon.   Thank you,  Milani Lowenstein, RN, BSN, ACM RN Care Manager [employer] [phone]

## 2023-10-16 NOTE — Telephone Encounter (Signed)
 Form has been printed and placed in pcp box for completion

## 2023-10-17 ENCOUNTER — Telehealth: Payer: Self-pay

## 2023-10-17 NOTE — Telephone Encounter (Signed)
 Copied from CRM #[id number]. Topic: General - Other >> Oct 17, 2023  9:36 AM Delon DASEN wrote: Reason for CRM: Patient asking of disability paperwork was sent to [employer] yet, please call [phone]

## 2023-10-19 ENCOUNTER — Telehealth (INDEPENDENT_AMBULATORY_CARE_PROVIDER_SITE_OTHER): Payer: Self-pay

## 2023-10-19 NOTE — Telephone Encounter (Signed)
 Received physician report-equilibrium paperwork for pt will be placed in provider box

## 2023-10-22 ENCOUNTER — Telehealth (INDEPENDENT_AMBULATORY_CARE_PROVIDER_SITE_OTHER): Payer: Self-pay | Admitting: Primary Care

## 2023-10-22 NOTE — Telephone Encounter (Signed)
 Error

## 2023-10-22 NOTE — Telephone Encounter (Signed)
 Will forward to provider

## 2023-10-24 ENCOUNTER — Other Ambulatory Visit (INDEPENDENT_AMBULATORY_CARE_PROVIDER_SITE_OTHER): Payer: Self-pay | Admitting: Primary Care

## 2023-11-01 ENCOUNTER — Encounter: Payer: Self-pay | Admitting: Orthopedic Surgery

## 2023-11-01 ENCOUNTER — Ambulatory Visit: Admitting: Orthopedic Surgery

## 2023-11-01 DIAGNOSIS — M75121 Complete rotator cuff tear or rupture of right shoulder, not specified as traumatic: Secondary | ICD-10-CM

## 2023-11-01 DIAGNOSIS — M25511 Pain in right shoulder: Secondary | ICD-10-CM | POA: Diagnosis not present

## 2023-11-01 DIAGNOSIS — G8929 Other chronic pain: Secondary | ICD-10-CM | POA: Diagnosis not present

## 2023-11-01 NOTE — Progress Notes (Signed)
 Office Visit Note   Patient: Edward Cabrera           Date of Birth: [birth year]           MRN: [id number] Visit Date: 11/01/2023 Requested by: Celestia Rosaline SQUIBB, NP [address] [place],  [place] [address] PCP: Celestia Rosaline SQUIBB, NP  Subjective: Chief Complaint  Patient presents with   Right Shoulder - Pain    HPI: Edward Cabrera is a 63 y.o. male who presents to the office reporting right shoulder pain.  Patient had a right glenohumeral joint injection performed 10/04/2023 which gave him no relief.  MRI scan done in September which shows superior migration of the humeral head consistent with chronic rotator cuff pathology.  There is atrophy of the supraspinatus and infraspinatus muscles.  Not too much in terms of glenohumeral joint arthritis.  Patient is right-hand dominant.  Reports relatively constant pain which wakes him from sleep at night.  Does have a history of stroke which occurred on June 3.  Has had right upper extremity weakness since that time.  Also reports decreased range of motion and scapular pain along with significant pain under his axilla.  Having difficulty with balance.  He was working in May overhead with no problems and then had his stroke in June and now his arm is weak and he is having significant shoulder pain.  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                ROS: All systems reviewed are negative as they relate to the chief complaint within the history of present illness.  Patient denies fevers or chills.  Assessment & Plan: Visit Diagnoses:  1. Chronic right shoulder pain   2. Nontraumatic complete tear of right rotator cuff     Plan: Impression is right shoulder pain with some restricted motion as well as generalized arm weakness.  This looks like adhesive capsulitis along with previously asymptomatic rotator cuff arthropathy.  His arm is weak consistent with his stroke.  I do not think a reverse replacement  based on this history is going to be predictably helpful for him.  I do not think we could do it now because of the recent stroke that he has had.  Earliest time for any type of intervention would be January.  Note for out of work for 6 months provided.  Return in mid January at which time we can reassess.  In general he really does not have very much passive range of motion or active range of motion and does have generalized weakness in that right arm with decreased triceps biceps and deltoid strength.  I do not think that he has really fully recovered from the stroke in terms of arm weakness and I am hesitant to consider surgical intervention at this current time without allowing him to fully recover from the stroke.  These rotator cuff tears were present before the stroke and he was very functional during that time.  Follow-Up Instructions: No follow-ups on file.   Orders:  No orders of the defined types were placed in this encounter.  No orders of the defined types were placed in this encounter.     Procedures: No procedures performed   Clinical Data: No additional findings.  Objective: Vital Signs: There were no vitals taken for this visit.  Physical Exam:  Constitutional: Patient appears well-developed HEENT:  Head: Normocephalic Eyes:EOM are normal Neck: Normal  range of motion Cardiovascular: Normal rate Pulmonary/chest: Effort normal Neurologic: Patient is alert Skin: Skin is warm Psychiatric: Patient has normal mood and affect  Ortho Exam: Ortho exam demonstrates generalized weakness in the right arm compared to the left.  This includes grip strength as well as wrist flexion and extension strength which is about 4 out of 5 on the right compared to 5 out of 5 on the left.  Biceps triceps and deltoid strength also about 4 to 4- out of 5 on the right compared to 5 out of 5 on the left.  Patient is very reluctant to allow any passive motion of that right shoulder.  Deltoid does  fire and the shoulder is located.  He states he is having pain with manual abduction more than 45 degrees.  Difficult to really assess the passive range of motion that he has.  Radial pulses intact bilaterally.  Specialty Comments:  No specialty comments available.  Imaging: No results found.   PMFS History: Patient Active Problem List   Diagnosis Date Noted   Type 2 diabetes mellitus with complication, without long-term current use of insulin  (HCC) 08/08/2023   LVH (left ventricular hypertrophy) 08/08/2023   Cardiomyopathy (HCC) 08/08/2023   Uncontrolled type 2 diabetes mellitus with hyperglycemia, without long-term current use of insulin  (HCC) 06/07/2023   Essential hypertension 06/07/2023   Hyperlipidemia 06/07/2023   Obesity (BMI 30.0-34.9) 06/07/2023   Acute CVA (cerebrovascular accident) (HCC) 06/05/2023   Past Medical History:  Diagnosis Date   Diabetes mellitus without complication (HCC)    Gout    Hypertension    Pulmonary embolism (HCC)     Family History  Problem Relation Age of Onset   Healthy Mother    Kidney failure Father 72   Diabetes Other     Past Surgical History:  Procedure Laterality Date   ROTATOR CUFF REPAIR Left    Social History   Occupational History   Not on file  Tobacco Use   Smoking status: Never   Smokeless tobacco: Never  Vaping Use   Vaping status: Never Used  Substance and Sexual Activity   Alcohol use: Not Currently   Drug use: Yes    Types: Marijuana   Sexual activity: Not on file

## 2023-11-05 ENCOUNTER — Encounter: Payer: Self-pay | Admitting: Radiology

## 2023-11-05 ENCOUNTER — Telehealth: Payer: Self-pay | Admitting: Orthopedic Surgery

## 2023-11-05 NOTE — Telephone Encounter (Signed)
 Completed and faxed.

## 2023-11-05 NOTE — Telephone Encounter (Signed)
 Pt submitted medical release form, forms was fax and pt paid $20.00

## 2023-11-07 ENCOUNTER — Telehealth: Payer: Self-pay | Admitting: Orthopedic Surgery

## 2023-11-07 NOTE — Telephone Encounter (Signed)
 Per patients request, wants MRI shoulder report faxed to [facility]. I faxed [phone]

## 2023-11-15 ENCOUNTER — Other Ambulatory Visit: Payer: Self-pay

## 2023-11-15 ENCOUNTER — Encounter: Payer: Self-pay | Admitting: *Deleted

## 2023-11-15 ENCOUNTER — Other Ambulatory Visit: Payer: Self-pay | Admitting: *Deleted

## 2023-11-15 NOTE — Patient Outreach (Signed)
 Complex Care Management   Visit Note  11/15/2023  Name:  Edward Cabrera MRN: [id number] DOB: 07-12-1960  Situation: Referral received for Complex Care Management related to Diabetes, HLD, HTN I obtained verbal consent from Patient.  Visit completed with Patient  on the phone  Background:   Past Medical History:  Diagnosis Date   Diabetes mellitus without complication (HCC)    Gout    Hypertension    Pulmonary embolism (HCC)     Assessment: Patient Reported Symptoms:  Cognitive Cognitive Status: Alert and oriented to person, place, and time, Insightful and able to interpret abstract concepts, Normal speech and language skills, Able to follow simple commands Cognitive/Intellectual Conditions Management [RPT]: None reported or documented in medical history or problem list   Health Maintenance Behaviors: Annual physical exam, Sleep adequate, Healthy diet, Stress management Healing Pattern: Fast Health Facilitated by: Healthy diet, Prayer/meditation, Rest, Stress management  Neurological Neurological Review of Symptoms: Numbness, Weakness Neurological Comment: Patient reports that he continues to have weakness in his right hand and arm.  Patient reports that he has paused OT at this time due to having 2 tear rotator cuff.  He reports that orthopedic is following and will need surgical repair in January 2026.  HEENT HEENT Symptoms Reported: No symptoms reported HEENT Management Strategies: Routine screening, Coping strategies HEENT Self-Management Outcome: 4 (good)    Cardiovascular Does patient have uncontrolled Hypertension?: Yes Is patient checking Blood Pressure at home?: Yes Patient's Recent BP reading at home: Patient reports that he takes his blood pressure every other day.  Patient reports that his blood pressure on 11/14/23 was 140/85.  He informs me that he is watching his diet. Cardiovascular Management Strategies: Routine screening, Adequate rest Cardiovascular  Self-Management Outcome: 4 (good)  Respiratory Respiratory Symptoms Reported: No symptoms reported Respiratory Management Strategies: Adequate rest, Routine screening Respiratory Self-Management Outcome: 4 (good)  Endocrine Endocrine Symptoms Reported: No symptoms reported Is patient diabetic?: Yes Is patient checking blood sugars at home?: No Endocrine Self-Management Outcome: 3 (uncertain) Endocrine Comment: Patient reports that he continues to wait on a prescription to be sent in his pharmacy for a blood glucose machine.  I have sent doctor a note inform reports that he has not received his blood glcuose from his pharmacy.  Gastrointestinal Gastrointestinal Symptoms Reported: Constipation Additional Gastrointestinal Details: Reports that constipation is better.  Patient reports that he takes a laxative as needed.  He reports that he has increased his water and fruits and vegatables. Gastrointestinal Management Strategies: Medication therapy, Diet modification, Adequate rest Gastrointestinal Self-Management Outcome: 3 (uncertain)    Genitourinary Genitourinary Symptoms Reported: No symptoms reported Genitourinary Management Strategies: Adequate rest, Exercise, Activity Genitourinary Self-Management Outcome: 4 (good)  Integumentary Integumentary Symptoms Reported: No symptoms reported Skin Management Strategies: Routine screening Skin Self-Management Outcome: 4 (good)  Musculoskeletal Musculoskelatal Symptoms Reviewed: Weakness, Unsteady gait, Joint pain Additional Musculoskeletal Details: Patient reports he has 2 tore rotator cuff.  Patient reports that he is followed by Othropedics.  He reports that he will have surgical repair in January.  He reports that he uses Tylenol  as needed to help with discomfort.  He reports no pain at this time.  Patient had recent stroke 4 months ago and has weakness and unsteady gait  due to right side weakness.  Patient reports that weakness is getting better.   Patient reports that he uses a cane on occasion to assis with unsteady gait. Musculoskeletal Management Strategies: Routine screening, Medication therapy, Medical device, Exercise, Coping strategies, Adequate rest,  Activity Musculoskeletal Self-Management Outcome: 3 (uncertain) Falls in the past year?: No Number of falls in past year: 1 or less Was there an injury with Fall?: No Fall Risk Category Calculator: 0 Patient Fall Risk Level: Low Fall Risk Patient at Risk for Falls Due to: Impaired balance/gait, Impaired mobility, Orthopedic patient Fall risk Follow up: Falls evaluation completed, Education provided, Falls prevention discussed, Follow up appointment (Patient reports falls since last outreach.)  Psychosocial Psychosocial Symptoms Reported: No symptoms reported Behavioral Management Strategies: Support system Behavioral Health Self-Management Outcome: 4 (good)   Quality of Family Relationships: helpful, involved, supportive Do you feel physically threatened by others?: No    11/15/2023    PHQ2-9 Depression Screening   Little interest or pleasure in doing things Not at all  Feeling down, depressed, or hopeless Not at all  PHQ-2 - Total Score 0  Trouble falling or staying asleep, or sleeping too much    Feeling tired or having little energy    Poor appetite or overeating     Feeling bad about yourself - or that you are a failure or have let yourself or your family down    Trouble concentrating on things, such as reading the newspaper or watching television    Moving or speaking so slowly that other people could have noticed.  Or the opposite - being so fidgety or restless that you have been moving around a lot more than usual    Thoughts that you would be better off dead, or hurting yourself in some way    PHQ2-9 Total Score    If you checked off any problems, how difficult have these problems made it for you to do your work, take care of things at home, or get along with other  people    Depression Interventions/Treatment      Vitals:   11/14/23 1419  BP: (!) 140/85   Pain Scale: 0-10 Pain Score: 0-No pain  Medications Reviewed Today     Reviewed by Jorja Nichole LABOR, RN (Case Manager) on 11/15/23 at 1346  Med List Status: <None>   Medication Order Taking? Sig Documenting Provider Last Dose Status Informant  amLODipine  (NORVASC ) 10 MG tablet [id number] Yes Take 1 tablet (10 mg total) by mouth daily. Celestia Rosaline SQUIBB, NP  Active Self, Pharmacy Records  aspirin  EC 81 MG tablet [id number] Yes Take 1 tablet (81 mg total) by mouth daily. Swallow whole. Darci Pore, MD  Active   atorvastatin  (LIPITOR ) 80 MG tablet [id number] Yes Take 1 tablet (80 mg total) by mouth daily. Darci Pore, MD  Active   Blood Glucose Monitoring Suppl DEVI [id number]  1 each by Does not apply route in the morning, at noon, and at bedtime. May substitute to any manufacturer covered by patient's insurance.  Patient not taking: Reported on 08/14/2023   Celestia Rosaline SQUIBB, NP  Active   clopidogrel  (PLAVIX ) 75 MG tablet [id number] Yes Take 1 tablet (75 mg total) by mouth daily. Darci Pore, MD  Active   glimepiride  (AMARYL ) 2 MG tablet [id number] Yes Take 1 tablet (2 mg total) by mouth every morning. Darci Pore, MD  Active   Omega-3 Fatty Acids (ULTRA OMEGA-3 FISH OIL ) 1400 MG CAPS [id number] Yes Take 2,800 mg by mouth daily. [provider]  Active Self, Pharmacy Records  tadalafil  (CIALIS ) 10 MG tablet [id number] Yes TAKE 1 TABLET BY MOUTH EVERY OTHER DAY AS NEEDED FOR ERECTILE DYSFUNCTION Celestia Rosaline SQUIBB, NP  Active   valsartan -hydrochlorothiazide  (DIOVAN -HCT) 160-25 MG tablet [id number]  Yes Take 1 tablet by mouth daily. Newlin, Enobong, MD  Active             Recommendation:   PCP Follow-up Continue Current Plan of Care  Follow Up Plan:   Telephone follow-up in 1 month: 12/20/23 @ 3:30 pm  Keighley Deckman, RN, BSN, ACM RN Care  Manager [employer] [phone]

## 2023-11-15 NOTE — Patient Instructions (Signed)
 Visit Information  Thank you for taking time to visit with me today. Please don't hesitate to contact me if I can be of assistance to you before our next scheduled appointment.  Your next care management appointment is by telephone on 12/20/23 at 3:30pm  Please call the care guide team at [phone] if you need to cancel, schedule, or reschedule an appointment.   Please call the Suicide and Crisis Lifeline: 988 call the [place]  National Suicide Prevention Lifeline: 938 405 3937 or TTY: (762) 444-9872 TTY 661-077-0709) to talk to a trained counselor call 1-800-273-TALK (toll free, 24 hour hotline) go to [facility] [address], [place] [phone]) call the [facility] Crisis Line: [phone] call 911 if you are experiencing a Mental Health or Behavioral Health Crisis or need someone to talk to.  Shawnte Winton, RN, BSN, ACM RN Care Manager [facility] Population [facility] [phone]    Visit Information  Thank you for taking time to visit with me today. Please don't hesitate to contact me if I can be of assistance to you before our next scheduled appointment.  Your next care management appointment is by telephone on 12/20/23  at 3:30 pm  Telephone follow-up in 1 month  Please call the care guide team at [phone] if you need to cancel, schedule, or reschedule an appointment.   Please call the Suicide and Crisis Lifeline: 988 call the [place]  National Suicide Prevention Lifeline: (928)581-3945 or TTY: 2158398245 TTY 385-129-6508) to talk to a trained counselor call 1-800-273-TALK (toll free, 24 hour hotline) go to [facility] [address] [place] [phone]) call the [facility] Crisis Line: ([phone] call 911 if you are experiencing a Mental Health or Behavioral Health Crisis or need someone to talk to.  Karmella Bouvier, RN, BSN, Theatre Manager [employer] [phone]

## 2023-11-20 ENCOUNTER — Other Ambulatory Visit (INDEPENDENT_AMBULATORY_CARE_PROVIDER_SITE_OTHER): Payer: Self-pay | Admitting: Primary Care

## 2023-11-23 NOTE — Telephone Encounter (Signed)
 Requested Prescriptions  Pending Prescriptions Disp Refills   tadalafil  (CIALIS ) 10 MG tablet [Pharmacy Med Name: Tadalafil  10 MG Oral Tablet] 10 tablet 0    Sig: TAKE 1 TABLET BY MOUTH EVERY OTHER DAY AS NEEDED FOR ERECTILE DYSFUNCTION     Urology: Erectile Dysfunction Agents Failed - 11/23/2023  9:57 AM      Failed - Last BP in normal range    BP Readings from Last 1 Encounters:  11/14/23 (!) 140/85         Passed - AST in normal range and within 360 days    AST  Date Value Ref Range Status  06/29/2023 20 0 - 40 IU/L Final         Passed - ALT in normal range and within 360 days    ALT  Date Value Ref Range Status  06/29/2023 30 0 - 44 IU/L Final         Passed - Valid encounter within last 12 months    Recent Outpatient Visits           2 months ago Hemiplegia and hemiparesis following cerebral infarction affecting right dominant side [facility])   [facility] Celestia Rosaline SQUIBB, NP   2 months ago Essential hypertension   [facility] Morris, Garnette CROME, RPH-CPP   4 months ago Essential hypertension   [facility] Fleeta Morris, Garnette CROME, RPH-CPP   5 months ago Essential hypertension   [facility] Fleeta Morris Garnette L, RPH-CPP   5 months ago OSA (obstructive sleep apnea)   [facility] Celestia Rosaline SQUIBB, NP

## 2023-11-28 NOTE — Progress Notes (Signed)
 [facility] Worker Note Stroke Post Discharge Follow-Up  Edward Cabrera [id number]   Contact Type:  Telephone Encounter Date: 11/23/2023  Outreach Project:  Stroke post discharge follow-up Managed Medicaid Plan Participant:   PCP: Yes - See Care Teams in patient chart Payor Status: Does the patient have health insurance (Y/N): Yes Payor Name: : [employer] Worker Documentation     Row Name 11/27/23 1201     Post-Stroke CHW Follow-Up Telephone Call   Discharge Date 06/07/23   Discharge Location [facility]   How have you been feeling since being released from the hospital? N/A   Program RN notified of new or worsening symptoms N/A   Depression Screening Exception: Other- indicate reason in comment box  Unable to contact patient     Functional Questionnaire - ADL's   Bathing Dependent  Confirmed per chart review   Dressing Independent   Meal Prep Dependent   Eating Dependent   Maintaining Continence Independent   Ambulation/Transferring Independent   Medication Management Independent   Does the patient have support for these ADL's Yes  Per chart review living with mother who is assisting     Post-Discharge Instructions Reviewed   Did the patient receive and understand the discharge instructions provided? Yes  Per discharge notes   Did the patient obtain their post-discharge medications? Yes  Per refill history in chart   Does the patient have any questions related to side effects, education/health literacy? --  Unable to contact patient     Follow-Up Appointments Review   PCP Hospital F/U appointment scheduled? Yes   Did the patient keep PCP discharge appointment? Yes   Specialist Hospital F/U appointment scheduled? Yes  Cardiology appt on 07/17/2023   Did the patient keep the specialist appointment Yes   Did the patient have referral(s) to PT/OT/SLP post discharge? OT   Has the patient been given appointments for the referrals? Yes    Did the patient keep their PT/OT/SLP appointment(s)? Yes     Risk Factor Follow-Up   Is the pateint diabetic? Yes   Does the patient have the medications/tools needed to manage their diabetes? Yes   Does the patient have hypertension? Yes   Does the patient have the medications/tools needed to manage their hypertension? Yes   Does patient smoke, use tobacco product, and/or vape?  Yes  Patient smokes Marijuana per social history that filled out on 11/13 encounter   Does the patient have nutritional needs/restrictions? No   Does the patient exercise IF recommended by the discharge care team? --  Unable to contact pt to confirm   Can the patient verbalize understanding of the actions needed to manage their current condition? Yes   Can the patient verbalize actions needed to address his/her current risk factors to prevent another stroke?  Yes   Would the patient like helping finding additional community resources and/or stroke support groups? --  Unable to contact pt       Past Medical History:  Diagnosis Date   Diabetes mellitus without complication (HCC)    Gout    Hypertension    Pulmonary embolism (HCC)    Social History   Substance and Sexual Activity  Alcohol Use Not Currently   Social History   Substance and Sexual Activity  Drug Use Yes   Types: Marijuana   Social History   Tobacco Use  Smoking Status Never  Smokeless Tobacco Never    SDOH Screenings   Food Insecurity:  No Food Insecurity (11/15/2023)  Housing: Low Risk  (11/15/2023)  Transportation Needs: No Transportation Needs (11/15/2023)  Utilities: Not At Risk (11/15/2023)  Depression (PHQ2-9): Low Risk  (11/15/2023)  Social Connections: Unknown (06/06/2023)  Tobacco Use: Low Risk  (11/15/2023)   Referrals (if applicable):        Education: N/A  Limiting Factors:  Unable to contact patient   Follow-up:   No follow up needed per health equity protocol due to patient having an ongoing follow up with  [facility]  Follow-up Type:  Telephone/ Chart Review    Chesnee Floren A Lister Brizzi

## 2023-12-20 ENCOUNTER — Telehealth: Admitting: *Deleted

## 2024-01-11 ENCOUNTER — Other Ambulatory Visit (INDEPENDENT_AMBULATORY_CARE_PROVIDER_SITE_OTHER): Payer: Self-pay | Admitting: Primary Care

## 2024-01-11 DIAGNOSIS — Z760 Encounter for issue of repeat prescription: Secondary | ICD-10-CM

## 2024-01-11 DIAGNOSIS — I10 Essential (primary) hypertension: Secondary | ICD-10-CM

## 2024-01-11 NOTE — Telephone Encounter (Signed)
 Last bp was 151 /111  Providers Discretion

## 2024-01-16 ENCOUNTER — Ambulatory Visit: Admitting: Orthopedic Surgery

## 2024-01-16 ENCOUNTER — Encounter: Payer: Self-pay | Admitting: Orthopedic Surgery

## 2024-01-16 DIAGNOSIS — M25511 Pain in right shoulder: Secondary | ICD-10-CM

## 2024-01-16 DIAGNOSIS — G8929 Other chronic pain: Secondary | ICD-10-CM | POA: Diagnosis not present

## 2024-01-16 NOTE — Progress Notes (Signed)
 "  Office Visit Note   Patient: Edward Cabrera           Date of Birth: [birth year]           MRN: [id number] Visit Date: 01/16/2024 Requested by: Celestia Rosaline SQUIBB, NP [address] [place],  [place] [address] PCP: Celestia Rosaline SQUIBB, NP  Subjective: Chief Complaint  Patient presents with   Right Shoulder - Pain    HPI: Edward Cabrera is a 64 y.o. male who presents to the office reporting right shoulder pain.  Patient had glenohumeral joint injection for arthritis in October of last year.  Has a history of stroke in June of last year.  The stroke that affected his right hand side.  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  He is now able to hold his phone which he was not able to do a month or 2 ago.  Does report both pain and weakness on that right-hand side.  His shoulder and arm are getting better but he still cannot write.  He does report some decreased grip strength..                ROS: All systems reviewed are negative as they relate to the chief complaint within the history of present illness.  Patient denies fevers or chills.  Assessment & Plan: Visit Diagnoses:  1. Chronic right shoulder pain     Plan: Impression is improvement in right shoulder and arm.  We need to hold off on surgical intervention at this time but as he is improving and I do not think he has plateaued yet.  I do think he is okay to return to work with no lifting more than 40 pounds floor to waist as well as waist to shoulder and no overhead lifting more than 10 pounds for the next 12 months.  He will follow-up with us  as needed.  Could consider 1 or 2 more injections this year if his symptoms warrant.  Follow-Up Instructions: No follow-ups on file.   Orders:  No orders of the defined types were placed in this encounter.  No orders of the defined types were placed in this encounter.     Procedures: No procedures performed   Clinical Data: No additional  findings.  Objective: Vital Signs: There were no vitals taken for this visit.  Physical Exam:  Constitutional: Patient appears well-developed HEENT:  Head: Normocephalic Eyes:EOM are normal Neck: Normal range of motion Cardiovascular: Normal rate Pulmonary/chest: Effort normal Neurologic: Patient is alert Skin: Skin is warm Psychiatric: Patient has normal mood and affect  Ortho Exam: Ortho exam demonstrates active forward flexion to about 90 degrees on the right.  His rotator cuff strength is improving with internal/external rotation at about 5 out of 5.  Deltoid is functional.  Does have diminished grip strength on the right compared to the left.  Otherwise exam is unchanged.  Specialty Comments:  No specialty comments available.  Imaging: No results found.   PMFS History: Patient Active Problem List   Diagnosis Date Noted   Type 2 diabetes mellitus with complication, without long-term current use of insulin  (HCC) 08/08/2023   LVH (left ventricular hypertrophy) 08/08/2023   Cardiomyopathy (HCC) 08/08/2023   Uncontrolled type 2 diabetes mellitus with hyperglycemia, without long-term current use of insulin  (HCC) 06/07/2023   Essential hypertension 06/07/2023   Hyperlipidemia 06/07/2023   Obesity (BMI 30.0-34.9) 06/07/2023   Acute CVA (cerebrovascular accident) (HCC) 06/05/2023   Past  Medical History:  Diagnosis Date   Diabetes mellitus without complication (HCC)    Gout    Hypertension    Pulmonary embolism (HCC)     Family History  Problem Relation Age of Onset   Healthy Mother    Kidney failure Father 31   Diabetes Other     Past Surgical History:  Procedure Laterality Date   ROTATOR CUFF REPAIR Left    Social History   Occupational History   Not on file  Tobacco Use   Smoking status: Never   Smokeless tobacco: Never  Vaping Use   Vaping status: Never Used  Substance and Sexual Activity   Alcohol use: Not Currently   Drug use: Yes    Types:  Marijuana   Sexual activity: Not on file        "

## 2024-01-23 ENCOUNTER — Encounter: Payer: Self-pay | Admitting: Cardiology

## 2024-01-24 ENCOUNTER — Telehealth: Payer: Self-pay | Admitting: *Deleted

## 2024-01-24 ENCOUNTER — Telehealth: Payer: Self-pay | Admitting: Cardiology

## 2024-01-24 NOTE — Telephone Encounter (Signed)
 Spoke with pt and let him know that Dr. Lavona signed a letter for him to return to work and that the letter has been faxed to the number provided. Pt verbalized understanding. All questions if any were answered.

## 2024-01-24 NOTE — Telephone Encounter (Signed)
 Patient wants a return-to-work letter sent to his employer [employer] at fax number [phone].

## 2024-01-25 NOTE — Telephone Encounter (Signed)
 See telephone call from 1/22

## 2024-01-29 ENCOUNTER — Telehealth (INDEPENDENT_AMBULATORY_CARE_PROVIDER_SITE_OTHER): Payer: Self-pay | Admitting: Primary Care

## 2024-01-29 NOTE — Telephone Encounter (Signed)
 Called pt to confirm appt. Pt did not answer and LVM

## 2024-01-30 ENCOUNTER — Encounter (INDEPENDENT_AMBULATORY_CARE_PROVIDER_SITE_OTHER): Payer: Self-pay

## 2024-01-30 ENCOUNTER — Ambulatory Visit (INDEPENDENT_AMBULATORY_CARE_PROVIDER_SITE_OTHER): Payer: Self-pay | Admitting: Primary Care

## 2024-02-07 ENCOUNTER — Telehealth (INDEPENDENT_AMBULATORY_CARE_PROVIDER_SITE_OTHER): Payer: Self-pay | Admitting: Primary Care

## 2024-02-07 ENCOUNTER — Ambulatory Visit: Payer: Self-pay

## 2024-02-07 NOTE — Telephone Encounter (Signed)
 Per nurse Damien called and stated pt refused to go to the ED. Please advise.

## 2024-02-07 NOTE — Telephone Encounter (Signed)
 FYI Only or Action Required?: Action required by provider: request for appointment, clinical question for provider, and update on patient condition.  Patient was last seen in primary care on 09/06/2023 by Celestia Rosaline SQUIBB, NP.  Called Nurse Triage reporting Dizziness and Chest Pain.  Symptoms began several months ago.  Interventions attempted: Nothing.  Symptoms are: random and severe.  Triage Disposition: Go to ED Now (or PCP Triage)  Patient/caregiver understands and will follow disposition?: No, refuses disposition      Reason for Disposition  Patient sounds very sick or weak to the triager  Answer Assessment - Initial Assessment Questions Pt is poor historian. This RN recommended pt be examined in hospital per symptoms reported to occur severely and at random, pt refusing at this time, requesting PCP appt to make up for appt on 1/28 for which pt left after waiting 2 hours for appt. Advised pt get to hospital asap if any new or worsening symptoms. Sending message to PCP office for call back to pt with further recommendations. Alerted CAL to ED refusal.     1. LOCATION: Where does it hurt?       Sometimes in middle of chest, mostly right side of chest  3. ONSET: When did the chest pain begin? (Minutes, hours or days)      Years, not talked with docs about it, last experienced yesterday  4. PATTERN: Does the pain come and go, or has it been constant since it started?  Does it get worse with exertion?      Intermittent, requires deep breathing to calm it down  5. DURATION: How long does it last (e.g., seconds, minutes, hours)     Couple minutes  6. SEVERITY: How bad is the pain?  (e.g., Scale 1-10; mild, moderate, or severe)     7.5/10 Feels like a cramp confirms like pressure  7. CARDIAC RISK FACTORS: Do you have any history of heart problems or risk factors for heart disease? (e.g., angina, prior heart attack; diabetes, high blood pressure, high  cholesterol, smoker, or strong family history of heart disease)     Significant  10. OTHER SYMPTOMS: Do you have any other symptoms? (e.g., dizziness, nausea, vomiting, sweating, fever, difficulty breathing, cough)       Since stroke in June 2025 - dizziness intermittently, SOB intermittently - states he gets out of breath more easily, still numbness in right arm/hand Dizziness - get out of balance, not gotten more frequent/intense Wake up in middle of night with SOB, unsure if struggling for breath but have to take deep breaths at that point per pt Seeing neuro on 2/10  Denies: Current symptoms Struggling to breathe Cold/clammy skin, too weak to stand Passing out Chest pain over 5 min long New numbness Weakness Heart racing at rest  Protocols used: Chest Pain-A-AH

## 2024-02-12 ENCOUNTER — Ambulatory Visit: Admitting: Cardiology

## 2024-02-13 ENCOUNTER — Ambulatory Visit (INDEPENDENT_AMBULATORY_CARE_PROVIDER_SITE_OTHER): Payer: Self-pay | Admitting: Primary Care
